# Patient Record
Sex: Female | Born: 1950
Health system: Southern US, Community
[De-identification: ages and names within clinical notes are randomized; demographics above are authoritative.]

## PROBLEM LIST (undated history)

## (undated) DIAGNOSIS — K449 Diaphragmatic hernia without obstruction or gangrene: Secondary | ICD-10-CM

## (undated) DIAGNOSIS — Z923 Personal history of irradiation: Secondary | ICD-10-CM

## (undated) DIAGNOSIS — K2970 Gastritis, unspecified, without bleeding: Secondary | ICD-10-CM

## (undated) DIAGNOSIS — E039 Hypothyroidism, unspecified: Secondary | ICD-10-CM

## (undated) DIAGNOSIS — G4730 Sleep apnea, unspecified: Secondary | ICD-10-CM

## (undated) DIAGNOSIS — D139 Benign neoplasm of ill-defined sites within the digestive system: Secondary | ICD-10-CM

## (undated) DIAGNOSIS — F32A Depression, unspecified: Secondary | ICD-10-CM

## (undated) DIAGNOSIS — N951 Menopausal and female climacteric states: Secondary | ICD-10-CM

## (undated) DIAGNOSIS — K5730 Diverticulosis of large intestine without perforation or abscess without bleeding: Secondary | ICD-10-CM

## (undated) DIAGNOSIS — F329 Major depressive disorder, single episode, unspecified: Secondary | ICD-10-CM

## (undated) DIAGNOSIS — E669 Obesity, unspecified: Secondary | ICD-10-CM

## (undated) DIAGNOSIS — J42 Unspecified chronic bronchitis: Secondary | ICD-10-CM

## (undated) DIAGNOSIS — K2270 Barrett's esophagus without dysplasia: Secondary | ICD-10-CM

## (undated) DIAGNOSIS — I10 Essential (primary) hypertension: Secondary | ICD-10-CM

## (undated) DIAGNOSIS — K589 Irritable bowel syndrome without diarrhea: Secondary | ICD-10-CM

## (undated) DIAGNOSIS — M8580 Other specified disorders of bone density and structure, unspecified site: Secondary | ICD-10-CM

## (undated) DIAGNOSIS — K219 Gastro-esophageal reflux disease without esophagitis: Secondary | ICD-10-CM

## (undated) DIAGNOSIS — E0500 Thyrotoxicosis with diffuse goiter without thyrotoxic crisis or storm: Secondary | ICD-10-CM

## (undated) HISTORY — DX: Other specified disorders of bone density and structure, unspecified site: M85.80

## (undated) HISTORY — DX: Obesity, unspecified: E66.9

## (undated) HISTORY — DX: Diaphragmatic hernia without obstruction or gangrene: K44.9

## (undated) HISTORY — DX: Menopausal and female climacteric states: N95.1

## (undated) HISTORY — DX: Irritable bowel syndrome, unspecified: K58.9

## (undated) HISTORY — PX: CHOLECYSTECTOMY: SHX55

## (undated) HISTORY — DX: Major depressive disorder, single episode, unspecified: F32.9

## (undated) HISTORY — DX: Essential (primary) hypertension: I10

## (undated) HISTORY — DX: Depression, unspecified: F32.A

## (undated) HISTORY — PX: TONSILLECTOMY: SUR1361

## (undated) HISTORY — PX: MOHS SURGERY: SHX181

## (undated) HISTORY — DX: Gastro-esophageal reflux disease without esophagitis: K21.9

## (undated) HISTORY — PX: ABDOMINAL HYSTERECTOMY: SHX81

## (undated) HISTORY — DX: Thyrotoxicosis with diffuse goiter without thyrotoxic crisis or storm: E05.00

## (undated) HISTORY — PX: INCONTINENCE SURGERY: SHX676

## (undated) HISTORY — DX: Sleep apnea, unspecified: G47.30

## (undated) HISTORY — DX: Hypothyroidism, unspecified: E03.9

## (undated) HISTORY — DX: Benign neoplasm of ill-defined sites within the digestive system: D13.9

## (undated) HISTORY — DX: Diverticulosis of large intestine without perforation or abscess without bleeding: K57.30

## (undated) HISTORY — DX: Barrett's esophagus without dysplasia: K22.70

## (undated) HISTORY — DX: Gastritis, unspecified, without bleeding: K29.70

---

## 1999-02-08 ENCOUNTER — Other Ambulatory Visit: Admission: RE | Admit: 1999-02-08 | Discharge: 1999-02-08 | Payer: Self-pay | Admitting: Gastroenterology

## 1999-02-08 ENCOUNTER — Encounter (INDEPENDENT_AMBULATORY_CARE_PROVIDER_SITE_OTHER): Payer: Self-pay

## 2000-04-02 ENCOUNTER — Other Ambulatory Visit: Admission: RE | Admit: 2000-04-02 | Discharge: 2000-04-02 | Payer: Self-pay | Admitting: Family Medicine

## 2000-05-18 ENCOUNTER — Encounter: Admission: RE | Admit: 2000-05-18 | Discharge: 2000-05-30 | Payer: Self-pay | Admitting: Family Medicine

## 2001-09-26 ENCOUNTER — Encounter: Payer: Self-pay | Admitting: Urology

## 2001-09-26 ENCOUNTER — Encounter: Admission: RE | Admit: 2001-09-26 | Discharge: 2001-09-26 | Payer: Self-pay | Admitting: Urology

## 2001-10-01 ENCOUNTER — Ambulatory Visit (HOSPITAL_BASED_OUTPATIENT_CLINIC_OR_DEPARTMENT_OTHER): Admission: RE | Admit: 2001-10-01 | Discharge: 2001-10-01 | Payer: Self-pay | Admitting: Urology

## 2002-02-26 ENCOUNTER — Other Ambulatory Visit: Admission: RE | Admit: 2002-02-26 | Discharge: 2002-02-26 | Payer: Self-pay | Admitting: Family Medicine

## 2003-01-29 ENCOUNTER — Encounter (HOSPITAL_COMMUNITY): Admission: RE | Admit: 2003-01-29 | Discharge: 2003-04-29 | Payer: Self-pay | Admitting: Endocrinology

## 2007-04-16 ENCOUNTER — Ambulatory Visit: Payer: Self-pay | Admitting: Gastroenterology

## 2007-04-16 LAB — CONVERTED CEMR LAB
AST: 22 units/L (ref 0–37)
Albumin: 3.6 g/dL (ref 3.5–5.2)
BUN: 9 mg/dL (ref 6–23)
Basophils Absolute: 0.1 10*3/uL (ref 0.0–0.1)
Calcium: 9.8 mg/dL (ref 8.4–10.5)
Creatinine, Ser: 1.1 mg/dL (ref 0.4–1.2)
Eosinophils Absolute: 0.4 10*3/uL (ref 0.0–0.6)
Ferritin: 16.8 ng/mL (ref 10.0–291.0)
Folate: 15.2 ng/mL
GFR calc Af Amer: 66 mL/min
GFR calc non Af Amer: 54 mL/min
HCT: 40.8 % (ref 36.0–46.0)
Hemoglobin: 13.2 g/dL (ref 12.0–15.0)
Iron: 51 ug/dL (ref 42–145)
MCHC: 32.4 g/dL (ref 30.0–36.0)
Monocytes Absolute: 0.6 10*3/uL (ref 0.2–0.7)
Neutro Abs: 4.5 10*3/uL (ref 1.4–7.7)
Platelets: 377 10*3/uL (ref 150–400)
RDW: 13.4 % (ref 11.5–14.6)
Saturation Ratios: 13.5 % — ABNORMAL LOW (ref 20.0–50.0)
Total Bilirubin: 0.5 mg/dL (ref 0.3–1.2)
Transferrin: 270.1 mg/dL (ref >=212.0)
Vitamin B-12: 97 pg/mL — ABNORMAL LOW (ref 211–911)

## 2007-04-29 ENCOUNTER — Ambulatory Visit: Payer: Self-pay | Admitting: Gastroenterology

## 2007-04-29 ENCOUNTER — Encounter: Payer: Self-pay | Admitting: Gastroenterology

## 2007-04-29 DIAGNOSIS — D139 Benign neoplasm of ill-defined sites within the digestive system: Secondary | ICD-10-CM

## 2007-04-29 DIAGNOSIS — D1399 Benign neoplasm of ill-defined sites within the digestive system: Secondary | ICD-10-CM

## 2007-04-29 HISTORY — DX: Benign neoplasm of ill-defined sites within the digestive system: D13.99

## 2007-04-29 HISTORY — DX: Benign neoplasm of ill-defined sites within the digestive system: D13.9

## 2010-06-07 NOTE — Assessment & Plan Note (Signed)
[facility]                         GASTROENTEROLOGY OFFICE NOTE   HA, SHANNAHAN                         MRN:          [id number]  DATE:04/16/2007                            DOB:          1950/05/15    Mrs. Artist is a 60 year old African American female referred for  evaluation of chronic reflux symptoms, constipation predominant  irritable bowel syndrome.   I have known Mrs. Paris for many years and she last had colonoscopy in  2001, which was unremarkable.  She has had chronic IBS with alternating  diarrhea and constipation, constipation predominant.  She has also had  chronic GERD and a history of Barrett's mucosa with a small focus of  dysplasia in 1998.  She has been on chronic PPI therapy, but is not  taking these at this time for reasons which were unclear.  Last  endoscopic exam was in August of 2003 and was fairly unremarkable.  She  did have a small hyperplastic gastric polyp removed at that time.   She complains of some gas and bloating, and constipation followed by  diarrhea after several days.  She really has minimal abdominal pain and  denies nausea, vomiting, anorexia or weight loss.  She has rather  typical acid reflux both daytime and nighttime with burning and  regurgitation, but denies true dysphagia.  She is status post  cholecystectomy.  She denies any specific food intolerances,  particularly lactose.   Another problem today is what sounds like proctalgia fugax with  recurrent, rather severe, rectal pain, which wakes her up from sleep at  night.  This is very seldom, but is rather severe.   PAST MEDICAL HISTORY:  Remarkable for hypertension, chronic thyroid  dysfunction, degenerative Arthritis, chronic anxiety syndrome, sleep  apnea.  She has had previous cholecystectomy, hysterectomy and tubal  ligation.   MEDICATIONS:  1. Atenolol 25 mg daily.  2. Hydrochlorothiazide 25 mg daily.  3. Aspirin 81 mg daily.  4. Vitamin  D daily.  5. Omega 3 daily.  6. She does use p.r.n. lorazepam 0.5 mg.  7. Aleve.   She denies drug allergies.   FAMILY HISTORY:  Noncontributory in terms of no gastrointestinal  problems.   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  She  does not smoke or abuse ethanol.   REVIEW OF SYSTEMS:  Noncontributory without current cardiovascular,  pulmonary, genitourinary, or neuropsychiatric problems.   EXAMINATION:  Shows her to be a health appearing female, appearing her  stated age, in no acute distress.  She is 5 feet 3 inches and weighs 206  pounds.  Blood pressure is 102/66 and pulse was 64, regular.  I could  not appreciate the stigmata of chronic liver disease, thyromegaly or  lymphadenopathy.  Her chest was entirely clear.  She was in a regular  rhythm without murmurs, gallops or rubs.  There was no organomegaly,  abdominal masses or tenderness.  Bowel sounds were normal.  Peripheral  extremities were unremarkable.  Inspection of the rectum  was  unremarkable as was rectal exam.  I could not appreciate any rectal  fissures or fistulae.  There was soft stool present that was guaiac  negative.   ASSESSMENT:  1. Chronic gastroesophageal reflux disease with history of possible      Barrett's mucosa in her esophagus with need for followup endoscopy.  2. Status post cholecystectomy.  3. Constipation predominant irritable bowel syndrome.  4. Hypertensive cardiovascular disease.  5. History of chronic thyroid disorder not on thyroid replacement      therapy.  6. Status post hysterectomy.   RECOMMENDATIONS:  1. Reflux regime and I have restarted Protonix 40 mg 30 minutes before      breakfast each morning.  2. Followup endoscopy and colonoscopy.  3. Benefiber 1 tablespoon with cereal in the morning and liberal p.o.      fluids.  4. Consider trial of Amitiza 8 mcg twice daily with  meals.  5. Continue other medications per Dr. Tania Ade.   ADDENDUM  Lab test on March 30, 2006 showed a normal CBC, metabolic profile, and  thyroid function tests.     Vania Rea. Jarold Motto, MD, Caleen Essex, FAGA  Electronically Signed    DRP/MedQ  DD: 04/16/2007  DT: 04/16/2007  Job #: [id number]   cc:   Belva Agee, Dr.

## 2010-06-10 NOTE — Op Note (Signed)
TNAME:  REDDAmir, Glaus                           ACCOUNT NO.:  [id number]   MEDICAL RECORD NO.:  [id number]                   PATIENT TYPE:  AMB   LOCATION:  [facility]                                 FACILITY:  [facility]   PHYSICIAN:  Excell Seltzer. Annabell Howells, M.D.                 DATE OF BIRTH:  [birth year]   DATE OF PROCEDURE:  10/01/2001  DATE OF DISCHARGE:                                 OPERATIVE REPORT   PROCEDURE:  SPARC sling.   PREOPERATIVE DIAGNOSIS:  Stress incontinence.   POSTOPERATIVE DIAGNOSIS:  Stress incontinence.   SURGEON:  Excell Seltzer. Annabell Howells, M.D.   ANESTHESIA:  General.   DRAINS:  Foley.   COMPLICATIONS:  None.   INDICATIONS:  The patient is a 60 year old black female with stress urinary  incontinence, who has elected to undergo a SPARC sling.   FINDINGS AND PROCEDURE:  The patient was given p.o. Tequin.  She was taken  to the operating room where a general anesthetic was induced.  She was  placed in lithotomy position.  Her mons was shaved.  She was prepped with  Betadine solution, and she was draped in the usual sterile fashion.  A  weighted vaginal retractor was placed, Foley catheter inserted, and the  bladder was drained.  The anterior vaginal wall was infiltrated with  approximately 5 cc of 1% lidocaine with epinephrine.  Two 1.5 cm incisions  were made just lateral to the midline over the pubis.  The fat was spread to  the fascia with a hemostat, and a sponge was placed over these incisions.  An anterior vaginal wall incision was made over the mid urethral level with  a knife.  The vaginal mucosa was elevated off the pubourethral fascia  laterally for 2 cm in length using Strulle scissors.  Once the dissection  had been completed in this area, the [facility] trocars were brought onto the  field.  The first was passed on the right from the abdominal incision.  The  tip of the trocar was passed through the fascia down onto the pubis.  It was  walked along the back of the pubis  until the tip could be palpated with a  finger in the right vaginal incision that was used to hold the urethra to  the contralateral side.  Another trocar was then passed in the vaginal  incision.  This was then repeated in identical fashion on the left.  At this  point, cystoscopy was performed.  Inspection of the bladder revealed no  evidence of bladder wall injury or trocar passage through the bladder wall  or urethra.  There was a small wide-mouth diverticulum at the proximal  urethra.  The patient had a remote history of urethral diverticulectomy.  There was no evidence of urethral surgical injury.  At this point, the  bladder was drained.  The [facility] mesh was then snapped to the trocars  and  drawn into the abdominal incisions.  Recystoscopy confirmed no presence of  the mesh within the bladder.  The bladder was left full.  The cystoscope was  removed.  The sheath was removed from the right side; then the tension  adjustments were made until appropriate tension was obtained by noticing a  very slight deviation in the urinary stream with pressure on the bladder.  This left sheath was then removed.  The position was adjusted once again to  ensure proper tension, and Foley catheter was then inserted.  With the Foley  in place, a small right-angle clamp could easily pass between the mesh and  the urethra.  At this point, the anterior vaginal wall was closed with a  running lock 2-0 Vicryl.  A two-inch Iodoform vaginal pack was placed.  The  Foley catheter was placed on straight  drainage.  The abdominal incisions were closed with tincture of Benzoin and  Steri-Strips after trimming the long ends of the mesh material.  At this  point, the patient was taken down from lithotomy position.  Her anesthetic  was reversed.  She was moved to the recovery room in stable condition.  There were no complications.                                               Excell Seltzer. Annabell Howells, M.D.    JJW/MEDQ  D:   10/01/2001  T:  10/01/2001  Job:  [id number]   cc:   [facility]

## 2010-08-25 ENCOUNTER — Other Ambulatory Visit (INDEPENDENT_AMBULATORY_CARE_PROVIDER_SITE_OTHER): Payer: 59

## 2010-08-25 ENCOUNTER — Encounter: Payer: Self-pay | Admitting: Gastroenterology

## 2010-08-25 ENCOUNTER — Ambulatory Visit (INDEPENDENT_AMBULATORY_CARE_PROVIDER_SITE_OTHER): Payer: 59 | Admitting: Gastroenterology

## 2010-08-25 DIAGNOSIS — K589 Irritable bowel syndrome without diarrhea: Secondary | ICD-10-CM

## 2010-08-25 DIAGNOSIS — R109 Unspecified abdominal pain: Secondary | ICD-10-CM

## 2010-08-25 DIAGNOSIS — K591 Functional diarrhea: Secondary | ICD-10-CM

## 2010-08-25 DIAGNOSIS — K219 Gastro-esophageal reflux disease without esophagitis: Secondary | ICD-10-CM

## 2010-08-25 LAB — IBC PANEL
Saturation Ratios: 13 % — ABNORMAL LOW (ref 20.0–50.0)
Transferrin: 275 mg/dL (ref 212.0–360.0)

## 2010-08-25 LAB — FOLATE: Folate: 23.4 ng/mL (ref >5.9)

## 2010-08-25 LAB — SEDIMENTATION RATE: Sed Rate: 18 mm/hr (ref 0–22)

## 2010-08-25 MED ORDER — PEG-KCL-NACL-NASULF-NA ASC-C 100 G PO SOLR: 1.0000 | Freq: Once | ORAL | Status: DC

## 2010-08-25 MED ORDER — HYOSCYAMINE SULFATE 0.125 MG SL SUBL: 0.1250 mg | SUBLINGUAL_TABLET | SUBLINGUAL | Status: DC | PRN

## 2010-08-25 NOTE — Patient Instructions (Signed)
Your procedure has been scheduled for 09/16/2010, please follow the seperate instructions.  Please go to the basement today for your labs.  Your prescription(s) have been sent to you pharmacy.  You were given a handout on artificial sweeteners to avoid please follow.

## 2010-08-25 NOTE — Progress Notes (Signed)
History of Present Illness:  This is a very pleasant 60 year old [place] American female referred through the courtesy of Birdena Jubilee FNP At [facility]. This patient has a long history of IBS with previous negative GI workups except for evidence of B12 deficiency. Most of her problems have included abdominal gas, bloating, and periodic diarrhea, also past history of proctalgia fugax. She is status post hysterectomy, and has a history of sleep apnea and chronic  anxiety syndrome.  She now relates chronic slow transit problems with a vague  nonspecific  sensation throughout her gut after meals, with periodic attacks of severe abdominal cramping, diaphoresis, flushing, followed by diarrhea. She has not noticed melena, hematochezia, other GI or systemic complaints  except for chronic low back pain. Initially was felt that she was using too much NSAID's, and these were discontinued. She does use a fair amount of nonabsorbable carbohydrates with diet chewing gum. She possibly has lactose intolerance. Apparently she has had extensive allergy testing and relates that she is" allergic to wheat.". Previous small bowel biopsy showed no evidence of celiac disease. Patient also is noted intolerance to fried greasy foods and large amounts of fiber. She takes Nexium 40 mg  mg a day for chronic GERD, and generally has well controlled acid reflux symptoms. Other problems include hypertension and hyperlipidemia. Recent lab data showed normal metabolic profile and liver function tests. Patient denies a history of pancreatitis or hepatitis. She is status post cholecystectomy. ERCP in 1986 was unremarkable.  I have reviewed this patient's present history, medical and surgical past history, allergies and medications.     ROS: Chronic low back pain. She denies any current cardiovascular or pulmonary or other general medical problems. The remainder of the 10 point ROS is negative  Past Medical History    Diagnosis Date  . IBS (irritable bowel syndrome)   . 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 Hiatal hernia   . Esophageal reflux   . Essential hypertension, benign   . Depressive disorder, not elsewhere classified   . Obesity   . Barrett esophagus   . Grave's disease   . Hypothyroidism   . Osteopenia   . Symptomatic menopausal or female climacteric states   . Sleep apnea    Past Surgical History  Procedure Date  . Abdominal hysterectomy   . Tonsillectomy   . Cholecystectomy     reports that she has quit smoking. She does not have any smokeless tobacco history on file. She reports that she does not drink alcohol or use illicit drugs. family history includes Breast cancer in an unspecified family member; Clotting disorder in an unspecified family member; Diabetes in an unspecified family member; and Lung cancer in her father.  There is no history of Colon cancer. No Known Allergies      Physical Exam: General well developed well nourished patient in no acute distress, appearing her stated age Eyes PERRLA, no icterus, fundoscopic exam per opthamologist Skin no lesions noted Neck supple, no adenopathy, no thyroid enlargement, no tenderness Chest clear to percussion and auscultation Heart no significant murmurs, gallops or rubs noted Abdomen no hepatosplenomegaly masses or tenderness, BS normal.  Rectal inspection normal no fissures, or fistulae noted.  No masses or tenderness on digital exam. Stool guaiac negative. Extremities no acute joint lesions, edema, phlebitis or evidence of cellulitis. Neurologic patient oriented x 3, cranial nerves intact, no focal neurologic deficits noted. Psychological mental status normal and normal affect.  Assessment and plan: Diarrhea predominant IBS, rule out chronic malabsorption, rule out bile salt enteropathy associated with previous cholecystectomy. I've ordered a celiac serology, followup endoscopy  with small bowel biopsy and biopsies to exclude H. pylori, and we'll do followup colonoscopy with random biopsies to exclude microscopic or collagenous colitis. He may have lactose intolerance, and I've asked her to avoid sorbitol/ fructose in her diet. She is to continue medications as listed and reviewed her record. A prescription for when necessary sublingual Levsin 0.125 mg has been prescribed. Anemia profile ordered and she may need to begin parenteral replacement of B12.  Encounter Diagnoses  Name Primary?  . Abdominal pain   . IBS (irritable bowel syndrome)

## 2010-08-30 LAB — CAROTENE, SERUM: Carotene, Total-Serum: 29 ug/dL (ref 6–77)

## 2010-08-31 ENCOUNTER — Encounter: Payer: Self-pay | Admitting: Gastroenterology

## 2010-08-31 ENCOUNTER — Telehealth: Payer: Self-pay | Admitting: Gastroenterology

## 2010-08-31 NOTE — Telephone Encounter (Signed)
Pt forgot to tell us she has had a bladder sling. She wanted [place] to know in case it could affect her ECL. Informed pt I will let Dr Jarold Motto know and add it to her chart.

## 2010-09-16 ENCOUNTER — Encounter: Payer: 59 | Admitting: Gastroenterology

## 2010-10-07 ENCOUNTER — Encounter: Payer: 59 | Admitting: Gastroenterology

## 2010-10-17 ENCOUNTER — Encounter: Payer: 59 | Admitting: Gastroenterology

## 2010-11-30 ENCOUNTER — Encounter: Payer: Self-pay | Admitting: Gastroenterology

## 2010-11-30 ENCOUNTER — Ambulatory Visit (AMBULATORY_SURGERY_CENTER): Payer: 59 | Admitting: Gastroenterology

## 2010-11-30 DIAGNOSIS — K591 Functional diarrhea: Secondary | ICD-10-CM

## 2010-11-30 DIAGNOSIS — K219 Gastro-esophageal reflux disease without esophagitis: Secondary | ICD-10-CM

## 2010-11-30 DIAGNOSIS — K589 Irritable bowel syndrome without diarrhea: Secondary | ICD-10-CM

## 2010-11-30 DIAGNOSIS — R109 Unspecified abdominal pain: Secondary | ICD-10-CM

## 2010-11-30 DIAGNOSIS — Z1211 Encounter for screening for malignant neoplasm of colon: Secondary | ICD-10-CM

## 2010-11-30 DIAGNOSIS — K2940 Chronic atrophic gastritis without bleeding: Secondary | ICD-10-CM

## 2010-11-30 MED ORDER — SODIUM CHLORIDE 0.9 % IV SOLN: 500.0000 mL | INTRAVENOUS | Status: DC

## 2010-11-30 NOTE — Patient Instructions (Signed)
Please review discharge instructions (green and blue sheet)  Await pathology  Resume your normal medications

## 2010-12-01 ENCOUNTER — Telehealth: Payer: Self-pay | Admitting: *Deleted

## 2010-12-01 NOTE — Telephone Encounter (Signed)

## 2010-12-05 ENCOUNTER — Encounter: Payer: Self-pay | Admitting: Gastroenterology

## 2010-12-06 ENCOUNTER — Encounter: Payer: Self-pay | Admitting: Gastroenterology

## 2010-12-29 ENCOUNTER — Encounter: Payer: Self-pay | Admitting: *Deleted

## 2010-12-30 ENCOUNTER — Encounter: Payer: Self-pay | Admitting: Gastroenterology

## 2010-12-30 ENCOUNTER — Ambulatory Visit (INDEPENDENT_AMBULATORY_CARE_PROVIDER_SITE_OTHER): Payer: 59 | Admitting: Gastroenterology

## 2010-12-30 VITALS — BP 126/64 | HR 60 | Ht 66.0 in | Wt 194.0 lb

## 2010-12-30 DIAGNOSIS — K589 Irritable bowel syndrome without diarrhea: Secondary | ICD-10-CM

## 2010-12-30 DIAGNOSIS — K2950 Unspecified chronic gastritis without bleeding: Secondary | ICD-10-CM | POA: Insufficient documentation

## 2010-12-30 DIAGNOSIS — K5730 Diverticulosis of large intestine without perforation or abscess without bleeding: Secondary | ICD-10-CM | POA: Insufficient documentation

## 2010-12-30 MED ORDER — CILIDINIUM-CHLORDIAZEPOXIDE 2.5-5 MG PO CAPS: 1.0000 | ORAL_CAPSULE | Freq: Three times a day (TID) | ORAL | Status: DC

## 2010-12-30 NOTE — Patient Instructions (Signed)
Take Librax one tablet by mouth every 6-8 hours.  Follow the IBS diet given.

## 2010-12-30 NOTE — Progress Notes (Signed)
History of Present Illness: This is a extremely pleasant 60 year old African American female with diarrhea predominant IBS. Recent colonoscopy showed diverticulosis coli but otherwise was unremarkable, and multiple random colon biopsies showed no evidence of microscopic or collagenous colitis. Upper endoscopy was unremarkable except for some mild gastritis with a negative evaluation for celiac disease and H. pylori. Her diarrhea is much improved him up but she continues with periodic crampy abdominal pain which seems mostly stress related. She does not use by mouth lactose products. The patient continues on Nexium 40 mg a day with good control of her dyspeptic symptoms. Review of her lab data otherwise was unremarkable.      Current Medications, Allergies, Past Medical History, Past Surgical History, Family History and Social History were reviewed in [employer] record.   Assessment and plan: Diverticulosis coli with an element of stress related diarrhea. I have switched her to by mouth when necessary Librax every 6-8 hours as tolerated. We will continue her Nexium with when necessary GI followup as needed. She will continue primary care followup with Dr. Rudi Heap.   Please copy her primary care physician, referring physician, and pertinent subspecialists. Encounter Diagnosis  Name Primary?  . IBS (irritable bowel syndrome) Yes

## 2012-02-05 ENCOUNTER — Emergency Department (HOSPITAL_COMMUNITY): Payer: 59

## 2012-02-05 ENCOUNTER — Encounter (HOSPITAL_COMMUNITY): Payer: Self-pay | Admitting: Radiology

## 2012-02-05 ENCOUNTER — Emergency Department (HOSPITAL_COMMUNITY)
Admission: EM | Admit: 2012-02-05 | Discharge: 2012-02-05 | Disposition: A | Discharge status: Home / Self Care | Payer: 59 | Attending: Emergency Medicine | Admitting: Emergency Medicine

## 2012-02-05 DIAGNOSIS — R5381 Other malaise: Secondary | ICD-10-CM | POA: Insufficient documentation

## 2012-02-05 DIAGNOSIS — Z862 Personal history of diseases of the blood and blood-forming organs and certain disorders involving the immune mechanism: Secondary | ICD-10-CM | POA: Insufficient documentation

## 2012-02-05 DIAGNOSIS — Z8719 Personal history of other diseases of the digestive system: Secondary | ICD-10-CM | POA: Insufficient documentation

## 2012-02-05 DIAGNOSIS — Z7982 Long term (current) use of aspirin: Secondary | ICD-10-CM | POA: Insufficient documentation

## 2012-02-05 DIAGNOSIS — Z8639 Personal history of other endocrine, nutritional and metabolic disease: Secondary | ICD-10-CM | POA: Insufficient documentation

## 2012-02-05 DIAGNOSIS — R05 Cough: Secondary | ICD-10-CM | POA: Insufficient documentation

## 2012-02-05 DIAGNOSIS — I10 Essential (primary) hypertension: Secondary | ICD-10-CM | POA: Insufficient documentation

## 2012-02-05 DIAGNOSIS — R110 Nausea: Secondary | ICD-10-CM | POA: Insufficient documentation

## 2012-02-05 DIAGNOSIS — R0789 Other chest pain: Secondary | ICD-10-CM

## 2012-02-05 DIAGNOSIS — R0602 Shortness of breath: Secondary | ICD-10-CM | POA: Insufficient documentation

## 2012-02-05 DIAGNOSIS — Z87891 Personal history of nicotine dependence: Secondary | ICD-10-CM | POA: Insufficient documentation

## 2012-02-05 DIAGNOSIS — E669 Obesity, unspecified: Secondary | ICD-10-CM | POA: Insufficient documentation

## 2012-02-05 DIAGNOSIS — Z8739 Personal history of other diseases of the musculoskeletal system and connective tissue: Secondary | ICD-10-CM | POA: Insufficient documentation

## 2012-02-05 DIAGNOSIS — K219 Gastro-esophageal reflux disease without esophagitis: Secondary | ICD-10-CM | POA: Insufficient documentation

## 2012-02-05 DIAGNOSIS — R5383 Other fatigue: Secondary | ICD-10-CM | POA: Insufficient documentation

## 2012-02-05 DIAGNOSIS — R059 Cough, unspecified: Secondary | ICD-10-CM | POA: Insufficient documentation

## 2012-02-05 DIAGNOSIS — Z8742 Personal history of other diseases of the female genital tract: Secondary | ICD-10-CM | POA: Insufficient documentation

## 2012-02-05 DIAGNOSIS — Z79899 Other long term (current) drug therapy: Secondary | ICD-10-CM | POA: Insufficient documentation

## 2012-02-05 LAB — TROPONIN I: Troponin I: <0.3 ng/mL (ref <0.30)

## 2012-02-05 LAB — CBC WITH DIFFERENTIAL/PLATELET
Eosinophils Absolute: 0.4 10*3/uL (ref 0.0–0.7)
Eosinophils Relative: 4 % (ref 0–5)
Hemoglobin: 13.9 g/dL (ref 12.0–15.0)
Lymphs Abs: 3.7 10*3/uL (ref 0.7–4.0)
MCH: 28 pg (ref 26.0–34.0)
MCV: 84.9 fL (ref 78.0–100.0)
Monocytes Absolute: 0.7 10*3/uL (ref 0.1–1.0)
Monocytes Relative: 5 % (ref 3–12)
RBC: 4.97 MIL/uL (ref 3.87–5.11)

## 2012-02-05 LAB — LIPASE, BLOOD: Lipase: 39 U/L (ref 11–59)

## 2012-02-05 LAB — COMPREHENSIVE METABOLIC PANEL
BUN: 11 mg/dL (ref 6–23)
Calcium: 10.2 mg/dL (ref 8.4–10.5)
GFR calc Af Amer: 75 mL/min — ABNORMAL LOW (ref >90)
Glucose, Bld: 99 mg/dL (ref 70–99)
Total Protein: 7.7 g/dL (ref 6.0–8.3)

## 2012-02-05 MED ORDER — NITROGLYCERIN 0.4 MG SL SUBL: 0.4000 mg | SUBLINGUAL_TABLET | SUBLINGUAL | Status: DC | PRN

## 2012-02-05 MED ORDER — GI COCKTAIL ~~LOC~~
30.0000 mL | Freq: Once | ORAL | Status: AC
  Administered 2012-02-05: 30 mL via ORAL
  Filled 2012-02-05: qty 30

## 2012-02-05 MED ORDER — IOHEXOL 350 MG/ML SOLN
100.0000 mL | Freq: Once | INTRAVENOUS | Status: AC | PRN
Start: 2012-02-05 — End: 2012-02-05
  Administered 2012-02-05: 100 mL via INTRAVENOUS

## 2012-02-05 NOTE — ED Provider Notes (Signed)
History     CSN: [id number]  Arrival date & time 02/05/12  4098   First MD Initiated Contact with Patient 02/05/12 1835      Chief Complaint  Patient presents with  . Chest Pain    (Consider location/radiation/quality/duration/timing/severity/associated sxs/prior treatment) HPI  Past Medical History  Diagnosis Date  . IBS (irritable bowel syndrome)   . 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 Hiatal hernia   . Esophageal reflux   . Essential hypertension, benign   . Depressive disorder, not elsewhere classified   . Obesity   . Barrett esophagus   . Grave's disease   . Hypothyroidism   . Osteopenia   . Symptomatic menopausal or female climacteric states   . Sleep apnea   . Diverticulosis of colon (without mention of hemorrhage)   . Gastritis     Past Surgical History  Procedure Date  . Abdominal hysterectomy   . Tonsillectomy   . Cholecystectomy   . Incontinence surgery     Family History  Problem Relation Age of Onset  . Diabetes    . Colon cancer Neg Hx   . Clotting disorder    . Breast cancer      aunt  . Lung cancer Father     History  Substance Use Topics  . Smoking status: Former Games developer  . Smokeless tobacco: Never Used  . Alcohol Use: No    OB History    Grav Para Term Preterm Abortions TAB SAB Ect Mult Living                  Review of Systems  Allergies  Adhesive  Home Medications   Current Outpatient Rx  Name  Route  Sig  Dispense  Refill  . ALBUTEROL SULFATE HFA 108 (90 BASE) MCG/ACT IN AERS   Inhalation   Inhale 2 puffs into the lungs every 6 (six) hours as needed. For wheeze/shortness of breath         . AMOXICILLIN 875 MG PO TABS   Oral   Take 875 mg by mouth 2 (two) times daily.         . ASPIRIN 81 MG PO TABS   Oral   Take 81 mg by mouth daily.           . ATENOLOL 25 MG PO TABS   Oral   Take 25 mg by mouth daily.          . OMEGA-3 FATTY ACIDS 1000 MG PO CAPS  Oral   Take 2 g by mouth daily.           Marland Kitchen HYDROCHLOROTHIAZIDE 12.5 MG PO CAPS   Oral   Take 12.5 mg by mouth daily.          Marland Kitchen HYDROCODONE-HOMATROPINE 5-1.5 MG/5ML PO SYRP   Oral   Take 5 mLs by mouth every 6 (six) hours as needed. For cough         . ONE-DAILY MULTI VITAMINS PO TABS   Oral   Take 1 tablet by mouth daily.           Marland Kitchen HYOSCYAMINE SULFATE 0.125 MG SL SUBL   Sublingual   Place 1 tablet (0.125 mg total) under the tongue every 4 (four) hours as needed for cramping.   90 tablet   6     BP 127/94  Pulse 60  SpO2 100%  Physical Exam  ED Course  Procedures (including critical care  time)  Labs Reviewed  CBC WITH DIFFERENTIAL - Abnormal; Notable for the following:    WBC 12.2 (*)     All other components within normal limits  COMPREHENSIVE METABOLIC PANEL - Abnormal; Notable for the following:    GFR calc non Af Amer 65 (*)     GFR calc Af Amer 75 (*)     All other components within normal limits  LIPASE, BLOOD  D-DIMER, QUANTITATIVE  TROPONIN I  URINALYSIS, ROUTINE W REFLEX MICROSCOPIC   Dg Chest 2 View  02/05/2012  *RADIOLOGY REPORT*  Clinical Data: Right-sided chest pain radiating down right arm  CHEST - 2 VIEW  Comparison: None.  Findings: Normal cardiac silhouette and mediastinal contours. Bilateral punctate calcified granulomas and possibly partially calcified bilateral hilar lymph nodes are favored to be sequela of prior granulomas infection.  Minimal left basilar linear heterogeneous opacities favored to represent atelectasis.  No focal airspace opacities.  No pleural effusion or pneumothorax.  No acute osseous abnormalities.  Post cholecystectomy.  IMPRESSION: Sequela of prior granulomatous infection without acute cardiopulmonary disease.   Original Report Authenticated By: Tacey Ruiz, MD    Ct Angio Chest Aorta W/cm &/or Wo/cm  02/05/2012  *RADIOLOGY REPORT*  Clinical Data:  Right-sided chest pain, onset today, with right arm tingling.   History of hypertension.  CT ANGIOGRAPHY CHEST, ABDOMEN AND PELVIS  Technique:  Multidetector CT imaging through the chest, abdomen and pelvis was performed using the standard protocol during bolus administration of intravenous contrast.  Multiplanar reconstructed images including MIPs were obtained and reviewed to evaluate the vascular anatomy.  Contrast: OMNIPAQUE IOHEXOL 350 MG/ML SOLN  Comparison:   None.  CTA CHEST  Findings:  Initial unenhanced images of the chest are obtained. There is scattered calcification in the thoracic aorta.  Mild calcification in the coronary arteries.  No evidence of intramural thrombus.  CTA images obtained after contrast administration demonstrate normal caliber thoracic aorta.  No evidence of dissection.  There is mild mural thrombus and mural calcification noted in the aortic arch and descending thoracic aorta.  No penetrating ulcers are demonstrated.  Typical branching pattern of the great vessels. Visualized central and segmental pulmonary arteries demonstrate no filling defects.  No evidence of significant pulmonary embolus.  Normal heart size.  No significant lymphadenopathy in the chest. Scattered calcified granulomas are demonstrated.  The esophagus is decompressed.  The airways appear patent.  There is mild atelectasis in the lung bases.  No definite consolidation.  No pleural effusions.  No pneumothorax.  Normal alignment of the thoracic spine.  No destructive bone lesions appreciated.   Review of the MIP images confirms the above findings.  IMPRESSION: Calcification and mild mural thrombus in the aorta.  No evidence of aneurysm, dissection, penetrating ulcer, or intramural hematoma. No evidence of active pulmonary disease.  No significant pulmonary emboli.  CTA ABDOMEN AND PELVIS  Findings:  Normal caliber abdominal aorta.  No evidence of aneurysm or dissection.  No significant atherosclerotic changes.  The celiac axis, superior mesenteric artery, bilateral single  renal arteries, inferior mesenteric artery, and iliac arteries are patent. Bilateral external iliac and common femoral arteries are patent. No retroperitoneal fluid collections.  Small esophageal hiatal hernia.  Surgical absence of the gallbladder.  The liver, spleen, pancreas, adrenal glands, kidneys, and retroperitoneal lymph nodes are unremarkable.  Mild prominence of extrahepatic bile ducts is likely physiologic in the postcholecystectomy state.  The stomach, small bowel, and colon are decompressed.  No free air or free  fluid in the abdomen.  Pelvis:  The bladder is decompressed.  The uterus appears to be surgically absent.  No abnormal adnexal masses.  No free or loculated pelvic fluid collections.  No significant pelvic lymphadenopathy.  Normal alignment of the lumbar spine.  No destructive bone lesions appreciated.   Review of the MIP images confirms the above findings.  IMPRESSION: No evidence of aneurysm or dissection of the abdominal aorta. Major branch vessels appear patent.   Original Report Authenticated By: Burman Nieves, M.D.    Ct Cta Abd/pel W/cm &/or W/o Cm  02/05/2012  *RADIOLOGY REPORT*  Clinical Data:  Right-sided chest pain, onset today, with right arm tingling.  History of hypertension.  CT ANGIOGRAPHY CHEST, ABDOMEN AND PELVIS  Technique:  Multidetector CT imaging through the chest, abdomen and pelvis was performed using the standard protocol during bolus administration of intravenous contrast.  Multiplanar reconstructed images including MIPs were obtained and reviewed to evaluate the vascular anatomy.  Contrast: OMNIPAQUE IOHEXOL 350 MG/ML SOLN  Comparison:   None.  CTA CHEST  Findings:  Initial unenhanced images of the chest are obtained. There is scattered calcification in the thoracic aorta.  Mild calcification in the coronary arteries.  No evidence of intramural thrombus.  CTA images obtained after contrast administration demonstrate normal caliber thoracic aorta.  No evidence  of dissection.  There is mild mural thrombus and mural calcification noted in the aortic arch and descending thoracic aorta.  No penetrating ulcers are demonstrated.  Typical branching pattern of the great vessels. Visualized central and segmental pulmonary arteries demonstrate no filling defects.  No evidence of significant pulmonary embolus.  Normal heart size.  No significant lymphadenopathy in the chest. Scattered calcified granulomas are demonstrated.  The esophagus is decompressed.  The airways appear patent.  There is mild atelectasis in the lung bases.  No definite consolidation.  No pleural effusions.  No pneumothorax.  Normal alignment of the thoracic spine.  No destructive bone lesions appreciated.   Review of the MIP images confirms the above findings.  IMPRESSION: Calcification and mild mural thrombus in the aorta.  No evidence of aneurysm, dissection, penetrating ulcer, or intramural hematoma. No evidence of active pulmonary disease.  No significant pulmonary emboli.  CTA ABDOMEN AND PELVIS  Findings:  Normal caliber abdominal aorta.  No evidence of aneurysm or dissection.  No significant atherosclerotic changes.  The celiac axis, superior mesenteric artery, bilateral single renal arteries, inferior mesenteric artery, and iliac arteries are patent. Bilateral external iliac and common femoral arteries are patent. No retroperitoneal fluid collections.  Small esophageal hiatal hernia.  Surgical absence of the gallbladder.  The liver, spleen, pancreas, adrenal glands, kidneys, and retroperitoneal lymph nodes are unremarkable.  Mild prominence of extrahepatic bile ducts is likely physiologic in the postcholecystectomy state.  The stomach, small bowel, and colon are decompressed.  No free air or free fluid in the abdomen.  Pelvis:  The bladder is decompressed.  The uterus appears to be surgically absent.  No abnormal adnexal masses.  No free or loculated pelvic fluid collections.  No significant pelvic  lymphadenopathy.  Normal alignment of the lumbar spine.  No destructive bone lesions appreciated.   Review of the MIP images confirms the above findings.  IMPRESSION: No evidence of aneurysm or dissection of the abdominal aorta. Major branch vessels appear patent.   Original Report Authenticated By: Burman Nieves, M.D.      1. Chest pain, atypical       MDM  Discussed CT Scan  with [facility]-- nothing to do for the thrombus at this time --observe over time         Arman Filter, NP 02/05/12 2315

## 2012-02-05 NOTE — ED Provider Notes (Signed)
History     CSN: [id number]  Arrival date & time 02/05/12  4098   First MD Initiated Contact with Patient 02/05/12 1835      Chief Complaint  Patient presents with  . Chest Pain    (Consider location/radiation/quality/duration/timing/severity/associated sxs/prior treatment) Patient is a 62 y.o. female presenting with chest pain. The history is provided by the patient.  Chest Pain The chest pain began 6 - 12 hours ago. Episode Length: started as a sharp severe pain lasting about 60sec then pressure type pain all day. Chest pain occurs frequently. The chest pain is unchanged. At its most intense, the pain is at 10/10. The pain is currently at 5/10. The severity of the pain is moderate. The quality of the pain is described as pressure-like, sharp, squeezing and tightness. The pain radiates to the right arm (intial pain went into the right arm and down the right abdomen to the groin.  now no radiation). Exacerbated by: nothing. Primary symptoms include fatigue, shortness of breath, cough, abdominal pain and nausea. Pertinent negatives for primary symptoms include no fever, no palpitations, no vomiting and no dizziness.  Pertinent negatives for associated symptoms include no diaphoresis and no lower extremity edema. She tried aspirin for the symptoms.  Her past medical history is significant for hypertension.  Pertinent negatives for past medical history include no aneurysm, no CAD, no diabetes, no MI and no PE.  Pertinent negatives for family medical history include: no CAD in family.  Procedure history is positive for exercise treadmill test. Procedure history comments: normal stress test 09'.     Past Medical History  Diagnosis Date  . IBS (irritable bowel syndrome)   . 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 Hiatal hernia   . Esophageal reflux   . Essential hypertension, benign   . Depressive disorder, not elsewhere classified   . Obesity     . Barrett esophagus   . Grave's disease   . Hypothyroidism   . Osteopenia   . Symptomatic menopausal or female climacteric states   . Sleep apnea   . Diverticulosis of colon (without mention of hemorrhage)   . Gastritis     Past Surgical History  Procedure Date  . Abdominal hysterectomy   . Tonsillectomy   . Cholecystectomy   . Incontinence surgery     Family History  Problem Relation Age of Onset  . Diabetes    . Colon cancer Neg Hx   . Clotting disorder    . Breast cancer      aunt  . Lung cancer Father     History  Substance Use Topics  . Smoking status: Former Games developer  . Smokeless tobacco: Never Used  . Alcohol Use: No    OB History    Grav Para Term Preterm Abortions TAB SAB Ect Mult Living                  Review of Systems  Constitutional: Positive for fatigue. Negative for fever and diaphoresis.  Respiratory: Positive for cough and shortness of breath.   Cardiovascular: Positive for chest pain. Negative for palpitations.  Gastrointestinal: Positive for nausea and abdominal pain. Negative for vomiting.  Neurological: Negative for dizziness.  All other systems reviewed and are negative.    Allergies  Adhesive  Home Medications   Current Outpatient Rx  Name  Route  Sig  Dispense  Refill  . ASPIRIN 81 MG PO TABS   Oral  Take 81 mg by mouth daily.           . ATENOLOL 25 MG PO TABS   Oral   Take 25 mg by mouth daily.          Marland Kitchen CALCIUM MAGNESIUM PO   Oral   Take by mouth.           Marland Kitchen CLINDINIUM-CHLORDIAZEPOXIDE 2.5-5 MG PO CAPS   Oral   Take 1 capsule by mouth 4 (four) times daily -  before meals and at bedtime.   120 capsule   2   . ESOMEPRAZOLE MAGNESIUM 40 MG PO CPDR   Oral   Take 40 mg by mouth as needed.           . OMEGA-3 FATTY ACIDS 1000 MG PO CAPS   Oral   Take 2 g by mouth daily.           Marland Kitchen HYDROCHLOROTHIAZIDE 12.5 MG PO CAPS   Oral   Take 25 mg by mouth daily.          Marland Kitchen HYOSCYAMINE SULFATE 0.125 MG  SL SUBL   Sublingual   Place 1 tablet (0.125 mg total) under the tongue every 4 (four) hours as needed for cramping.   90 tablet   6   . HYOSCYAMINE SULFATE 0.125 MG SL SUBL   Sublingual   Place 0.125 mg under the tongue as needed.           Marland Kitchen ONE-DAILY MULTI VITAMINS PO TABS   Oral   Take 1 tablet by mouth daily.             SpO2 100%  Physical Exam  Nursing note and vitals reviewed. Constitutional: She is oriented to person, place, and time. She appears well-developed and well-nourished. No distress.  HENT:  Head: Normocephalic and atraumatic.  Mouth/Throat: Oropharynx is clear and moist.  Eyes: Conjunctivae normal and EOM are normal. Pupils are equal, round, and reactive to light.  Neck: Normal range of motion. Neck supple.  Cardiovascular: Normal rate, regular rhythm and intact distal pulses.   No murmur heard.      Normal and equal pulses in all extremities.  Pulmonary/Chest: Effort normal and breath sounds normal. No respiratory distress. She has no wheezes. She has no rales. She exhibits no tenderness.  Abdominal: Soft. She exhibits no distension. There is no tenderness. There is no rebound and no guarding.  Musculoskeletal: Normal range of motion. She exhibits no edema and no tenderness.  Neurological: She is alert and oriented to person, place, and time.  Skin: Skin is warm and dry. No rash noted. No erythema.  Psychiatric: She has a normal mood and affect. Her behavior is normal.    ED Course  Procedures (including critical care time)  Labs Reviewed  CBC WITH DIFFERENTIAL - Abnormal; Notable for the following:    WBC 12.2 (*)     All other components within normal limits  COMPREHENSIVE METABOLIC PANEL - Abnormal; Notable for the following:    GFR calc non Af Amer 65 (*)     GFR calc Af Amer 75 (*)     All other components within normal limits  LIPASE, BLOOD  D-DIMER, QUANTITATIVE  TROPONIN I  URINALYSIS, ROUTINE W REFLEX MICROSCOPIC   Dg Chest 2  View  02/05/2012  *RADIOLOGY REPORT*  Clinical Data: Right-sided chest pain radiating down right arm  CHEST - 2 VIEW  Comparison: None.  Findings: Normal cardiac silhouette and mediastinal contours. Bilateral punctate  calcified granulomas and possibly partially calcified bilateral hilar lymph nodes are favored to be sequela of prior granulomas infection.  Minimal left basilar linear heterogeneous opacities favored to represent atelectasis.  No focal airspace opacities.  No pleural effusion or pneumothorax.  No acute osseous abnormalities.  Post cholecystectomy.  IMPRESSION: Sequela of prior granulomatous infection without acute cardiopulmonary disease.   Original Report Authenticated By: Tacey Ruiz, MD      Date: 02/05/2012  Rate: 60  Rhythm: normal sinus rhythm  QRS Axis: normal  Intervals: normal  ST/T Wave abnormalities: normal  Conduction Disutrbances:none  Narrative Interpretation:   Old EKG Reviewed: unchanged   No diagnosis found.    MDM   Patient chest pain that started abruptly at 2 AM this morning which started in her chest and radiated down her right arm and then down into her abdomen into her right leg. That pain lasted approximately a minute but then she's had persistent chest pressure and discomfort all day. She complains of some mild nausea and minimal shortness of breath but denies any further abdominal pain or vomiting currently. Patient was seen by her PCP who sent her here for further evaluation. Patient does have a history of hypertension but no heart history otherwise. She states she had a normal stress test in 2009 but does not remember why she got the stress test. Patient was given 325 aspirin prior to arrival it did not change her symptoms. She does have a history of IBS and esophageal reflux but states her symptoms have never caused pain like this. Given her symptoms and the location of her pain when it initially started concern for possible dissection however she  does have normal pulses in all extremities normal sensation and good capillary refill. Will get a d-dimer, chest x-ray for evaluation. May need to do a CTA of the chest abdomen and pelvis to rule out dissection. Also this could be atypical ACS. EKG and troponin pending and will try nitroglycerin for the chest pressure. Also this could be do to GI origin based on prior history.  EKG is within normal limits. CBC, CMP, lipase, UA, d-dimer, troponin, chest x-ray pending  8:13 PM Initial labs within normal limits. Will do a CTA of the chest and abdomen pelvis to rule out dissection.  Patient was never given nitroglycerin but we'll try a GI cocktail      Gwyneth Sprout, MD 02/05/12 2013

## 2012-02-05 NOTE — ED Notes (Signed)
Pt in via [facility] from PCP office Rudi Heap, MD for eval of R sided CP onset today @ 2:00 with R arm tingling, pt denies N/V/D & SOB, pt hx of htn, pt received 324 ASA prior to arrival, pt CP free upon arrival, pt A&O x4, follows commands, speaks in complete sentences, pt reports pressure in R chest

## 2012-02-05 NOTE — ED Notes (Signed)
Patient transported to CT 

## 2012-02-06 NOTE — ED Provider Notes (Signed)
Medical screening examination/treatment/procedure(s) were conducted as a shared visit with non-physician practitioner(s) and myself.  I personally evaluated the patient during the encounter.  Patient evaluated and treated with Sharen Hones, NP. Aortic thrombus d/w cardiology, outpatient treatment recommended.  Gilda Crease, MD 02/06/12 [phone]

## 2012-02-21 ENCOUNTER — Other Ambulatory Visit: Payer: Self-pay

## 2012-02-21 DIAGNOSIS — G4730 Sleep apnea, unspecified: Secondary | ICD-10-CM

## 2012-02-29 ENCOUNTER — Ambulatory Visit: Payer: 59 | Attending: Family Medicine | Admitting: Sleep Medicine

## 2012-02-29 VITALS — Ht 67.0 in | Wt 190.0 lb

## 2012-02-29 DIAGNOSIS — G4733 Obstructive sleep apnea (adult) (pediatric): Secondary | ICD-10-CM | POA: Insufficient documentation

## 2012-02-29 DIAGNOSIS — G4730 Sleep apnea, unspecified: Secondary | ICD-10-CM

## 2012-02-29 DIAGNOSIS — Z6830 Body mass index (BMI) 30.0-30.9, adult: Secondary | ICD-10-CM | POA: Insufficient documentation

## 2012-03-03 NOTE — Procedures (Signed)
[facility] Annissa Andreoni A. Gerilyn Pilgrim, MD     [email or web address]        NAME:  MERA, GUNKEL                  ACCOUNT NO.:  [id number]  MEDICAL RECORD NO.:  [id number]          PATIENT TYPE:  OUT  LOCATION:  SLEEP LAB                     FACILITY:  [facility]  PHYSICIAN:  Ryana Montecalvo A. Gerilyn Pilgrim, M.D. DATE OF BIRTH:  09-20-1950  DATE OF STUDY:  02/29/2012                           NOCTURNAL POLYSOMNOGRAM  REFERRING PHYSICIAN:  Thelma Barge P. Modesto Charon, M.D.   INDICATIONS:  This is a 62 year old, who presents with loud snoring, witnessed apnea, and obesity.  The study is being done to evaluate for obstructive sleep apnea syndrome.   EPWORTH SLEEPINESS SCORE:  3.  BMI 30.  MEDICATIONS:  Aspirin, atenolol, hydrochlorothiazide, omega 3 oil, and multivitamin.  SLEEP ARCHITECTURE:  The total recording time is 389 minutes, sleep efficiency 83%.  Sleep latency 6.5 minutes.  REM latency 119 minutes. Stage N1 is 5%, N2 is 6%, N3 is 1%, and REM sleep 19%.  RESPIRATORY DATA:  Baseline oxygen saturation is 98, lowest saturation 75 during the REM sleep.  Diagnostic AHI is 20 with more events occurring during REM sleep.  The REM AHI is 63.  CARDIAC DATA:  Average heart rate is 61 with no significant dysrhythmias observed.  MOVEMENT-PARASOMNIA:  PLM index 0.  IMPRESSIONS-RECOMMENDATIONS:  Moderate obstructive sleep apnea syndrome worse during the REM sleep.  Recommendation is for a formal CPAP titration study.  Thanks for this referral.     Reade Trefz A. Gerilyn Pilgrim, M.D.    KAD/MEDQ  D:  03/03/2012 17:47:36  T:  03/03/2012 18:14:49  Job:  [id number]

## 2012-04-17 ENCOUNTER — Telehealth: Payer: Self-pay | Admitting: Family Medicine

## 2012-04-17 NOTE — Telephone Encounter (Signed)
allbuterol refill please. [facility] in [place]

## 2012-04-18 ENCOUNTER — Other Ambulatory Visit: Payer: Self-pay | Admitting: *Deleted

## 2012-04-18 NOTE — Telephone Encounter (Signed)
PT NOTIFIED RX SENT TO DOCTOR FOR APPROVAL

## 2012-04-19 ENCOUNTER — Other Ambulatory Visit: Payer: Self-pay | Admitting: Family Medicine

## 2012-04-19 MED ORDER — ALBUTEROL SULFATE HFA 108 (90 BASE) MCG/ACT IN AERS: 2.0000 | INHALATION_SPRAY | Freq: Four times a day (QID) | RESPIRATORY_TRACT | Status: DC | PRN

## 2012-04-19 NOTE — Telephone Encounter (Signed)
Prescription refills. Could not get patient to inform her.

## 2012-04-22 NOTE — Telephone Encounter (Signed)
Pt aware rx's been called per dr Modesto Charon

## 2012-05-01 ENCOUNTER — Encounter: Payer: Self-pay | Admitting: Family Medicine

## 2012-06-21 ENCOUNTER — Ambulatory Visit: Payer: Self-pay | Admitting: Family Medicine

## 2013-01-10 ENCOUNTER — Ambulatory Visit (INDEPENDENT_AMBULATORY_CARE_PROVIDER_SITE_OTHER): Payer: 59

## 2013-01-10 VITALS — BP 120/80 | HR 72 | Resp 18

## 2013-01-10 DIAGNOSIS — M2040 Other hammer toe(s) (acquired), unspecified foot: Secondary | ICD-10-CM

## 2013-01-10 DIAGNOSIS — M2042 Other hammer toe(s) (acquired), left foot: Secondary | ICD-10-CM

## 2013-01-10 DIAGNOSIS — M79609 Pain in unspecified limb: Secondary | ICD-10-CM

## 2013-01-10 DIAGNOSIS — M7752 Other enthesopathy of left foot: Secondary | ICD-10-CM

## 2013-01-10 DIAGNOSIS — M7750 Other enthesopathy of unspecified foot: Secondary | ICD-10-CM

## 2013-01-10 DIAGNOSIS — M2010 Hallux valgus (acquired), unspecified foot: Secondary | ICD-10-CM

## 2013-01-10 DIAGNOSIS — M2012 Hallux valgus (acquired), left foot: Secondary | ICD-10-CM

## 2013-01-10 NOTE — Patient Instructions (Signed)

## 2013-01-10 NOTE — Progress Notes (Signed)
   Subjective:    Patient ID: Laura James, female    DOB: Jul 03, 1950, 62 y.o.   MRN: [id number]  HPI My left foot on my 2nd toe is trying to go over the big toe and  has been bothering me for about a year now and sore and tender and feels like the bone is touching the floor and no swelling and burns and throbbing and I have had surgery on the right foot Patient is a previous surgery on her right foot including bunionectomy as well as hammertoe repair and possible second osteotomy. Status and cellulitis the second toe on the right foot and patient similar deformities on her left foot hallux abductovalgus deformity and hammertoe deformity second is concerned that the second toe is also into the elevating.   Review of Systems  Constitutional: Positive for activity change.  HENT: Positive for sinus pressure.   Eyes: Negative.   Respiratory: Positive for cough.   Cardiovascular: Negative.   Gastrointestinal: Negative.   Endocrine: Negative.   Genitourinary: Negative.   Musculoskeletal:       Back and muscle pain and difficulty walking  Skin: Negative.   Allergic/Immunologic: Positive for environmental allergies.  Neurological: Negative.   Hematological: Negative.   Psychiatric/Behavioral: Negative.        Objective:   Physical Exam Neurovascular status is intact pedal pulses palpable DP and PT +2/4 bilateral Refill time 3 seconds all digits skin temperature warm turgor normal no edema rubor pallor or varicosities noted. Epicritic and proprioceptive sensations intact and symmetric bilateral. 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 Patient also has dorsal and medial displacement second digit at the MTP joint with capsulitis as well as hammertoe deformity second left. This time patient can he is dispensed literature about possible future bunion surgery as well as hammertoe repair possible second  metatarsal osteotomy. Patient given literature discussed options risk L. toes the surgery and postop followup she likely need to be out of work for approximately one month or 4 weeks for a one-month duration was reduced to restrict activities. We'll reappoint her to me for possible future surgical consult and additional discussions if needed  Alvan Dame DPM       Assessment & Plan:

## 2013-05-20 ENCOUNTER — Encounter: Payer: Self-pay | Admitting: Family Medicine

## 2013-05-20 ENCOUNTER — Ambulatory Visit (INDEPENDENT_AMBULATORY_CARE_PROVIDER_SITE_OTHER): Payer: 59 | Admitting: Family Medicine

## 2013-05-20 ENCOUNTER — Encounter (INDEPENDENT_AMBULATORY_CARE_PROVIDER_SITE_OTHER): Payer: Self-pay

## 2013-05-20 ENCOUNTER — Ambulatory Visit (INDEPENDENT_AMBULATORY_CARE_PROVIDER_SITE_OTHER): Payer: 59

## 2013-05-20 VITALS — BP 142/84 | HR 68 | Temp 97.4°F | Ht 66.0 in | Wt 194.6 lb

## 2013-05-20 DIAGNOSIS — K449 Diaphragmatic hernia without obstruction or gangrene: Secondary | ICD-10-CM | POA: Insufficient documentation

## 2013-05-20 DIAGNOSIS — I10 Essential (primary) hypertension: Secondary | ICD-10-CM

## 2013-05-20 DIAGNOSIS — IMO0002 Reserved for concepts with insufficient information to code with codable children: Secondary | ICD-10-CM

## 2013-05-20 DIAGNOSIS — M549 Dorsalgia, unspecified: Secondary | ICD-10-CM

## 2013-05-20 DIAGNOSIS — M5416 Radiculopathy, lumbar region: Secondary | ICD-10-CM

## 2013-05-20 NOTE — Progress Notes (Signed)
Patient ID: Laura James, female   DOB: [birth year], 63 y.o.   MRN: [id number] SUBJECTIVE: CC: Chief Complaint  Patient presents with  . Acute Visit    low back pain with "drawing pain in both legs" wants to be referred to orthropedic     HPI:  low back problems for years and worse the last 6 months. Drawing type of discomfort into the legs. Painful 7/10. Weakness: no Numbness: a little. The inside thighs and upper leg. Staggering: no. Falls: no No fever , no chills.  Past Medical History  Diagnosis Date  . IBS (irritable bowel syndrome)   . 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 Esophageal reflux   . Depressive disorder, not elsewhere classified   . Obesity   . Barrett esophagus   . Grave's disease   . Hypothyroidism   . Osteopenia   . Symptomatic menopausal or female climacteric states   . Sleep apnea   . Diverticulosis of colon (without mention of hemorrhage)   . Gastritis   . Essential hypertension, benign   . Hiatal hernia    Past Surgical History  Procedure Laterality Date  . Abdominal hysterectomy    . Tonsillectomy    . Cholecystectomy    . Incontinence surgery     History   Social History  . Marital Status: Married    Spouse Name: N/A    Number of Children: 3  . Years of Education: N/A   Occupational History  . hospice     [employer]   Social History Main Topics  . Smoking status: Former Research scientist (life sciences)  . Smokeless tobacco: Never Used  . Alcohol Use: No  . Drug Use: No  . Sexual Activity: Not on file   Other Topics Concern  . Not on file   Social History Narrative  . No narrative on file   Family History  Problem Relation Age of Onset  . Diabetes    . Colon cancer Neg Hx   . Clotting disorder    . Breast cancer      aunt  . Lung cancer Father    Current Outpatient Prescriptions on File Prior to Visit  Medication Sig Dispense Refill  . albuterol (PROVENTIL HFA;VENTOLIN HFA) 108 (90 BASE)  MCG/ACT inhaler Inhale 2 puffs into the lungs every 6 (six) hours as needed. For wheeze/shortness of breath  1 Inhaler  1  . aspirin 81 MG tablet Take 81 mg by mouth daily.        Marland Kitchen atenolol (TENORMIN) 25 MG tablet Take 25 mg by mouth daily.       . hydrochlorothiazide (,MICROZIDE/HYDRODIURIL,) 12.5 MG capsule Take 12.5 mg by mouth daily.       . Multiple Vitamin (MULTIVITAMIN) tablet Take 1 tablet by mouth daily.        Marland Kitchen amoxicillin (AMOXIL) 875 MG tablet Take 875 mg by mouth 2 (two) times daily.      . fish oil-omega-3 fatty acids 1000 MG capsule Take 2 g by mouth daily.        Marland Kitchen HYDROcodone-homatropine (HYCODAN) 5-1.5 MG/5ML syrup Take 5 mLs by mouth every 6 (six) hours as needed. For cough      . hyoscyamine (LEVSIN/SL) 0.125 MG SL tablet Place 1 tablet (0.125 mg total) under the tongue every 4 (four) hours as needed for cramping.  90 tablet  6   No current facility-administered medications on file prior to visit.   Allergies  Allergen  Reactions  . Adhesive [Tape]     There is no immunization history on file for this patient. Prior to Admission medications   Medication Sig Start Date End Date Taking? Authorizing Provider  albuterol (PROVENTIL HFA;VENTOLIN HFA) 108 (90 BASE) MCG/ACT inhaler Inhale 2 puffs into the lungs every 6 (six) hours as needed. For wheeze/shortness of breath 04/19/12   Vernie Shanks, MD  amoxicillin (AMOXIL) 875 MG tablet Take 875 mg by mouth 2 (two) times daily.    Historical Provider, MD  aspirin 81 MG tablet Take 81 mg by mouth daily.      Historical Provider, MD  atenolol (TENORMIN) 25 MG tablet Take 25 mg by mouth daily.     Historical Provider, MD  fish oil-omega-3 fatty acids 1000 MG capsule Take 2 g by mouth daily.      Historical Provider, MD  hydrochlorothiazide (,MICROZIDE/HYDRODIURIL,) 12.5 MG capsule Take 12.5 mg by mouth daily.     Historical Provider, MD  HYDROcodone-homatropine (HYCODAN) 5-1.5 MG/5ML syrup Take 5 mLs by mouth every 6 (six) hours as  needed. For cough    Historical Provider, MD  hyoscyamine (LEVSIN/SL) 0.125 MG SL tablet Place 1 tablet (0.125 mg total) under the tongue every 4 (four) hours as needed for cramping. 08/25/10 09/04/10  Sable Feil, MD  Multiple Vitamin (MULTIVITAMIN) tablet Take 1 tablet by mouth daily.      Historical Provider, MD     ROS: As above in the HPI. All other systems are stable or negative.  OBJECTIVE: APPEARANCE:  Patient in no acute distress.The patient appeared well nourished and normally developed. Acyanotic. Waist: VITAL SIGNS:BP 142/84  Pulse 68  Temp(Src) 97.4 F (36.3 C) (Oral)  Ht _0  (1.676 m)  Wt 194 lb 9.6 oz (88.27 kg)  BMI 31.42 kg/m2 AAF  SKIN: warm and  Dry without overt rashes, tattoos and scars  HEAD and Neck: without JVD, Head and scalp: normal Eyes:No scleral icterus. Fundi normal, eye movements normal. Ears: Auricle normal, canal normal, Tympanic membranes normal, insufflation normal. Nose: normal Throat: normal Neck & thyroid: normal  CHEST & LUNGS: Chest wall: normal Lungs: Clear  CVS: Reveals the PMI to be normally located. Regular rhythm, First and Second Heart sounds are normal,  absence of murmurs, rubs or gallops. Peripheral vasculature: Radial pulses: normal Dorsal pedis pulses: normal Posterior pulses: normal  ABDOMEN:  Appearance: normal Benign, no organomegaly, no masses, no Abdominal Aortic enlargement. No Guarding , no rebound. No Bruits. Bowel sounds: normal  RECTAL: N/A GU: N/A  EXTREMETIES: nonedematous.  MUSCULOSKELETAL:  Spine: Flexion to 90 degrees. Mild pain. SLR on the right ++ Joints: intact  NEUROLOGIC: oriented to time,place and person; nonfocal. Strength is normal Sensory is normal. Radicular pain down the right leg with SLR. Reflexes are normal Cranial Nerves are normal.  ASSESSMENT: Essential hypertension, benign - Plan: CMP14+EGFR  Back pain - Plan: DG Lumbar Spine Complete  Lumbar radiculopathy -  Plan: CMP14+EGFR, TSH, Vitamin B12  PLAN:  Orders Placed This Encounter  Procedures  . DG Lumbar Spine Complete    Standing Status: Future     Number of Occurrences: 1     Standing Expiration Date: 07/21/2014    Order Specific Question:  Reason for Exam (SYMPTOM  OR DIAGNOSIS REQUIRED)    Answer:  lumbar radiculopathy    Order Specific Question:  Preferred imaging location?    Answer:  Internal  . CMP14+EGFR  . TSH  . Vitamin B12  [facility] reading (PRIMARY) by  Dr. Jacelyn Grip: L5-S1 foraminal narrowing. Mild degenerative changes.                                 Meds ordered this encounter  Medications  . Coenzyme Q10 (CO Q 10 PO)    Sig: Take 1 capsule by mouth daily.  . cholecalciferol (VITAMIN D-400) 400 UNITS TABS tablet    Sig: Take 400 Units by mouth.   There are no discontinued medications. Return if symptoms worsen or fail to improve. Follow up pending MRI. Consider PT. Patient does not desire surgery.  Megen Madewell P. Jacelyn Grip, M.D.

## 2013-05-21 LAB — CMP14+EGFR
ALT: 17 IU/L (ref 0–32)
AST: 24 IU/L (ref 0–40)
Albumin/Globulin Ratio: 1.6 (ref 1.1–2.5)
Albumin: 4.4 g/dL (ref 3.6–4.8)
Alkaline Phosphatase: 78 IU/L (ref 39–117)
BUN/Creatinine Ratio: 15 (ref 11–26)
BUN: 14 mg/dL (ref 8–27)
CO2: 23 mmol/L (ref 18–29)
Calcium: 10.3 mg/dL (ref 8.7–10.3)
Chloride: 100 mmol/L (ref 97–108)
Creatinine, Ser: 0.94 mg/dL (ref 0.57–1.00)
GFR calc Af Amer: 74 mL/min/{1.73_m2} (ref >59)
GFR calc non Af Amer: 64 mL/min/{1.73_m2} (ref >59)
Globulin, Total: 2.7 g/dL (ref 1.5–4.5)
Glucose: 84 mg/dL (ref 65–99)
Potassium: 3.9 mmol/L (ref 3.5–5.2)
Sodium: 143 mmol/L (ref 134–144)
Total Bilirubin: 0.3 mg/dL (ref 0.0–1.2)
Total Protein: 7.1 g/dL (ref 6.0–8.5)

## 2013-05-21 LAB — TSH: TSH: 1.6 u[IU]/mL (ref 0.450–4.500)

## 2013-05-21 LAB — VITAMIN B12: Vitamin B-12: 412 pg/mL (ref 211–946)

## 2013-05-21 NOTE — Progress Notes (Signed)
Quick Note:  Call patient. Labs normal. No change in plan. ______ 

## 2013-05-23 ENCOUNTER — Other Ambulatory Visit: Payer: Self-pay | Admitting: Family Medicine

## 2013-05-23 DIAGNOSIS — Z13220 Encounter for screening for lipoid disorders: Secondary | ICD-10-CM

## 2013-05-28 ENCOUNTER — Ambulatory Visit (HOSPITAL_COMMUNITY)
Admission: RE | Admit: 2013-05-28 | Discharge: 2013-05-28 | Disposition: A | Discharge status: Home / Self Care | Payer: 59 | Source: Ambulatory Visit | Attending: Family Medicine | Admitting: Family Medicine

## 2013-05-28 DIAGNOSIS — M5416 Radiculopathy, lumbar region: Secondary | ICD-10-CM

## 2013-05-28 DIAGNOSIS — M5126 Other intervertebral disc displacement, lumbar region: Secondary | ICD-10-CM | POA: Insufficient documentation

## 2013-05-28 DIAGNOSIS — M5146 Schmorl's nodes, lumbar region: Secondary | ICD-10-CM | POA: Insufficient documentation

## 2013-05-29 ENCOUNTER — Other Ambulatory Visit: Payer: Self-pay | Admitting: Family Medicine

## 2013-05-29 DIAGNOSIS — M549 Dorsalgia, unspecified: Secondary | ICD-10-CM

## 2013-05-29 NOTE — Progress Notes (Signed)
Quick Note:  Call Patient MRI: shows bulging disc. No nerve compression.   The rest are at goal  Recommendations: Referral to physical therapy. i will go ahead and order PT in EPIC   ______

## 2013-06-17 ENCOUNTER — Other Ambulatory Visit: Payer: Self-pay

## 2013-06-17 MED ORDER — ATENOLOL 25 MG PO TABS: 25.0000 mg | ORAL_TABLET | Freq: Every day | ORAL | Status: DC

## 2013-06-23 ENCOUNTER — Other Ambulatory Visit: Payer: Self-pay

## 2013-06-23 MED ORDER — HYDROCHLOROTHIAZIDE 12.5 MG PO CAPS: 12.5000 mg | ORAL_CAPSULE | Freq: Every day | ORAL | Status: DC

## 2013-08-12 ENCOUNTER — Encounter: Payer: Self-pay | Admitting: Nurse Practitioner

## 2013-08-12 ENCOUNTER — Ambulatory Visit (INDEPENDENT_AMBULATORY_CARE_PROVIDER_SITE_OTHER): Payer: 59 | Admitting: Nurse Practitioner

## 2013-08-12 VITALS — BP 110/84 | HR 67 | Temp 98.4°F | Ht 66.0 in | Wt 193.6 lb

## 2013-08-12 DIAGNOSIS — J019 Acute sinusitis, unspecified: Secondary | ICD-10-CM

## 2013-08-12 DIAGNOSIS — L988 Other specified disorders of the skin and subcutaneous tissue: Secondary | ICD-10-CM

## 2013-08-12 DIAGNOSIS — N649 Disorder of breast, unspecified: Secondary | ICD-10-CM

## 2013-08-12 MED ORDER — AZITHROMYCIN 250 MG PO TABS: ORAL_TABLET | ORAL | Status: DC

## 2013-08-12 NOTE — Patient Instructions (Signed)

## 2013-08-12 NOTE — Progress Notes (Signed)
   Subjective:    Patient ID: Laura James, female    DOB: [birth year], 63 y.o.   MRN: [id number]  HPI Patient in c/o congetsion and headache that started last Saturday- Has taken alkerseltzer plus one time which did not help. She also has a rash on her right breast that she wants looked at- It has been there for 2-3 months- unchanged since she noticed it.    Review of Systems  Constitutional: Positive for fatigue. Negative for fever and chills.  HENT: Positive for congestion, ear pain, sinus pressure and sore throat. Negative for trouble swallowing and voice change.   Respiratory: Positive for cough (productive at times).   Cardiovascular: Negative.   Gastrointestinal: Negative.   Skin: Positive for rash (right breast).  Neurological: Positive for headaches.       Objective:   Physical Exam  Constitutional: She is oriented to person, place, and time. She appears well-developed and well-nourished. No distress.  HENT:  Right Ear: Hearing, tympanic membrane, external ear and ear canal normal.  Left Ear: Hearing, tympanic membrane, external ear and ear canal normal.  Nose: Mucosal edema and rhinorrhea present. Right sinus exhibits maxillary sinus tenderness. Right sinus exhibits no frontal sinus tenderness. Left sinus exhibits maxillary sinus tenderness. Left sinus exhibits no frontal sinus tenderness.  Mouth/Throat: Uvula is midline, oropharynx is clear and moist and mucous membranes are normal.  Eyes: Pupils are equal, round, and reactive to light.  Neck: Normal range of motion. Neck supple.  Cardiovascular: Normal rate, regular rhythm and normal heart sounds.   Pulmonary/Chest: Effort normal and breath sounds normal.  Lymphadenopathy:    She has no cervical adenopathy.  Neurological: She is alert and oriented to person, place, and time.  Skin:  6cm annular brownish macular lesion on right breast  Psychiatric: She has a normal mood and affect. Her behavior is normal. Judgment and  thought content normal.    BP 110/84  Pulse 67  Temp(Src) 98.4 F (36.9 C) (Oral)  Ht 5\' 6"  (1.676 m)  Wt 193 lb 9.6 oz (87.816 kg)  BMI 31.26 kg/m2       Assessment & Plan:  1. Acute rhinosinusitis 1. Take meds as prescribed 2. Use a cool mist humidifier especially during the winter months and when heat has been humid. 3. Use saline nose sprays frequently 4. Saline irrigations of the nose can be very helpful if done frequently.  * 4X daily for 1 week*  * Use of a nettie pot can be helpful with this. Follow directions with this* 5. Drink plenty of fluids 6. Keep thermostat turn down low 7.For any cough or congestion  Use plain Mucinex- regular strength or max strength is fine   * Children- consult with Pharmacist for dosing 8. For fever or aces or pains- take tylenol or ibuprofen appropriate for age and weight.  * for fevers greater than 101 orally you may alternate ibuprofen and tylenol every  3 hours.   - azithromycin (ZITHROMAX Z-PAK) 250 MG tablet; As directed  Dispense: 6 each; Refill: 0  2. Skin lesion of breast Try cortizone cream OTC If no improvement will do dermatology referral  [place], FNP

## 2013-08-13 ENCOUNTER — Telehealth: Payer: Self-pay | Admitting: Nurse Practitioner

## 2013-10-17 ENCOUNTER — Other Ambulatory Visit: Payer: Self-pay | Admitting: *Deleted

## 2013-10-17 MED ORDER — HYDROCHLOROTHIAZIDE 12.5 MG PO CAPS: 12.5000 mg | ORAL_CAPSULE | Freq: Every day | ORAL | Status: DC

## 2013-11-15 ENCOUNTER — Other Ambulatory Visit: Payer: Self-pay | Admitting: Family Medicine

## 2013-11-20 ENCOUNTER — Other Ambulatory Visit: Payer: Self-pay | Admitting: Nurse Practitioner

## 2013-11-24 ENCOUNTER — Telehealth: Payer: Self-pay | Admitting: Nurse Practitioner

## 2013-11-24 NOTE — Telephone Encounter (Signed)
Patient was given an appointment for in the am at 9:45

## 2013-11-25 ENCOUNTER — Encounter: Payer: Self-pay | Admitting: Nurse Practitioner

## 2013-11-25 ENCOUNTER — Ambulatory Visit (INDEPENDENT_AMBULATORY_CARE_PROVIDER_SITE_OTHER): Payer: Medicare Other | Admitting: Nurse Practitioner

## 2013-11-25 ENCOUNTER — Other Ambulatory Visit: Payer: Self-pay

## 2013-11-25 ENCOUNTER — Ambulatory Visit (INDEPENDENT_AMBULATORY_CARE_PROVIDER_SITE_OTHER): Payer: Medicare Other

## 2013-11-25 VITALS — BP 115/70 | HR 66 | Temp 97.0°F | Ht 66.0 in | Wt 194.4 lb

## 2013-11-25 DIAGNOSIS — K219 Gastro-esophageal reflux disease without esophagitis: Secondary | ICD-10-CM | POA: Diagnosis not present

## 2013-11-25 DIAGNOSIS — R0602 Shortness of breath: Secondary | ICD-10-CM | POA: Diagnosis not present

## 2013-11-25 DIAGNOSIS — R001 Bradycardia, unspecified: Secondary | ICD-10-CM | POA: Diagnosis not present

## 2013-11-25 MED ORDER — OMEPRAZOLE 40 MG PO CPDR: 40.0000 mg | DELAYED_RELEASE_CAPSULE | Freq: Every day | ORAL | Status: DC

## 2013-11-25 MED ORDER — HYDROCHLOROTHIAZIDE 25 MG PO TABS: 25.0000 mg | ORAL_TABLET | Freq: Every day | ORAL | Status: DC

## 2013-11-25 NOTE — Progress Notes (Signed)
   Subjective:    Patient ID: Laura James, female    DOB: 04-13-1950, 63 y.o.   MRN: [id number]  HPI Patient in today c/o feels like something is in her right lung- this least little bit of activity makes her SOB. Started about 8 months ago and is worsening. Was seen in office with same complaint when this first started and they were more concerned about her heart then her lungs. Ruled out heart problems at that time.    Review of Systems  Constitutional: Negative.   HENT: Negative.   Eyes: Negative.   Respiratory: Positive for cough (nagging daily) and shortness of breath.   Cardiovascular: Negative.   Gastrointestinal: Negative.   Genitourinary: Negative.   Neurological: Negative.   Psychiatric/Behavioral: Negative.   All other systems reviewed and are negative.      Objective:   Physical Exam  Constitutional: She is oriented to person, place, and time. She appears well-developed and well-nourished. No distress.  HENT:  Right Ear: External ear normal.  Left Ear: External ear normal.  Nose: Nose normal.  Mouth/Throat: Oropharynx is clear and moist.  Cardiovascular: Normal rate, regular rhythm and normal heart sounds.   Pulmonary/Chest: Effort normal and breath sounds normal. No respiratory distress. She has no wheezes. She has no rales. She exhibits no tenderness.  Abdominal: Soft. Bowel sounds are normal.  Neurological: She is alert and oriented to person, place, and time.  Skin: Skin is warm and dry.  Psychiatric: She has a normal mood and affect. Her behavior is normal. Judgment and thought content normal.    BP 115/70 mmHg  Pulse 66  Temp(Src) 97 F (36.1 C) (Oral)  Ht 5\' 6"  (1.676 m)  Wt 194 lb 6 oz (88.168 kg)  BMI 31.39 kg/m2  Chest x ray- normal- no acute findings, no bronchitic changes-Preliminary reading by Ronnald Collum, FNP  [facility]  EKG - sinus bradycardia-Mary-Margaret Hassell Done, FNP     Assessment & Plan:  1. SOB (shortness of breath) on exertion No  strenous activitty - DG Chest 2 View; Future - EKG 12-Lead - Ambulatory referral to Cardiology  2. Gastroesophageal reflux disease without esophagitis Avoid spicy and fatty foods - omeprazole (PRILOSEC) 40 MG capsule; Take 1 capsule (40 mg total) by mouth daily.  Dispense: 30 capsule; Refill: 3  3. Bradycardia, sinus - Ambulatory referral to Cardiology   [place], FNP

## 2013-11-25 NOTE — Patient Instructions (Signed)
Bradycardia °Bradycardia is a term for a heart rate (pulse) that, in adults, is slower than 60 beats per minute. A normal rate is 60 to 100 beats per minute. A heart rate below 60 beats per minute may be normal for some adults with healthy hearts. If the rate is too slow, the heart may have trouble pumping the volume of blood the body needs. If the heart rate gets too low, blood flow to the brain may be decreased and may make you feel lightheaded, dizzy, or faint. °The heart has a natural pacemaker in the top of the heart called the SA node (sinoatrial or sinus node). This pacemaker sends out regular electrical signals to the muscle of the heart, telling the heart muscle when to beat (contract). The electrical signal travels from the upper parts of the heart (atria) through the AV node (atrioventricular node), to the lower chambers of the heart (ventricles). The ventricles squeeze, pumping the blood from your heart to your lungs and to the rest of your body. °CAUSES  °· Problem with the heart's electrical system. °· Problem with the heart's natural pacemaker. °· Heart disease, damage, or infection. °· Medications. °· Problems with minerals and salts (electrolytes). °SYMPTOMS  °· Fainting (syncope). °· Fatigue and weakness. °· Shortness of breath (dyspnea). °· Chest pain (angina). °· Drowsiness. °· Confusion. °DIAGNOSIS  °· An electrocardiogram (ECG) can help your caregiver determine the type of slow heart rate you have. °· If the cause is not seen on an ECG, you may need to wear a heart monitor that records your heart rhythm for several hours or days. °· Blood tests. °TREATMENT  °· Electrolyte supplements. °· Medications. °· Withholding medication which is causing a slow heart rate. °· Pacemaker placement. °SEEK IMMEDIATE MEDICAL CARE IF:  °· You feel lightheaded or faint. °· You develop an irregular heart rate. °· You feel chest pain or have trouble breathing. °MAKE SURE YOU:  °· Understand these  instructions. °· Will watch your condition. °· Will get help right away if you are not doing well or get worse. °Document Released: 10/01/2001 Document Revised: 04/03/2011 Document Reviewed: 04/16/2013 °ExitCare® Patient Information ©2015 [employer]. This information is not intended to replace advice given to you by your health care provider. Make sure you discuss any questions you have with your health care provider. ° °

## 2013-12-13 ENCOUNTER — Other Ambulatory Visit: Payer: Self-pay | Admitting: Family Medicine

## 2013-12-23 ENCOUNTER — Encounter: Payer: Self-pay | Admitting: Nurse Practitioner

## 2013-12-23 ENCOUNTER — Ambulatory Visit (INDEPENDENT_AMBULATORY_CARE_PROVIDER_SITE_OTHER): Payer: Medicare Other | Admitting: Nurse Practitioner

## 2013-12-23 VITALS — BP 136/65 | HR 66 | Temp 98.3°F | Ht 66.0 in | Wt 194.0 lb

## 2013-12-23 DIAGNOSIS — Z01419 Encounter for gynecological examination (general) (routine) without abnormal findings: Secondary | ICD-10-CM | POA: Diagnosis not present

## 2013-12-23 DIAGNOSIS — E559 Vitamin D deficiency, unspecified: Secondary | ICD-10-CM | POA: Diagnosis not present

## 2013-12-23 DIAGNOSIS — K219 Gastro-esophageal reflux disease without esophagitis: Secondary | ICD-10-CM

## 2013-12-23 DIAGNOSIS — G4733 Obstructive sleep apnea (adult) (pediatric): Secondary | ICD-10-CM | POA: Insufficient documentation

## 2013-12-23 DIAGNOSIS — Z23 Encounter for immunization: Secondary | ICD-10-CM

## 2013-12-23 DIAGNOSIS — I10 Essential (primary) hypertension: Secondary | ICD-10-CM | POA: Diagnosis not present

## 2013-12-23 DIAGNOSIS — Z0000 Encounter for general adult medical examination without abnormal findings: Secondary | ICD-10-CM

## 2013-12-23 DIAGNOSIS — M25551 Pain in right hip: Secondary | ICD-10-CM

## 2013-12-23 DIAGNOSIS — K589 Irritable bowel syndrome without diarrhea: Secondary | ICD-10-CM

## 2013-12-23 DIAGNOSIS — Z13820 Encounter for screening for osteoporosis: Secondary | ICD-10-CM

## 2013-12-23 LAB — POCT CBC
GRANULOCYTE PERCENT: 53.9 % (ref 37–80)
HEMATOCRIT: 41.1 % (ref 37.7–47.9)
HEMOGLOBIN: 13.2 g/dL (ref 12.2–16.2)
Lymph, poc: 3.1 (ref 0.6–3.4)
MCH, POC: 26.9 pg — AB (ref 27–31.2)
MCHC: 32.1 g/dL (ref 31.8–35.4)
MCV: 83.9 fL (ref 80–97)
MPV: 7.6 fL (ref 0–99.8)
POC GRANULOCYTE: 4.1 (ref 2–6.9)
POC LYMPH PERCENT: 40.9 %L (ref 10–50)
Platelet Count, POC: 308 10*3/uL (ref 142–424)
RBC: 4.9 M/uL (ref 4.04–5.48)
RDW, POC: 13.9 %
WBC: 7.6 10*3/uL (ref 4.6–10.2)

## 2013-12-23 MED ORDER — OMEPRAZOLE 40 MG PO CPDR: 40.0000 mg | DELAYED_RELEASE_CAPSULE | Freq: Every day | ORAL | Status: DC

## 2013-12-23 MED ORDER — ATENOLOL 25 MG PO TABS: ORAL_TABLET | ORAL | Status: DC

## 2013-12-23 MED ORDER — HYDROCHLOROTHIAZIDE 25 MG PO TABS: 25.0000 mg | ORAL_TABLET | Freq: Every day | ORAL | Status: DC

## 2013-12-23 NOTE — Progress Notes (Signed)
Subjective:    Patient ID: Laura James, female    DOB: 1950-08-19, 63 y.o.   MRN: [id number]  HPI Patient here today for annual physical exam, PAP and follow up of chronic medical problems: Patient is doing well- Her only compliant is right hip pain. Was blowing leaves outside and felt a pop in her hip- hurts o move and to sit for long periods of time- started about 3 weeks ago. -Hypertension- currently on tenormin and HCTZ- no side effects from meds- no c/o chest pain, or SOB. GERD- no esophagitis- currently taking prilosec which has helped a lot with symptoms.    Review of Systems  Constitutional: Negative.   HENT: Negative.   Respiratory: Negative.   Cardiovascular: Negative.   Gastrointestinal: Negative.   Genitourinary: Negative.   Musculoskeletal: Positive for gait problem (right hip pain).  Neurological: Negative.   Psychiatric/Behavioral: Negative.   All other systems reviewed and are negative.      Objective:   Physical Exam  Constitutional: She is oriented to person, place, and time. She appears well-developed and well-nourished.  HENT:  Head: Normocephalic.  Right Ear: Hearing, tympanic membrane, external ear and ear canal normal.  Left Ear: Hearing, tympanic membrane, external ear and ear canal normal.  Nose: Nose normal.  Mouth/Throat: Uvula is midline and oropharynx is clear and moist.  Eyes: Conjunctivae and EOM are normal. Pupils are equal, round, and reactive to light.  Neck: Normal range of motion and full passive range of motion without pain. Neck supple. No JVD present. Carotid bruit is not present. No thyroid mass and no thyromegaly present.  Cardiovascular: Normal rate, normal heart sounds and intact distal pulses.   No murmur heard. Pulmonary/Chest: Effort normal and breath sounds normal. Right breast exhibits no inverted nipple, no mass, no nipple discharge, no skin change and no tenderness. Left breast exhibits no inverted nipple, no mass, no nipple  discharge, no skin change and no tenderness.  Abdominal: Soft. Bowel sounds are normal. She exhibits no mass. There is no tenderness.  Genitourinary: Vagina normal and uterus normal. No breast swelling, tenderness, discharge or bleeding.  bimanual exam-No adnexal masses or tenderness.   Musculoskeletal: Normal range of motion.  Lymphadenopathy:    She has no cervical adenopathy.  Neurological: She is alert and oriented to person, place, and time.  Skin: Skin is warm and dry.  Psychiatric: She has a normal mood and affect. Her behavior is normal. Judgment and thought content normal.   BP 136/65 mmHg  Pulse 66  Temp(Src) 98.3 F (36.8 C) (Oral)  Ht 5' 6"  (1.676 m)  Wt 194 lb (87.998 kg)  BMI 31.33 kg/m2        Assessment & Plan:  1. Annual physical exam - POCT CBC - Thyroid Panel With TSH - Vit D  25 hydroxy (rtn osteoporosis monitoring)  2. Encounter for routine gynecological examination - Pap IG (Image Guided)  3. IBS (irritable bowel syndrome) Watch diet - omeprazole (PRILOSEC) 40 MG capsule; Take 1 capsule (40 mg total) by mouth daily.  Dispense: 30 capsule; Refill: 3  4. Essential hypertension, benign Do not add salt to diet - CMP14+EGFR - NMR, lipoprofile - atenolol (TENORMIN) 25 MG tablet; TAKE 1 TABLET (25 MG TOTAL) BY MOUTH DAILY.  Dispense: 90 tablet; Refill: 1 - hydrochlorothiazide (HYDRODIURIL) 25 MG tablet; Take 1 tablet (25 mg total) by mouth daily.  Dispense: 30 tablet; Refill: 5  5. Gastroesophageal reflux disease without esophagitis Avoid spicy foods  6. Obstructive  sleep apnea CPAP nightly  7. Screening for osteoporosis Weight bearing exercises - DG Bone Density; Future    Labs pending Health maintenance reviewed Diet and exercise encouraged Continue all meds Follow up  In 6 months   [place], FNP

## 2013-12-23 NOTE — Patient Instructions (Signed)

## 2013-12-24 LAB — THYROID PANEL WITH TSH
Free Thyroxine Index: 2.3 (ref 1.2–4.9)
T3 UPTAKE RATIO: 26 % (ref 24–39)
T4, Total: 9 ug/dL (ref 4.5–12.0)
TSH: 1.81 u[IU]/mL (ref 0.450–4.500)

## 2013-12-24 LAB — CMP14+EGFR
A/G RATIO: 1.5 (ref 1.1–2.5)
ALK PHOS: 71 IU/L (ref 39–117)
ALT: 16 IU/L (ref 0–32)
AST: 20 IU/L (ref 0–40)
Albumin: 4.1 g/dL (ref 3.6–4.8)
BUN / CREAT RATIO: 14 (ref 11–26)
BUN: 13 mg/dL (ref 8–27)
CO2: 27 mmol/L (ref 18–29)
CREATININE: 0.96 mg/dL (ref 0.57–1.00)
Calcium: 9.5 mg/dL (ref 8.7–10.3)
Chloride: 102 mmol/L (ref 97–108)
GFR calc Af Amer: 72 mL/min/{1.73_m2} (ref >59)
GFR, EST NON AFRICAN AMERICAN: 62 mL/min/{1.73_m2} (ref >59)
GLOBULIN, TOTAL: 2.8 g/dL (ref 1.5–4.5)
GLUCOSE: 98 mg/dL (ref 65–99)
Potassium: 4.4 mmol/L (ref 3.5–5.2)
Sodium: 141 mmol/L (ref 134–144)
TOTAL PROTEIN: 6.9 g/dL (ref 6.0–8.5)
Total Bilirubin: 0.3 mg/dL (ref 0.0–1.2)

## 2013-12-24 LAB — NMR, LIPOPROFILE
CHOLESTEROL: 170 mg/dL (ref 100–199)
HDL Cholesterol by NMR: 40 mg/dL (ref >39)
HDL PARTICLE NUMBER: 28.7 umol/L — AB (ref >=30.5)
LDL PARTICLE NUMBER: 1325 nmol/L — AB (ref <1000)
LDL SIZE: 20.8 nm (ref >20.5)
LDL-C: 105 mg/dL — ABNORMAL HIGH (ref 0–99)
LP-IR SCORE: 65 — AB (ref <=45)
SMALL LDL PARTICLE NUMBER: 689 nmol/L — AB (ref <=527)
TRIGLYCERIDES BY NMR: 127 mg/dL (ref 0–149)

## 2013-12-24 LAB — PAP IG (IMAGE GUIDED): PAP SMEAR COMMENT: 0

## 2013-12-24 LAB — VITAMIN D 25 HYDROXY (VIT D DEFICIENCY, FRACTURES): Vit D, 25-Hydroxy: 27.7 ng/mL — ABNORMAL LOW (ref 30.0–100.0)

## 2013-12-29 ENCOUNTER — Telehealth: Payer: Self-pay

## 2013-12-29 NOTE — Telephone Encounter (Signed)
Stp advised no openings tonight but to call in the morning to see if we had any cancellations, pt voiced understanding. Will close encounter.

## 2013-12-29 NOTE — Telephone Encounter (Signed)
When last seen by MMM Mentioned that my hip hurt  It hurts more now   What to do?

## 2013-12-31 ENCOUNTER — Telehealth: Payer: Self-pay | Admitting: Nurse Practitioner

## 2013-12-31 NOTE — Telephone Encounter (Signed)
Patient aware.

## 2013-12-31 NOTE — Telephone Encounter (Signed)
-----   Message from [employer], [place] sent at 12/31/2013  8:07 AM EST ----- Cbc normal Kidney and liver function stable ldl particle numbers are a little high- watch fats in diet Thyroid panel normal Vitamin d low- vitamin D 2000iu OTC daily Pap normal- repeat in 2 years Continue current meds- low fat diet and exercise and recheck in 3 months

## 2014-01-09 ENCOUNTER — Encounter: Payer: Self-pay | Admitting: Nurse Practitioner

## 2014-01-09 ENCOUNTER — Ambulatory Visit (INDEPENDENT_AMBULATORY_CARE_PROVIDER_SITE_OTHER): Payer: Medicare Other | Admitting: Nurse Practitioner

## 2014-01-09 ENCOUNTER — Ambulatory Visit (INDEPENDENT_AMBULATORY_CARE_PROVIDER_SITE_OTHER): Payer: Medicare Other

## 2014-01-09 VITALS — BP 127/84 | HR 75 | Temp 98.6°F | Ht 66.0 in | Wt 195.0 lb

## 2014-01-09 DIAGNOSIS — M25551 Pain in right hip: Secondary | ICD-10-CM

## 2014-01-09 DIAGNOSIS — M5431 Sciatica, right side: Secondary | ICD-10-CM | POA: Diagnosis not present

## 2014-01-09 MED ORDER — PREDNISONE 20 MG PO TABS: ORAL_TABLET | ORAL | Status: DC

## 2014-01-09 MED ORDER — METHYLPREDNISOLONE ACETATE 80 MG/ML IJ SUSP
80.0000 mg | Freq: Once | INTRAMUSCULAR | Status: AC
  Administered 2014-01-09: 80 mg via INTRAMUSCULAR

## 2014-01-09 NOTE — Progress Notes (Signed)
   Subjective:    Patient ID: Laura James, female    DOB: [birth year], 63 y.o.   MRN: [id number]  HPI Patient was seen December 1,2064for complete physical exam- at that time she complained of right hip pain- said that she had been working outside and felt a pop in her hip. She was not given nothing and it has gotten no better. SHe has tried tylenol arthritis strength which eases the pain some. The pain is worse when she tries to stand. Sitting still alleviates the pain a little- walking helps pain. Currently rates pain 6/10.    Review of Systems  Eyes: Negative.   Respiratory: Negative.   Neurological: Negative.   Psychiatric/Behavioral: Negative.   All other systems reviewed and are negative.      Objective:   Physical Exam  Constitutional: She is oriented to person, place, and time.  Cardiovascular: Normal rate, regular rhythm and normal heart sounds.   Pulmonary/Chest: Effort normal and breath sounds normal.  Musculoskeletal:  Pain on abduction and external rotation of right hip. No pain on palpation. Pain on palpation right buttocks with pain radiating down to knee.  Neurological: She is alert and oriented to person, place, and time. She has normal reflexes.  Skin: Skin is warm.  Psychiatric: She has a normal mood and affect. Her behavior is normal. Judgment and thought content normal.   BP 127/84 mmHg  Pulse 75  Temp(Src) 98.6 F (37 C) (Oral)  Ht 5\' 6"  (1.676 m)  Wt 195 lb (88.451 kg)  BMI 31.49 kg/m2  Right hip xray- normal appearance-Preliminary reading by Ronnald Collum, FNP  [facility]       Assessment & Plan:   1. Hip pain, right   2. Sciatica, right    Meds ordered this encounter  Medications  . methylPREDNISolone acetate (DEPO-MEDROL) injection 80 mg    Sig:   . predniSONE (DELTASONE) 20 MG tablet    Sig: 2 Tablets PO at the same time daily for 5 days- do not start until tomorrow    Dispense:  10 tablet    Refill:  0    Order Specific Question:  Supervising  Provider    Answer:  Chipper Herb [[id number]]   Moist heat Rest RTO prn  Mary-Margaret Hassell Done, FNP

## 2014-01-09 NOTE — Patient Instructions (Signed)
Sciatica Sciatica is pain, weakness, numbness, or tingling along the path of the sciatic nerve. The nerve starts in the lower back and runs down the back of each leg. The nerve controls the muscles in the lower leg and in the back of the knee, while also providing sensation to the back of the thigh, lower leg, and the sole of your foot. Sciatica is a symptom of another medical condition. For instance, nerve damage or certain conditions, such as a herniated disk or bone spur on the spine, pinch or put pressure on the sciatic nerve. This causes the pain, weakness, or other sensations normally associated with sciatica. Generally, sciatica only affects one side of the body. CAUSES   Herniated or slipped disc.  Degenerative disk disease.  A pain disorder involving the narrow muscle in the buttocks (piriformis syndrome).  Pelvic injury or fracture.  Pregnancy.  Tumor (rare). SYMPTOMS  Symptoms can vary from mild to very severe. The symptoms usually travel from the low back to the buttocks and down the back of the leg. Symptoms can include:  Mild tingling or dull aches in the lower back, leg, or hip.  Numbness in the back of the calf or sole of the foot.  Burning sensations in the lower back, leg, or hip.  Sharp pains in the lower back, leg, or hip.  Leg weakness.  Severe back pain inhibiting movement. These symptoms may get worse with coughing, sneezing, laughing, or prolonged sitting or standing. Also, being overweight may worsen symptoms. DIAGNOSIS  Your caregiver will perform a physical exam to look for common symptoms of sciatica. He or she may ask you to do certain movements or activities that would trigger sciatic nerve pain. Other tests may be performed to find the cause of the sciatica. These may include:  Blood tests.  X-rays.  Imaging tests, such as an MRI or CT scan. TREATMENT  Treatment is directed at the cause of the sciatic pain. Sometimes, treatment is not necessary  and the pain and discomfort goes away on its own. If treatment is needed, your caregiver may suggest:  Over-the-counter medicines to relieve pain.  Prescription medicines, such as anti-inflammatory medicine, muscle relaxants, or narcotics.  Applying heat or ice to the painful area.  Steroid injections to lessen pain, irritation, and inflammation around the nerve.  Reducing activity during periods of pain.  Exercising and stretching to strengthen your abdomen and improve flexibility of your spine. Your caregiver may suggest losing weight if the extra weight makes the back pain worse.  Physical therapy.  Surgery to eliminate what is pressing or pinching the nerve, such as a bone spur or part of a herniated disk. HOME CARE INSTRUCTIONS   Only take over-the-counter or prescription medicines for pain or discomfort as directed by your caregiver.  Apply ice to the affected area for 20 minutes, 3-4 times a day for the first 48-72 hours. Then try heat in the same way.  Exercise, stretch, or perform your usual activities if these do not aggravate your pain.  Attend physical therapy sessions as directed by your caregiver.  Keep all follow-up appointments as directed by your caregiver.  Do not wear high heels or shoes that do not provide proper support.  Check your mattress to see if it is too soft. A firm mattress may lessen your pain and discomfort. SEEK IMMEDIATE MEDICAL CARE IF:   You lose control of your bowel or bladder (incontinence).  You have increasing weakness in the lower back, pelvis, buttocks,   or legs.  You have redness or swelling of your back.  You have a burning sensation when you urinate.  You have pain that gets worse when you lie down or awakens you at night.  Your pain is worse than you have experienced in the past.  Your pain is lasting longer than 4 weeks.  You are suddenly losing weight without reason. MAKE SURE YOU:  Understand these  instructions.  Will watch your condition.  Will get help right away if you are not doing well or get worse. Document Released: 01/03/2001 Document Revised: 07/11/2011 Document Reviewed: 05/21/2011 Exit[employer] Patient Information 2015 [employer]. This information is not intended to replace advice given to you by your health care provider. Make sure you discuss any questions you have with your health care provider.  

## 2014-01-30 ENCOUNTER — Ambulatory Visit (INDEPENDENT_AMBULATORY_CARE_PROVIDER_SITE_OTHER): Payer: Medicare Other | Admitting: Cardiology

## 2014-01-30 ENCOUNTER — Encounter: Payer: Self-pay | Admitting: Cardiology

## 2014-01-30 VITALS — BP 140/80 | HR 61 | Ht 66.0 in | Wt 194.9 lb

## 2014-01-30 DIAGNOSIS — R5382 Chronic fatigue, unspecified: Secondary | ICD-10-CM | POA: Diagnosis not present

## 2014-01-30 NOTE — Patient Instructions (Addendum)
Your physician recommends that you schedule a follow-up appointment 2 months after you see Dr. Claiborne Billings and you have your split night sleep study

## 2014-01-30 NOTE — Progress Notes (Signed)
HPI The patient presents for evaluation of fatigue and bradycardia. She is not having history of cardiac disease. In fact she apparently had a workup by Dr. Einar Gip for treatment of some right-sided chest discomfort. However, I don't have these records. She was noted to have bradycardia recently. Because of this and her fatigued she was sent to me.  I do note that she's had a moderately positive sleep study in 2014 and she's not had any followup of this. She says she is fatigued and feels like she doesn't sleep well. She wakes up tired. She could walk a mile but not 2 miles which she use to. She's exhausted by the end of the day which is new over the course of a few months. She has had some right-sided chest discomfort. This was evaluated previously as mentioned. She thinks she had a stress test. This happens when she wakes up or it wakes her up. However, it is not reproducible with exertion.  She's not describing associated symptoms such as nausea vomiting or diaphoresis. She's had no new palpitations, presyncope or syncope. She has no PND or orthopnea.    Allergies  Allergen Reactions  . Adhesive [Tape]   . Latex Rash    Current Outpatient Prescriptions  Medication Sig Dispense Refill  . aspirin 81 MG tablet Take 81 mg by mouth daily.      Marland Kitchen atenolol (TENORMIN) 25 MG tablet TAKE 1 TABLET (25 MG TOTAL) BY MOUTH DAILY. 90 tablet 1  . cholecalciferol (VITAMIN D-400) 400 UNITS TABS tablet Take 400 Units by mouth.    . Coenzyme Q10 (CO Q 10 PO) Take 1 capsule by mouth daily.    . fish oil-omega-3 fatty acids 1000 MG capsule Take 2 g by mouth daily.      . hydrochlorothiazide (HYDRODIURIL) 25 MG tablet Take 1 tablet (25 mg total) by mouth daily. 30 tablet 5  . Multiple Vitamin (MULTIVITAMIN) tablet Take 1 tablet by mouth daily.      Marland Kitchen omeprazole (PRILOSEC) 40 MG capsule Take 1 capsule (40 mg total) by mouth daily. (Patient taking differently: Take 40 mg by mouth as needed. ) 30 capsule 3   No  current facility-administered medications for this visit.    Past Medical History  Diagnosis Date  . IBS (irritable bowel syndrome)   . 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 Esophageal reflux   . Depressive disorder, not elsewhere classified   . Obesity   . Barrett esophagus   . Grave's disease   . Hypothyroidism   . Osteopenia   . Symptomatic menopausal or female climacteric states   . Sleep apnea   . Diverticulosis of colon (without mention of hemorrhage)   . Gastritis   . Essential hypertension, benign   . Hiatal hernia     Past Surgical History  Procedure Laterality Date  . Abdominal hysterectomy    . Tonsillectomy    . Cholecystectomy    . Incontinence surgery      Family History  Problem Relation Age of Onset  . Diabetes    . Colon cancer Neg Hx   . Clotting disorder    . Breast cancer      aunt  . Lung cancer Father     History   Social History  . Marital Status: Married    Spouse Name: N/A    Number of Children: 3  . Years of Education: N/A   Occupational History  .  hospice     [employer]   Social History Main Topics  . Smoking status: Former Research scientist (life sciences)  . Smokeless tobacco: Never Used  . Alcohol Use: No  . Drug Use: No  . Sexual Activity: Not on file   Other Topics Concern  . Not on file   Social History Narrative    ROS:    As stated in the HPI and negative for all other systems.  PHYSICAL EXAM BP 140/80 mmHg  Pulse 61  Ht 5\' 6"  (1.676 m)  Wt 194 lb 14.4 oz (88.406 kg)  BMI 31.47 kg/m2  GENERAL:  Well appearing HEENT:  Pupils equal round and reactive, fundi not visualized, oral mucosa unremarkable NECK:  No jugular venous distention, waveform within normal limits, carotid upstroke brisk and symmetric, no bruits, no thyromegaly LYMPHATICS:  No cervical, inguinal adenopathy LUNGS:  Clear to auscultation bilaterally BACK:  No CVA tenderness CHEST:  Unremarkable HEART:  PMI not  displaced or sustained,S1 and S2 within normal limits, no S3, no S4, no clicks, no rubs, no murmurs ABD:  Flat, positive bowel sounds normal in frequency in pitch, no bruits, no rebound, no guarding, no midline pulsatile mass, no hepatomegaly, no splenomegaly EXT:  2 plus pulses throughout, no edema, no cyanosis no clubbing SKIN:  No rashes no nodules NEURO:  Cranial nerves II through XII grossly intact, motor grossly intact throughout PSYCH:  Cognitively intact, oriented to person place and time   EKG:  Sinus rhythm, rate 61, axis within normal limits intervals within normal limits, nonspecific diffuse T wave flattening.  01/30/2014   ASSESSMENT AND PLAN  FATIGUE:  With a history of a positive sleep study and her current symptoms she should first be treated for sleep apnea. I do note that her thyroid and CBC were recently normal. I would not suggest that her bradycardia is the primary cause of her fatigue may be related to sleep apnea. However, I would probably followup with a Holter to make sure she has normal heart rate response with activity.  SLEEP APNEA:  I will send her for a split night sleep study to confirm the previous diagnosis and further move her along in therapy.  BRADYCARDIA:  As above.  Of note her heart rate did go into the 80s with ambulation around the office.  CHEST PAIN:  This is atypical.  I will review her recent work up at another office rather than repeating studies

## 2014-03-11 ENCOUNTER — Other Ambulatory Visit: Payer: Self-pay | Admitting: *Deleted

## 2014-03-11 DIAGNOSIS — I10 Essential (primary) hypertension: Secondary | ICD-10-CM

## 2014-03-11 MED ORDER — HYDROCHLOROTHIAZIDE 25 MG PO TABS: 25.0000 mg | ORAL_TABLET | Freq: Every day | ORAL | Status: DC

## 2014-03-23 ENCOUNTER — Encounter (HOSPITAL_BASED_OUTPATIENT_CLINIC_OR_DEPARTMENT_OTHER): Payer: No Typology Code available for payment source

## 2014-04-02 ENCOUNTER — Ambulatory Visit (HOSPITAL_BASED_OUTPATIENT_CLINIC_OR_DEPARTMENT_OTHER): Payer: Medicare Other | Attending: Cardiology | Admitting: *Deleted

## 2014-04-02 VITALS — Ht 67.0 in | Wt 194.0 lb

## 2014-04-02 DIAGNOSIS — G4733 Obstructive sleep apnea (adult) (pediatric): Secondary | ICD-10-CM | POA: Diagnosis not present

## 2014-04-02 DIAGNOSIS — R5382 Chronic fatigue, unspecified: Secondary | ICD-10-CM | POA: Insufficient documentation

## 2014-04-08 NOTE — Sleep Study (Signed)
NAME: Laura James DATE OF BIRTH:  Mar 30, 1950 MEDICAL RECORD NUMBER [id number]  LOCATION: [facility]  PHYSICIAN: KELLY,THOMAS A  DATE OF STUDY: 04/02/2014  SLEEP STUDY TYPE: Split Night/Positive Airway Pressure Titration               REFERRING PHYSICIAN: Minus Breeding, MD  INDICATION FOR STUDY:  Ms. Laura James is a 64 year old female who is referred by Dr. Oran James line for a sleep study to evaluate for sleep apnea.  Patient admits to loud snoring, witnessed apnea, limb movements, and daytime sleepiness.  EPWORTH SLEEPINESS SCORE:  12, which is positive for excessive daytime sleepiness HEIGHT: 5\' 7"  (170.2 cm)  WEIGHT: 194 lb (87.998 kg)    Body mass index is 30.38 kg/(m^2).  NECK SIZE: 14.5 in.  MEDICATIONS:  omeprazole (PRILOSEC) 40 MG capsule 40 mg, Daily  Patient taking differently: 40 mg Oral As needed, Reported on 01/30/2014  Multiple Vitamin (MULTIVITAMIN) tablet 1 tablet, Daily hydrochlorothiazide (HYDRODIURIL) 25 MG tablet 25 mg, Daily fish oil-omega-3 fatty acids 1000 MG capsule 2 g, Daily Coenzyme Q10 (CO Q 10 PO) 1 capsule, Daily cholecalciferol (VITAMIN D-400) 400 UNITS TABS tablet 400 Units atenolol (TENORMIN) 25 MG tablet aspirin 81 MG tablet 81 mg, Daily Procedures Ordered This Visit   SLEEP ARCHITECTURE:  On the diagnostic portion of the study, the patient slept for 145 minutes at a sleep period of time of 157 minutes, giving a sleep efficiency at 90.9%.  Sleep latency was 2.5 minutes.  Latency to REM sleep was 60.5 minutes.  She had 10 arousals with an index of 4.1.  There was moderately severe snoring.  On the titration portion of the study, the patient slept for 159 minutes at a sleep period of time of 196 minutes.  Sleep efficiency was 77.4%.  There were 34 arousals with an index of 12.8.  There was complete resolution of snoring with CPAP therapy.  RESPIRATORY DATA:  On the diagnostic evaluation, there were 19 obstructive apneas, 0 central  apneas, 0 mixed apneas, and 19 hypopneas.  The apnea hypopnea index (AHI) was 15.7 per hour which places the patient in the moderate sleep apnea category.  However, the majority of events occurred during REM sleep and the AHI in REM sleep was very severe at 100 per hour.  CPAP was started at 5 cm water pressure and was titrated to 15 cm pressure.  The AHI at 15 cm water pressure was 2.6 with a total of 7 arousals.  Events were worse with supine sleep.  The patient was able to achieve REM supine sleep at this pressure.  OXYGEN DATA:  The baseline oxygen saturation was 95%.  The lowest oxygen desaturation was 88% with non-REM sleep and 81% with REM sleep.  With CPAP therapy, the lowest oxygen saturation was 85% at 10 cm water pressure.  CARDIAC DATA:  The patient was in sinus rhythm throughout the study with an average heart rate at 62 bpm.  MOVEMENT/PARASOMNIA:  There were 0 periodic limb movements.  IMPRESSION/ RECOMMENDATION:   Moderate sleep apnea/hypopnea syndrome, overall with very severe sleep apnea during REM sleep. Respiratory events with frequent desaturation to a nadir of 81% during REM sleep. Moderately severe snoring on the baseline portion of the study with resolution of snoring with CPAP therapy.  Recommend initial use of CPAP with C-Flex/APR of 3 at 15 cm water pressure with a heated humidifier.  The patient used a Respironics Air-Fit F10 fullface mask, size small during the  study. Effort should be made to optimize nasal and oral pharyngeal patency.  The patient should be counseled in both good sleep hygiene as well as weight loss. The patient should be counseled to try voiding sleep in the supine position. Recommend download in 30 days and sleep clinic evaluation.    [place], American Board of Sleep Medicine  ELECTRONICALLY SIGNED ON:  04/08/2014, 6:33 PM [place] PH: [phone]   FX: [phone] [place]

## 2014-04-08 NOTE — Addendum Note (Signed)
Addended by: Shelva Majestic A on: 04/08/2014 06:57 PM   Modules accepted: Level of Service

## 2014-04-09 ENCOUNTER — Telehealth: Payer: Self-pay | Admitting: Cardiology

## 2014-04-09 NOTE — Telephone Encounter (Signed)
Close encounter 

## 2014-04-14 ENCOUNTER — Telehealth: Payer: Self-pay | Admitting: *Deleted

## 2014-04-14 NOTE — Telephone Encounter (Signed)
Please call the patient with results. She does have sleep apnea. Please send results to PCP and schedule an appt with Dr. Claiborne Billings.     ----- Message -----    From: Troy Sine, MD    Sent: 04/08/2014  6:55 PM     To: Minus Breeding, MD, Mary-Margaret Hassell Done, FNP    pt aware of results  Follow up scheduled with dr Claiborne Billings

## 2014-04-16 ENCOUNTER — Telehealth: Payer: Self-pay | Admitting: Cardiovascular Disease

## 2014-04-16 ENCOUNTER — Telehealth: Payer: Self-pay | Admitting: *Deleted

## 2014-04-16 NOTE — Telephone Encounter (Signed)
Returning call to [place] from this morning.

## 2014-04-16 NOTE — Telephone Encounter (Signed)
Attempted to contact patient to discuss her sleep study and set up.

## 2014-04-16 NOTE — Telephone Encounter (Signed)
Faxed CPAP referral with documents to [facility] supply.

## 2014-04-16 NOTE — Telephone Encounter (Signed)
Returned a call to patient informing her that the referral to [facility] has been done. Once benefits are approved they will contact her for set up. The April appointment with Dr. Claiborne Billings will be cancelled.

## 2014-04-27 ENCOUNTER — Telehealth: Payer: Self-pay | Admitting: *Deleted

## 2014-04-27 NOTE — Telephone Encounter (Signed)
Faxed CPAP supply order to [facility]. 

## 2014-05-07 ENCOUNTER — Ambulatory Visit: Payer: No Typology Code available for payment source | Admitting: Cardiovascular Disease

## 2014-07-06 ENCOUNTER — Ambulatory Visit: Payer: No Typology Code available for payment source | Admitting: Cardiovascular Disease

## 2014-07-06 ENCOUNTER — Ambulatory Visit (INDEPENDENT_AMBULATORY_CARE_PROVIDER_SITE_OTHER): Payer: Medicare Other | Admitting: Cardiovascular Disease

## 2014-07-06 ENCOUNTER — Encounter: Payer: Self-pay | Admitting: Cardiovascular Disease

## 2014-07-06 VITALS — BP 120/82 | HR 58 | Ht 64.0 in | Wt 199.0 lb

## 2014-07-06 DIAGNOSIS — R5383 Other fatigue: Secondary | ICD-10-CM

## 2014-07-06 DIAGNOSIS — G4733 Obstructive sleep apnea (adult) (pediatric): Secondary | ICD-10-CM

## 2014-07-06 DIAGNOSIS — I10 Essential (primary) hypertension: Secondary | ICD-10-CM | POA: Diagnosis not present

## 2014-07-06 NOTE — Patient Instructions (Signed)
Your physician wants you to follow-up in: 1 year with Dr. Claiborne Billings for your sleep therapy. You will receive a reminder letter in the mail two months in advance. If you don't receive a letter, please call our office to schedule the follow-up appointment.

## 2014-07-08 ENCOUNTER — Encounter: Payer: Self-pay | Admitting: Cardiovascular Disease

## 2014-07-08 DIAGNOSIS — R5383 Other fatigue: Secondary | ICD-10-CM | POA: Insufficient documentation

## 2014-07-08 NOTE — Progress Notes (Signed)
Patient ID: Laura James, female   DOB: Oct 31, 1950, 64 y.o.   MRN: [id number]     HPI: Laura James is a 64 y.o. female who is a patient of Dr. Percival Spanish and presents to sleep clinic following initiation of CPAP therapy.  [name] was seen by Dr. Oran Rein right in January 2016 and had major complaints of fatigue.  Reportedly, she had a remote sleep study, but had never had follow-up assessment.  She was referred for a split-night protocol, which was done at [facility] on 04/02/2014.  This reconfirmed sleep apnea with moderate sleep apnea/hypoxia syndrome with an AHI 15.7 per hour overall.  However, she had very severe sleep apnea during REM sleep with an AHI of 100 per hour.  CPAP was started at 5 cm water pressure and was titrated up to 15 cm.  A15 cm water pressure was recommended.  On the diagnostic portion of the study, she had nocturnal oxygen desaturation to 81% during grams sleep.  She CPAP set up at home on 05/08/2014.  She is using a ResMed air foot F 10 full face mask, size small.  Subjectively, since initiating therapy.  She feels significantly better.  Her snoring has completely resolved.  She typically goes to bed at 11 PM and wakes up at 6:30 AM.  Her sleep is now restorative.  A download was obtained from 05/08/2014 through 06/06/2014.  She is meeting Medicare compliance standards with 90% of days with usage and 80% of days greater than 4 hours of use.  She is averaging 5 hours and 48 minutes per night of use.  At her fix set pressure of 15 cm with an EPR of 3, her AHI is excellent at 1.0.  There is minimal leak.  At the time of her initial study, her Epworth sleepiness scale score was 12.  This was recalculated today and endorsed at 10 as noted below.  Epworth Sleepiness Scale: Situation   Chance of Dozing/Sleeping (0 = never , 1 = slight chance , 2 = moderate chance , 3 = high chance )   sitting and reading 3   watching TV 1   sitting inactive in a public place 0   being a passenger in  a motor vehicle for an hour or more 1   lying down in the afternoon 2   sitting and talking to someone 1   sitting quietly after lunch (no alcohol) 2   while stopped for a few minutes in traffic as the driver 0   Total Score  10    Past Medical History  Diagnosis Date  . IBS (irritable bowel syndrome)   . 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 Esophageal reflux   . Depressive disorder, not elsewhere classified   . Obesity   . Barrett esophagus   . Grave's disease   . Hypothyroidism   . Osteopenia   . Symptomatic menopausal or female climacteric states   . Sleep apnea   . Diverticulosis of colon (without mention of hemorrhage)   . Gastritis   . Essential hypertension, benign   . Hiatal hernia     Past Surgical History  Procedure Laterality Date  . Abdominal hysterectomy    . Tonsillectomy    . Cholecystectomy    . Incontinence surgery      Allergies  Allergen Reactions  . Adhesive [Tape]   . Latex Rash    Current Outpatient Prescriptions  Medication Sig Dispense  Refill  . aspirin 81 MG tablet Take 81 mg by mouth daily.      Marland Kitchen atenolol (TENORMIN) 25 MG tablet TAKE 1 TABLET (25 MG TOTAL) BY MOUTH DAILY. 90 tablet 1  . cholecalciferol (VITAMIN D-400) 400 UNITS TABS tablet Take 400 Units by mouth.    . Coenzyme Q10 (CO Q 10 PO) Take 1 capsule by mouth daily.    . fish oil-omega-3 fatty acids 1000 MG capsule Take 2 g by mouth daily.      . hydrochlorothiazide (HYDRODIURIL) 25 MG tablet Take 1 tablet (25 mg total) by mouth daily. 90 tablet 0  . Multiple Vitamin (MULTIVITAMIN) tablet Take 1 tablet by mouth daily.      Marland Kitchen omeprazole (PRILOSEC) 40 MG capsule Take 1 capsule (40 mg total) by mouth daily. (Patient taking differently: Take 40 mg by mouth as needed. ) 30 capsule 3   No current facility-administered medications for this visit.    History   Social History  . Marital Status: Married    Spouse Name: N/A  . Number of  Children: 3  . Years of Education: N/A   Occupational History  . [employer] History Main Topics  . Smoking status: Former Research scientist (life sciences)  . Smokeless tobacco: Never Used     Comment: Quit tobacco 1975  . Alcohol Use: No  . Drug Use: No  . Sexual Activity: Not on file   Other Topics Concern  . Not on file   Social History Narrative   Lives at home with husband and granddaughter.   They are rasing a great grandson.      Family History  Problem Relation Age of Onset  . Diabetes    . Colon cancer Neg Hx   . Clotting disorder    . Breast cancer      aunt  . Lung cancer Father   . Hypertension Mother   . Arthritis Mother      ROS General: Negative; No fevers, chills, or night sweats HEENT: Negative; No changes in vision or hearing, sinus congestion, difficulty swallowing Pulmonary: Negative; No cough, wheezing, shortness of breath, hemoptysis Cardiovascular: Negative; No chest pain, presyncope, syncope, palpatations GI: Negative; No nausea, vomiting, diarrhea, or abdominal pain GU: Negative; No dysuria, hematuria, or difficulty voiding Musculoskeletal: Negative; no myalgias, joint pain, or weakness Hematologic: Negative; no easy bruising, bleeding Endocrine: Negative; no heat/cold intolerance Neuro: Negative; no changes in balance, headaches Skin: Negative; No rashes or skin lesions Psychiatric: Negative; No behavioral problems, depression Sleep: See history of present illness.  No residual snoring or significant hypersomnolence; no bruxism, restless legs, hypnogognic hallucinations, no cataplexy   Physical Exam BP 120/82 mmHg  Pulse 58  Ht _0  (1.626 m)  Wt 199 lb (90.266 kg)  BMI 34.14 kg/m2  Wt Readings from Last 3 Encounters:  07/06/14 199 lb (90.266 kg)  04/02/14 194 lb (87.998 kg)  01/30/14 194 lb 14.4 oz (88.406 kg)   General: Alert, oriented, no distress.  Skin: normal turgor, no rashes HEENT: Normocephalic, atraumatic. Pupils  round and reactive; sclera anicteric; extraocular muscles intact; Fundi  without hemorrhages or exudates.  No AV nicking. Nose without nasal septal hypertrophy Mouth/Parynx benign; Mallinpatti scale 2/3 Neck: No JVD, no carotid briuts Lungs: clear to ausculatation and percussion; no wheezing or rales  Chest wall: No tenderness to palpation Heart: RRR, s1 s2 normal; no murmurs, rubs, thrills or heaves. Abdomen: soft, nontender; no hepatosplenomehaly, BS+; abdominal aorta nontender and  not dilated by palpation. Back: No CVA tenderness Pulses 2+ Extremities: no clubbing cyanosis or edema, Homan's sign negative  Neurologic: grossly nonfocal; cranial nerves intact. Psychological: Normal affect and mood.  ECG not done today  LABS:  BMP Latest Ref Rng 12/23/2013 05/20/2013 02/05/2012  Glucose 65 - 99 mg/dL 98 84 99  BUN 8 - 27 mg/dL _0 Creatinine 0.57 - 1.00 mg/dL 0.96 0.94 0.92  BUN/Creat Ratio 11 - _1 -  Sodium 134 - 144 mmol/L 141 143 139  Potassium 3.5 - 5.2 mmol/L 4.4 3.9 3.8  Chloride 97 - 108 mmol/L 102 100 99  CO2 18 - 29 mmol/L _2 Calcium 8.7 - 10.3 mg/dL 9.5 10.3 10.2     Hepatic Function Latest Ref Rng 12/23/2013 05/20/2013 02/05/2012  Total Protein 6.0 - 8.5 g/dL 6.9 7.1 7.7  Albumin 3.5 - 5.2 g/dL - - 3.6  AST 0 - 40 IU/L _3 ALT 0 - 32 IU/L _4 Alk Phosphatase 39 - 117 IU/L 71 78 81  Total Bilirubin 0.0 - 1.2 mg/dL 0.3 0.3 0.3  Bilirubin, Direct 0.0-0.3 mg/dL - - -     CBC Latest Ref Rng 12/23/2013 02/05/2012 04/16/2007  WBC 4.6 - 10.2 K/uL 7.6 12.2(H) 9.4  Hemoglobin 12.2 - 16.2 g/dL 13.2 13.9 13.2  Hematocrit 37.7 - 47.9 % 41.1 42.2 40.8  Platelets 150 - 400 K/uL - 328 377     Lipid Panel     Component Value Date/Time   CHOL 170 12/23/2013 0942   TRIG 127 12/23/2013 0942   HDL 40 12/23/2013 0942     RADIOLOGY: No results found.    ASSESSMENT AND PLAN: Laura James is a 64 year old female who had admitted to loud  snoring, witnessed apnea, lip movements, as well as daytime sleepiness.  Prior to her arrival for her split-night sleep study.  Her sleep study confirmed moderate sleep apnea but she had very severe sleep apnea during grams sleep with an HI of 100 per hour.  She had significant nocturnal desaturation to a nadir of 81% during grams sleep.  She did not appear to clip movements.  Since initiating CPAP therapy, she has noticed marked improvement in her sense of well-being.  She is less fatigued.  There is resolution of prior snoring.  She denies hypersomnolence.  She is tolerating CPAP well at 15 cm water pressure with a full face mask and her AHI is excellent at 1.0.  She is meeting Medicare compliance standards.  I discussed normal versus abnormal sleep architecture and reviewed her sleep study in detail with her.  I answered all her questions.  We discussed good sleep hygiene.  Her blood pressure today is stable.  She's not having any cardiac arrhythmia.  She will return to the primary cardiology care of Dr. Percival Spanish.  I will see her in one year for follow-up sleep evaluationhper Medicare mandate.   Time spent: 25 minutes  Troy Sine, MD, [facility]  07/08/2014 6:17 PM

## 2014-08-02 ENCOUNTER — Other Ambulatory Visit: Payer: Self-pay | Admitting: Nurse Practitioner

## 2014-08-13 ENCOUNTER — Ambulatory Visit: Payer: No Typology Code available for payment source | Admitting: Cardiovascular Disease

## 2014-08-13 ENCOUNTER — Ambulatory Visit (INDEPENDENT_AMBULATORY_CARE_PROVIDER_SITE_OTHER): Payer: Medicare Other | Admitting: Family

## 2014-08-13 ENCOUNTER — Encounter: Payer: Self-pay | Admitting: Family

## 2014-08-13 ENCOUNTER — Encounter (INDEPENDENT_AMBULATORY_CARE_PROVIDER_SITE_OTHER): Payer: Self-pay

## 2014-08-13 VITALS — BP 116/74 | HR 68 | Temp 97.4°F | Ht 64.0 in | Wt 197.0 lb

## 2014-08-13 DIAGNOSIS — R110 Nausea: Secondary | ICD-10-CM | POA: Diagnosis not present

## 2014-08-13 DIAGNOSIS — L918 Other hypertrophic disorders of the skin: Secondary | ICD-10-CM | POA: Diagnosis not present

## 2014-08-13 MED ORDER — ONDANSETRON HCL 4 MG PO TABS: 4.0000 mg | ORAL_TABLET | Freq: Three times a day (TID) | ORAL | Status: DC | PRN

## 2014-08-13 NOTE — Progress Notes (Signed)
   Subjective:    Patient ID: Laura James, female    DOB: [birth year], 64 y.o.   MRN: [id number]  HPI Pt presents to the office to look a lesion on her left cheek. Pt states it continue to scab over and not heal. Pt denies any pain, changes in shape, or color. Pt denies any trauma to that area. Pt reports feeling nauseas that started on Tuesday. Pt report it is unchanged and has tried Pepto bismol, ginger with relief. Pt denies any vomiting, fever, diarrhea, or GERD.   Review of Systems  Constitutional: Negative.   HENT: Negative.   Eyes: Negative.   Respiratory: Negative.  Negative for shortness of breath.   Cardiovascular: Negative.  Negative for palpitations.  Gastrointestinal: Negative.   Endocrine: Negative.   Genitourinary: Negative.   Musculoskeletal: Negative.   Neurological: Negative.  Negative for headaches.  Hematological: Negative.   Psychiatric/Behavioral: Negative.   All other systems reviewed and are negative.      Objective:   Physical Exam  Constitutional: She is oriented to person, place, and time. She appears well-developed and well-nourished. No distress.  HENT:  Head: Normocephalic and atraumatic.  Right Ear: External ear normal.  Mouth/Throat: Oropharynx is clear and moist.  Eyes: Pupils are equal, round, and reactive to light.  Neck: Normal range of motion. Neck supple. No thyromegaly present.  Cardiovascular: Normal rate, regular rhythm, normal heart sounds and intact distal pulses.   No murmur heard. Pulmonary/Chest: Effort normal and breath sounds normal. No respiratory distress. She has no wheezes.  Abdominal: Soft. Bowel sounds are normal. She exhibits no distension. There is no tenderness.  Musculoskeletal: Normal range of motion. She exhibits no edema or tenderness.  Neurological: She is alert and oriented to person, place, and time. She has normal reflexes. No cranial nerve deficit.  Skin: Skin is warm and dry.  Small skin tag on left lower  cheek   Psychiatric: She has a normal mood and affect. Her behavior is normal. Judgment and thought content normal.  Vitals reviewed.   BP 116/74 mmHg  Pulse 68  Temp(Src) 97.4 F (36.3 C) (Oral)  Ht 5\' 4"  (1.626 m)  Wt 197 lb (89.359 kg)  BMI 33.80 kg/m2       Assessment & Plan:  1. Skin tag -Do not pick at  2. Nausea without vomiting -Small meals -Tylenol prn for fevers -Force fluids -Bland diet -RTO prn - ondansetron (ZOFRAN) 4 MG tablet; Take 1 tablet (4 mg total) by mouth every 8 (eight) hours as needed for nausea or vomiting.  Dispense: 60 tablet; Refill: [place], FNP

## 2014-08-13 NOTE — Patient Instructions (Signed)

## 2014-09-09 DIAGNOSIS — H2513 Age-related nuclear cataract, bilateral: Secondary | ICD-10-CM | POA: Diagnosis not present

## 2014-09-09 DIAGNOSIS — H04123 Dry eye syndrome of bilateral lacrimal glands: Secondary | ICD-10-CM | POA: Diagnosis not present

## 2014-09-10 ENCOUNTER — Ambulatory Visit (INDEPENDENT_AMBULATORY_CARE_PROVIDER_SITE_OTHER): Payer: Medicare Other | Admitting: Family

## 2014-09-10 ENCOUNTER — Encounter: Payer: Self-pay | Admitting: Family

## 2014-09-10 VITALS — BP 116/77 | HR 56 | Temp 97.5°F | Ht 64.0 in | Wt 199.4 lb

## 2014-09-10 DIAGNOSIS — N751 Abscess of Bartholin's gland: Secondary | ICD-10-CM

## 2014-09-10 DIAGNOSIS — L0291 Cutaneous abscess, unspecified: Secondary | ICD-10-CM | POA: Diagnosis not present

## 2014-09-10 MED ORDER — SULFAMETHOXAZOLE-TRIMETHOPRIM 800-160 MG PO TABS: 1.0000 | ORAL_TABLET | Freq: Two times a day (BID) | ORAL | Status: DC

## 2014-09-10 MED ORDER — FLUCONAZOLE 150 MG PO TABS: 150.0000 mg | ORAL_TABLET | ORAL | Status: DC

## 2014-09-10 NOTE — Progress Notes (Signed)
   Subjective:    Patient ID: Laura James, female    DOB: [birth year], 64 y.o.   MRN: [id number]  HPI Pt presents to the office today for an abscess on her right labia. Pt states she noticed it about three weeks ago and it continues to wax and waning. Pt states it has "popped and drained pus and blood". Pt states then after it drains it goes down, but then continues to grow. Pt states she has applied A&D with no relief.     Review of Systems  Constitutional: Negative.   HENT: Negative.   Eyes: Negative.   Respiratory: Negative.  Negative for shortness of breath.   Cardiovascular: Negative.  Negative for palpitations.  Gastrointestinal: Negative.   Endocrine: Negative.   Genitourinary: Negative.   Musculoskeletal: Negative.   Neurological: Negative.  Negative for headaches.  Hematological: Negative.   Psychiatric/Behavioral: Negative.   All other systems reviewed and are negative.      Objective:   Physical Exam  Constitutional: She is oriented to person, place, and time. She appears well-developed and well-nourished. No distress.  HENT:  Head: Normocephalic and atraumatic.  Right Ear: External ear normal.  Mouth/Throat: Oropharynx is clear and moist.  Eyes: Pupils are equal, round, and reactive to light.  Neck: Normal range of motion. Neck supple. No thyromegaly present.  Cardiovascular: Normal rate, regular rhythm, normal heart sounds and intact distal pulses.   No murmur heard. Pulmonary/Chest: Effort normal and breath sounds normal. No respiratory distress. She has no wheezes.  Abdominal: Soft. Bowel sounds are normal. She exhibits no distension. There is no tenderness.  Genitourinary:  Small abscess on right outer labia  Musculoskeletal: Normal range of motion. She exhibits no edema or tenderness.  Neurological: She is alert and oriented to person, place, and time. She has normal reflexes. No cranial nerve deficit.  Skin: Skin is warm and dry.  Psychiatric: She has a  normal mood and affect. Her behavior is normal. Judgment and thought content normal.  Vitals reviewed.   BP 116/77 mmHg  Pulse 56  Temp(Src) 97.5 F (36.4 C) (Oral)  Ht 5\' 4"  (1.626 m)  Wt 199 lb 6.4 oz (90.447 kg)  BMI 34.21 kg/m2       Assessment & Plan:  1. Abscess  - sulfamethoxazole-trimethoprim (BACTRIM DS,SEPTRA DS) 800-160 MG per tablet; Take 1 tablet by mouth 2 (two) times daily.  Dispense: 14 tablet; Refill: 0  2. Bartholin's gland abscess  Warm compresses Finish antibiotic Do not pick or squeeze  RTO prn   Evelina Dun, FNP

## 2014-09-10 NOTE — Patient Instructions (Addendum)
Abscess An abscess is an infected area that contains a collection of pus and debris.It can occur in almost any part of the body. An abscess is also known as a furuncle or boil. CAUSES  An abscess occurs when tissue gets infected. This can occur from blockage of oil or sweat glands, infection of hair follicles, or a minor injury to the skin. As the body tries to fight the infection, pus collects in the area and creates pressure under the skin. This pressure causes pain. People with weakened immune systems have difficulty fighting infections and get certain abscesses more often.  SYMPTOMS Usually an abscess develops on the skin and becomes a painful mass that is red, warm, and tender. If the abscess forms under the skin, you may feel a moveable soft area under the skin. Some abscesses break open (rupture) on their own, but most will continue to get worse without care. The infection can spread deeper into the body and eventually into the bloodstream, causing you to feel ill.  DIAGNOSIS  Your caregiver will take your medical history and perform a physical exam. A sample of fluid may also be taken from the abscess to determine what is causing your infection. TREATMENT  Your caregiver may prescribe antibiotic medicines to fight the infection. However, taking antibiotics alone usually does not cure an abscess. Your caregiver may need to make a small cut (incision) in the abscess to drain the pus. In some cases, gauze is packed into the abscess to reduce pain and to continue draining the area. HOME CARE INSTRUCTIONS   Only take over-the-counter or prescription medicines for pain, discomfort, or fever as directed by your caregiver.  If you were prescribed antibiotics, take them as directed. Finish them even if you start to feel better.  If gauze is used, follow your caregiver's directions for changing the gauze.  To avoid spreading the infection:  Keep your draining abscess covered with a  bandage.  Wash your hands well.  Do not share personal care items, towels, or whirlpools with others.  Avoid skin contact with others.  Keep your skin and clothes clean around the abscess.  Keep all follow-up appointments as directed by your caregiver. SEEK MEDICAL CARE IF:   You have increased pain, swelling, redness, fluid drainage, or bleeding.  You have muscle aches, chills, or a general ill feeling.  You have a fever. MAKE SURE YOU:   Understand these instructions.  Will watch your condition.  Will get help right away if you are not doing well or get worse. Document Released: 10/19/2004 Document Revised: 07/11/2011 Document Reviewed: 03/24/2011 [facility] Patient Information 2015 [place], [place]. This information is not intended to replace advice given to you by your health care provider. Make sure you discuss any questions you have with your health care provider. Bartholin's Cyst or Abscess Bartholin's glands are small glands located within the folds of skin (labia) along the sides of the lower opening of the vagina (birth canal). A cyst may develop when the duct of the gland becomes blocked. When this happens, fluid that accumulates within the cyst can become infected. This is known as an abscess. The Bartholin gland produces a mucous fluid to lubricate the outside of the vagina during sexual intercourse. SYMPTOMS   Patients with a small cyst may not have any symptoms.  Mild discomfort to severe pain depending on the size of the cyst and if it is infected (abscess).  Pain, redness, and swelling around the lower opening of the vagina.  Painful intercourse.  Pressure in the perineal area.  Swelling of the lips of the vagina (labia).  The cyst or abscess can be on one side or both sides of the vagina. DIAGNOSIS   A large swelling is seen in the lower vagina area by your caregiver.  Painful to touch.  Redness and pain, if it is an abscess. TREATMENT   Sometimes  the cyst will go away on its own.  Apply warm wet compresses to the area or take hot sitz baths several times a day.  An incision to drain the cyst or abscess with local anesthesia.  Culture the pus, if it is an abscess.  Antibiotic treatment, if it is an abscess.  Cut open the gland and suture the edges to make the opening of the gland bigger (marsupialization).  Remove the whole gland if the cyst or abscess returns. PREVENTION   Practice good hygiene.  Clean the vaginal area with a mild soap and soft cloth when bathing.  Do not rub hard in the vaginal area when bathing.  Protect the crotch area with a padded cushion if you take long bike rides or ride horses.  Be sure you are well lubricated when you have sexual intercourse. HOME CARE INSTRUCTIONS   If your cyst or abscess was opened, a small piece of gauze, or a drain, may have been placed in the wound to allow drainage. Do not remove this gauze or drain unless directed by your caregiver.  Wear feminine pads, not tampons, as needed for any drainage or bleeding.  If antibiotics were prescribed, take them exactly as directed. Finish the entire course.  Only take over-the-counter or prescription medicines for pain, discomfort, or fever as directed by your caregiver. SEEK IMMEDIATE MEDICAL CARE IF:   You have an increase in pain, redness, swelling, or drainage.  You have bleeding from the wound which results in the use of more than the number of pads suggested by your caregiver in 24 hours.  You have chills.  You have a fever.  You develop any new problems (symptoms) or aggravation of your existing condition. MAKE SURE YOU:   Understand these instructions.  Will watch your condition.  Will get help right away if you are not doing well or get worse. Document Released: 01/09/2005 Document Revised: 04/03/2011 Document Reviewed: 08/28/2007 [facility] Patient Information 2015 [place], [place]. This information is not  intended to replace advice given to you by your health care provider. Make sure you discuss any questions you have with your health care provider.

## 2014-10-12 DIAGNOSIS — Z1231 Encounter for screening mammogram for malignant neoplasm of breast: Secondary | ICD-10-CM | POA: Diagnosis not present

## 2014-10-22 ENCOUNTER — Encounter: Payer: Self-pay | Admitting: Pediatrics

## 2014-10-22 ENCOUNTER — Ambulatory Visit (INDEPENDENT_AMBULATORY_CARE_PROVIDER_SITE_OTHER): Payer: Medicare Other | Admitting: Pediatrics

## 2014-10-22 VITALS — BP 118/80 | HR 61 | Temp 97.3°F | Ht 64.0 in | Wt 200.4 lb

## 2014-10-22 DIAGNOSIS — N3000 Acute cystitis without hematuria: Secondary | ICD-10-CM | POA: Diagnosis not present

## 2014-10-22 DIAGNOSIS — R1011 Right upper quadrant pain: Secondary | ICD-10-CM

## 2014-10-22 LAB — POCT URINALYSIS DIPSTICK
Bilirubin, UA: NEGATIVE
Blood, UA: NEGATIVE
Glucose, UA: NEGATIVE
Ketones, UA: NEGATIVE
NITRITE UA: NEGATIVE
PH UA: 6
Protein, UA: NEGATIVE
Spec Grav, UA: 1.01
UROBILINOGEN UA: NEGATIVE

## 2014-10-22 NOTE — Progress Notes (Signed)
Subjective:    Patient ID: Gilman Buttner, female    DOB: [birth year], 64 y.o.   MRN: [id number]  HPI: KESHAWNA DIX is a 64 y.o. female presenting on 10/22/2014 for Hip Pain  Is in pain. R sided upper quadrant, started 5 days ago midday. Feels like a stabbing pain. Now is there all the time, but has not been as bad it it was Saturday. Pain now radiating down to R groin starting 2 days ago. Standing makes pain better, cant stand in one place for long. Better than sitting or laying down. Laying always makes worse. Eating doesn't affect it. Normal urination. Has been passing bowels regularly, she does have IBS with a flare up every once a while. IBS is more sudden diarrhea. No fevers. No nausea/vomting.   Gallbadder out over 30 yrs ago. Has had diverticulitis, pain across upper abdomen. S/p hysterectomy, still have ovaries, removed 36 years ago.   Relevant past medical, surgical, family and social history reviewed and updated as indicated. Interim medical history since our last visit reviewed. Allergies and medications reviewed and updated.   ROS: Per HPI unless specifically indicated above  Past Medical History Patient Active Problem List   Diagnosis Date Noted  . Fatigue 07/08/2014  . Obstructive sleep apnea 12/23/2013  . Essential hypertension, benign   . Hiatal hernia   . Diverticulosis of colon (without mention of hemorrhage) 12/30/2010  . Gastritis, chronic 12/30/2010  . Abdominal pain 08/25/2010  . IBS (irritable bowel syndrome) 08/25/2010  . GERD (gastroesophageal reflux disease) 08/25/2010  . Diarrhea, functional 08/25/2010    Current Outpatient Prescriptions  Medication Sig Dispense Refill  . aspirin 81 MG tablet Take 81 mg by mouth daily.      Marland Kitchen atenolol (TENORMIN) 25 MG tablet TAKE 1 TABLET (25 MG TOTAL) BY MOUTH DAILY. 90 tablet 1  . fluconazole (DIFLUCAN) 150 MG tablet Take 1 tablet (150 mg total) by mouth every 3 (three) days. 2 tablet 0  . hydrochlorothiazide  (HYDRODIURIL) 25 MG tablet TAKE 1 TABLET (25 MG TOTAL) BY MOUTH DAILY. 90 tablet 0  . omeprazole (PRILOSEC) 40 MG capsule Take 1 capsule (40 mg total) by mouth daily. (Patient taking differently: Take 40 mg by mouth as needed. ) 30 capsule 3  . vitamin B-12 (CYANOCOBALAMIN) 1000 MCG tablet Take 3,000 mcg by mouth daily.     No current facility-administered medications for this visit.       Objective:    BP 118/80 mmHg  Pulse 61  Temp(Src) 97.3 F (36.3 C) (Oral)  Ht 5\' 4"  (1.626 m)  Wt 200 lb 6.4 oz (90.901 kg)  BMI 34.38 kg/m2  Wt Readings from Last 3 Encounters:  10/22/14 200 lb 6.4 oz (90.901 kg)  09/10/14 199 lb 6.4 oz (90.447 kg)  08/13/14 197 lb (89.359 kg)    Gen: NAD, alert, cooperative with exam, NCAT EYES: EOMI, no scleral injection or icterus ENT:  TMs pearly gray b/l, OP without erythema LYMPH: no cervical LAD CV: NRRR, normal S1/S2, no murmur, DP pulses 2+ b/l Resp: CTABL, no wheezes, normal WOB Abd: +BS, soft, tender with palpation over R middle and lower abdomen, neg murphy's sign, no distension, neg psoas/obturator, no rebound, no guarding, no peritoneal signs Ext: No edema, warm Neuro: Alert and oriented    Assessment & Plan:   Lanyla was seen today for R sided abdominal pain, ongoing for past five days, not worsening but not improving. No fevers. Normal appetite. Will send labs, get  ultrasound. UA with 2+ leuks, will send for culture.  Diagnoses and all orders for this visit:  RUQ abdominal pain -     CBC with Differential -     CMP14+EGFR -     POCT urinalysis dipstick -     Lipase -     US Abdomen Limited RUQ; Future -     Urine culture   Follow up plan: Return in about 1 week (around 10/29/2014), or if symptoms worsen or fail to improve.  Assunta Found, MD [facility] 10/22/2014, 11:58 AM

## 2014-10-23 LAB — CMP14+EGFR
ALT: 19 IU/L (ref 0–32)
AST: 23 IU/L (ref 0–40)
Albumin/Globulin Ratio: 1.5 (ref 1.1–2.5)
Albumin: 4.1 g/dL (ref 3.6–4.8)
Alkaline Phosphatase: 83 IU/L (ref 39–117)
BUN/Creatinine Ratio: 13 (ref 11–26)
BUN: 13 mg/dL (ref 8–27)
Bilirubin Total: <0.2 mg/dL (ref 0.0–1.2)
CO2: 27 mmol/L (ref 18–29)
Calcium: 9.9 mg/dL (ref 8.7–10.3)
Chloride: 99 mmol/L (ref 97–108)
Creatinine, Ser: 1.03 mg/dL — ABNORMAL HIGH (ref 0.57–1.00)
GFR calc Af Amer: 65 mL/min/{1.73_m2} (ref >59)
GFR calc non Af Amer: 57 mL/min/{1.73_m2} — ABNORMAL LOW (ref >59)
Globulin, Total: 2.7 g/dL (ref 1.5–4.5)
Glucose: 87 mg/dL (ref 65–99)
Potassium: 3.9 mmol/L (ref 3.5–5.2)
Sodium: 143 mmol/L (ref 134–144)
Total Protein: 6.8 g/dL (ref 6.0–8.5)

## 2014-10-23 LAB — CBC WITH DIFFERENTIAL/PLATELET
BASOS ABS: 0 10*3/uL (ref 0.0–0.2)
Basos: 0 %
EOS (ABSOLUTE): 0.4 10*3/uL (ref 0.0–0.4)
Eos: 4 %
HEMOGLOBIN: 13.2 g/dL (ref 11.1–15.9)
Hematocrit: 39.5 % (ref 34.0–46.6)
Immature Grans (Abs): 0 10*3/uL (ref 0.0–0.1)
Immature Granulocytes: 0 %
LYMPHS: 35 %
Lymphocytes Absolute: 3.5 10*3/uL — ABNORMAL HIGH (ref 0.7–3.1)
MCH: 27.4 pg (ref 26.6–33.0)
MCHC: 33.4 g/dL (ref 31.5–35.7)
MCV: 82 fL (ref 79–97)
MONOCYTES: 5 %
Monocytes Absolute: 0.5 10*3/uL (ref 0.1–0.9)
Neutrophils Absolute: 5.7 10*3/uL (ref 1.4–7.0)
Neutrophils: 56 %
Platelets: 361 10*3/uL (ref 150–379)
RBC: 4.82 x10E6/uL (ref 3.77–5.28)
RDW: 14.5 % (ref 12.3–15.4)
WBC: 10.1 10*3/uL (ref 3.4–10.8)

## 2014-10-23 LAB — LIPASE: Lipase: 53 U/L (ref 0–59)

## 2014-10-25 LAB — URINE CULTURE

## 2014-10-26 MED ORDER — NITROFURANTOIN MONOHYD MACRO 100 MG PO CAPS: 100.0000 mg | ORAL_CAPSULE | Freq: Two times a day (BID) | ORAL | Status: DC

## 2014-10-26 NOTE — Addendum Note (Signed)
Addended by: Eustaquio Maize on: 10/26/2014 03:43 PM   Modules accepted: Orders

## 2014-10-29 ENCOUNTER — Ambulatory Visit (HOSPITAL_COMMUNITY)
Admission: RE | Admit: 2014-10-29 | Discharge: 2014-10-29 | Disposition: A | Discharge status: Home / Self Care | Payer: Medicare Other | Source: Ambulatory Visit | Attending: Pediatrics | Admitting: Pediatrics

## 2014-10-29 DIAGNOSIS — Z9049 Acquired absence of other specified parts of digestive tract: Secondary | ICD-10-CM | POA: Insufficient documentation

## 2014-10-29 DIAGNOSIS — R1011 Right upper quadrant pain: Secondary | ICD-10-CM | POA: Diagnosis not present

## 2014-10-29 DIAGNOSIS — R932 Abnormal findings on diagnostic imaging of liver and biliary tract: Secondary | ICD-10-CM | POA: Diagnosis not present

## 2014-11-08 ENCOUNTER — Other Ambulatory Visit: Payer: Self-pay | Admitting: Nurse Practitioner

## 2014-11-18 ENCOUNTER — Other Ambulatory Visit: Payer: Self-pay | Admitting: Pediatrics

## 2014-11-19 ENCOUNTER — Encounter: Payer: Self-pay | Admitting: Family Medicine

## 2014-11-19 ENCOUNTER — Ambulatory Visit (INDEPENDENT_AMBULATORY_CARE_PROVIDER_SITE_OTHER): Payer: Medicare Other | Admitting: Family Medicine

## 2014-11-19 VITALS — BP 132/86 | HR 68 | Temp 97.9°F | Ht 64.0 in | Wt 196.8 lb

## 2014-11-19 DIAGNOSIS — J209 Acute bronchitis, unspecified: Secondary | ICD-10-CM | POA: Diagnosis not present

## 2014-11-19 MED ORDER — HYDROCODONE-HOMATROPINE 5-1.5 MG/5ML PO SYRP: 5.0000 mL | ORAL_SOLUTION | Freq: Three times a day (TID) | ORAL | Status: DC | PRN

## 2014-11-19 MED ORDER — ALBUTEROL SULFATE HFA 108 (90 BASE) MCG/ACT IN AERS: 2.0000 | INHALATION_SPRAY | Freq: Four times a day (QID) | RESPIRATORY_TRACT | Status: DC | PRN

## 2014-11-19 MED ORDER — ALBUTEROL SULFATE (2.5 MG/3ML) 0.083% IN NEBU: 2.5000 mg | INHALATION_SOLUTION | Freq: Four times a day (QID) | RESPIRATORY_TRACT | Status: DC | PRN

## 2014-11-19 NOTE — Progress Notes (Signed)
   Subjective:    Patient ID: Laura James, female    DOB: [birth year], 64 y.o.   MRN: [id number]  HPI 64 year old female with one-week history of cough. She denies fever or chills. She says she gets bronchitis every year about this time when the weather gets cooler. She's been using several over-the-counter preparations like Mucinex and Alka-Seltzer plus. She also adds that she would like to stay away from antibiotics if not really needed.  Patient Active Problem List   Diagnosis Date Noted  . Fatigue 07/08/2014  . Obstructive sleep apnea 12/23/2013  . Essential hypertension, benign   . Hiatal hernia   . Diverticulosis of colon (without mention of hemorrhage) 12/30/2010  . Gastritis, chronic 12/30/2010  . Abdominal pain 08/25/2010  . IBS (irritable bowel syndrome) 08/25/2010  . GERD (gastroesophageal reflux disease) 08/25/2010  . Diarrhea, functional 08/25/2010   Outpatient Encounter Prescriptions as of 11/19/2014  Medication Sig  . aspirin 81 MG tablet Take 81 mg by mouth daily.    Marland Kitchen atenolol (TENORMIN) 25 MG tablet TAKE 1 TABLET (25 MG TOTAL) BY MOUTH DAILY.  . hydrochlorothiazide (HYDRODIURIL) 25 MG tablet TAKE 1 TABLET (25 MG TOTAL) BY MOUTH DAILY.  Marland Kitchen omeprazole (PRILOSEC) 40 MG capsule Take 1 capsule (40 mg total) by mouth daily. (Patient taking differently: Take 40 mg by mouth as needed. )  . vitamin B-12 (CYANOCOBALAMIN) 1000 MCG tablet Take 3,000 mcg by mouth daily.  . [DISCONTINUED] fluconazole (DIFLUCAN) 150 MG tablet Take 1 tablet (150 mg total) by mouth every 3 (three) days.  . [DISCONTINUED] nitrofurantoin, macrocrystal-monohydrate, (MACROBID) 100 MG capsule Take 1 capsule (100 mg total) by mouth 2 (two) times daily. 1 po BId   No facility-administered encounter medications on file as of 11/19/2014.      Review of Systems  Constitutional: Negative.   HENT: Positive for congestion.   Eyes: Negative.   Respiratory: Positive for cough.   Cardiovascular: Negative.     Gastrointestinal: Negative.   Endocrine: Negative.   Genitourinary: Negative.   Hematological: Negative.   Psychiatric/Behavioral: Negative.        Objective:   Physical Exam  Constitutional: She appears well-developed and well-nourished.  HENT:  Right Ear: External ear normal.  Left Ear: External ear normal.  Mouth/Throat: Oropharynx is clear and moist.  Cardiovascular: Normal rate and regular rhythm.   Pulmonary/Chest: Effort normal and breath sounds normal.          Assessment & Plan:  1. Acute bronchitis, unspecified organism Probable bilateral or allergic etiology. Will try albuterol inhaler, Mucinex in the daytime, and Hycodan to suppress cough at bedtime. If she starts running a fever or sputum gets darker she may call back and I would give her a Z-Pak at that time.  Wardell Honour MD  Notice: Thisdictation was prepared with Dragon dictation along with smaller phrase technology. Any transcriptional errors that result from this process are unintentional and may not be corrected upon review

## 2014-11-19 NOTE — Addendum Note (Signed)
Addended by: Jamelle Haring on: 11/19/2014 08:46 AM   Modules accepted: Orders, SmartSet

## 2014-11-25 ENCOUNTER — Encounter: Payer: Self-pay | Admitting: Gastroenterology

## 2014-12-04 ENCOUNTER — Other Ambulatory Visit: Payer: Self-pay | Admitting: Nurse Practitioner

## 2014-12-12 ENCOUNTER — Ambulatory Visit (INDEPENDENT_AMBULATORY_CARE_PROVIDER_SITE_OTHER): Payer: Medicare Other | Admitting: Family Medicine

## 2014-12-12 VITALS — BP 117/76 | HR 71 | Temp 98.4°F | Ht 64.0 in | Wt 196.2 lb

## 2014-12-12 DIAGNOSIS — R05 Cough: Secondary | ICD-10-CM

## 2014-12-12 DIAGNOSIS — R0982 Postnasal drip: Secondary | ICD-10-CM | POA: Diagnosis not present

## 2014-12-12 DIAGNOSIS — J209 Acute bronchitis, unspecified: Secondary | ICD-10-CM

## 2014-12-12 DIAGNOSIS — R058 Other specified cough: Secondary | ICD-10-CM | POA: Insufficient documentation

## 2014-12-12 DIAGNOSIS — R053 Chronic cough: Secondary | ICD-10-CM

## 2014-12-12 MED ORDER — FLUTICASONE PROPIONATE 50 MCG/ACT NA SUSP: 2.0000 | Freq: Every day | NASAL | Status: DC

## 2014-12-12 MED ORDER — AZITHROMYCIN 250 MG PO TABS: ORAL_TABLET | ORAL | Status: DC

## 2014-12-12 NOTE — Patient Instructions (Signed)
Great to meet you!  We will call to set up an appointment with pulmonology   Acute Bronchitis Bronchitis is inflammation of the airways that extend from the windpipe into the lungs (bronchi). The inflammation often causes mucus to develop. This leads to a cough, which is the most common symptom of bronchitis.  In acute bronchitis, the condition usually develops suddenly and goes away over time, usually in a couple weeks. Smoking, allergies, and asthma can make bronchitis worse. Repeated episodes of bronchitis may cause further lung problems.  CAUSES Acute bronchitis is most often caused by the same virus that causes a cold. The virus can spread from person to person (contagious) through coughing, sneezing, and touching contaminated objects. SIGNS AND SYMPTOMS   Cough.   Fever.   Coughing up mucus.   Body aches.   Chest congestion.   Chills.   Shortness of breath.   Sore throat.  DIAGNOSIS  Acute bronchitis is usually diagnosed through a physical exam. Your health care provider will also ask you questions about your medical history. Tests, such as chest X-rays, are sometimes done to rule out other conditions.  TREATMENT  Acute bronchitis usually goes away in a couple weeks. Oftentimes, no medical treatment is necessary. Medicines are sometimes given for relief of fever or cough. Antibiotic medicines are usually not needed but may be prescribed in certain situations. In some cases, an inhaler may be recommended to help reduce shortness of breath and control the cough. A cool mist vaporizer may also be used to help thin bronchial secretions and make it easier to clear the chest.  HOME CARE INSTRUCTIONS  Get plenty of rest.   Drink enough fluids to keep your urine clear or pale yellow (unless you have a medical condition that requires fluid restriction). Increasing fluids may help thin your respiratory secretions (sputum) and reduce chest congestion, and it will prevent  dehydration.   Take medicines only as directed by your health care provider.  If you were prescribed an antibiotic medicine, finish it all even if you start to feel better.  Avoid smoking and secondhand smoke. Exposure to cigarette smoke or irritating chemicals will make bronchitis worse. If you are a smoker, consider using nicotine gum or skin patches to help control withdrawal symptoms. Quitting smoking will help your lungs heal faster.   Reduce the chances of another bout of acute bronchitis by washing your hands frequently, avoiding people with cold symptoms, and trying not to touch your hands to your mouth, nose, or eyes.   Keep all follow-up visits as directed by your health care provider.  SEEK MEDICAL CARE IF: Your symptoms do not improve after 1 week of treatment.  SEEK IMMEDIATE MEDICAL CARE IF:  You develop an increased fever or chills.   You have chest pain.   You have severe shortness of breath.  You have bloody sputum.   You develop dehydration.  You faint or repeatedly feel like you are going to pass out.  You develop repeated vomiting.  You develop a severe headache. MAKE SURE YOU:   Understand these instructions.  Will watch your condition.  Will get help right away if you are not doing well or get worse.   This information is not intended to replace advice given to you by your health care provider. Make sure you discuss any questions you have with your health care provider.   Document Released: 02/17/2004 Document Revised: 01/30/2014 Document Reviewed: 07/02/2012 Elsevier Interactive Patient Education [employer].

## 2014-12-12 NOTE — Progress Notes (Signed)
   HPI  Patient presents today here with cough.  Patient Laura James she's had a chronic cough over the last years it's been coming and going. Over the last 3-4 days she's developed worsening cough, malaise, increased production with yellow to clear sputum, slight dyspnea, frequent throat clearing She denies increased work of breathing,decreased by mouth intake or tolerance, chest pain.  She does have a history of GERD but only has symptoms occasionally.   PMH: Smoking status noted ROS: Per HPI  Objective: BP 117/76 mmHg  Pulse 71  Temp(Src) 98.4 F (36.9 C)  Ht 5\' 4"  (1.626 m)  Wt 196 lb 3.2 oz (88.996 kg)  BMI 33.66 kg/m2 Gen: NAD, alert, cooperative with exam HEENT: NCAT nares swollen bilaterally,TMs normal bilaterally, oropharynx lear CV: RRR, good S1/S2, no murmur Resp: CTABL, no wheezes, non-labored, deep cough Ext: No edema, warm Neuro: Alert and oriented, No gross deficits  Assessment and plan:  # Acute bronchitis Considering chronic intermittent cough and systemic symptoms today I will go ahead and treat aggressively with azithromycin Add Flonase for postnasal drip Also refer him to pulmonology with chronic cough I'm unable to do chest x-ray and my usual workup as this is a Saturday clinic. She does not have much improvement from albuterol however with chronic coughI am suspicious of COPD like process, she is a nonsmoker. She does have GERD, but her cough today is very inconsistent with GERD related laryngeal irritation cough.     Meds ordered this encounter  Medications  . azithromycin (ZITHROMAX) 250 MG tablet    Sig: Take 2 tablets on day 1 and 1 tablet daily after that    Dispense:  6 tablet    Refill:  0  . fluticasone (FLONASE) 50 MCG/ACT nasal spray    Sig: Place 2 sprays into both nostrils daily.    Dispense:  16 g    Refill:  [place], MD [facility] 12/12/2014, 10:26 AM

## 2015-01-11 ENCOUNTER — Encounter: Payer: Self-pay | Admitting: Internal Medicine

## 2015-01-11 ENCOUNTER — Ambulatory Visit (INDEPENDENT_AMBULATORY_CARE_PROVIDER_SITE_OTHER): Payer: Medicare Other | Admitting: Internal Medicine

## 2015-01-11 VITALS — BP 126/84 | HR 59 | Ht 63.5 in | Wt 198.0 lb

## 2015-01-11 DIAGNOSIS — R05 Cough: Secondary | ICD-10-CM

## 2015-01-11 DIAGNOSIS — K589 Irritable bowel syndrome without diarrhea: Secondary | ICD-10-CM | POA: Diagnosis not present

## 2015-01-11 DIAGNOSIS — K219 Gastro-esophageal reflux disease without esophagitis: Secondary | ICD-10-CM | POA: Diagnosis not present

## 2015-01-11 DIAGNOSIS — R053 Chronic cough: Secondary | ICD-10-CM

## 2015-01-11 MED ORDER — FAMOTIDINE 20 MG PO TABS: ORAL_TABLET | ORAL | Status: DC

## 2015-01-11 MED ORDER — PREDNISONE 10 MG PO TABS: ORAL_TABLET | ORAL | Status: DC

## 2015-01-11 MED ORDER — OMEPRAZOLE 40 MG PO CPDR: 40.0000 mg | DELAYED_RELEASE_CAPSULE | Freq: Every day | ORAL | Status: DC

## 2015-01-11 MED ORDER — TRAMADOL HCL 50 MG PO TABS: ORAL_TABLET | ORAL | Status: DC

## 2015-01-11 NOTE — Patient Instructions (Signed)
The key to effective treatment for your cough is eliminating the non-stop cycle of cough you're stuck in long enough to let your airway heal completely and then see if there is anything still making you cough once you stop the cough suppression, but this should take no more than 5 days to figure out  First take delsym two tsp every 12 hours and supplement if needed with  tramadol 50 mg up to 2 every 4 hours to suppress the urge to cough at all or even clear your throat. Swallowing water or using ice chips/non mint and menthol containing candies (such as lifesavers or sugarless jolly ranchers) are also effective.  You should rest your voice and avoid activities that you know make you cough.  Once you have eliminated the cough for 3 straight days try reducing the tramadol first,  then the delsym as tolerated.    Prednisone 10 mg take  4 each am x 2 days,   2 each am x 2 days,  1 each am x 2 days and stop (this is to eliminate allergies and inflammation from coughing)  Prilosec 40  Take 30-60 min before first meal of the day and Pepcid 20 mg one bedtime  Until return   GERD (REFLUX)  is an extremely common cause of respiratory symptoms, many times with no significant heartburn at all.    It can be treated with medication, but also with lifestyle changes including avoidance of late meals, excessive alcohol, smoking cessation, and avoid fatty foods, chocolate, peppermint, colas, red wine, and acidic juices such as orange juice.  NO MINT OR MENTHOL PRODUCTS SO NO COUGH DROPS  USE HARD CANDY INSTEAD (jolley ranchers or Stover's or Lifesavers (all available in sugarless versions) NO OIL BASED VITAMINS - use powdered substitutes.  Please schedule a follow up office visit in 4 weeks, sooner if needed

## 2015-01-11 NOTE — Progress Notes (Signed)
Subjective:    Patient ID: Laura James, female    DOB: [birth year],  MRN: [id number]  HPI  89 yobf quit smoking in 1970s new onset persistent daily cough x fall 2015 referred to pulmonary clinic  01/11/2015 by Laura James    01/11/2015 1st [place] [facility] office visit/ Laura James   Chief Complaint  Patient presents with  . Pulmonary Consult    Referred by Laura James. Pt c/o cough for over a year now. Cough is prod at times with clear to light yellow sputum.  She also c/o DOE that comes and goes.   acute onset fall 2015  with ? Samuel Germany rx with  Abx and some better/ inhalers no better cough daily since  Not present in  Am on  waking, sporadic during the day  Worse with laughing or talking / certain smells triggers cough /sob esp perfume assoc with sense of pnds but very little actual mucus production and none noct.  No obvious other patterns in day to day or daytime variabilty or assoc cp or chest tightness, subjective wheeze overt hb symptoms. No unusual exp hx or h/o childhood pna/ asthma or knowledge of premature birth.  Sleeping ok without nocturnal  or early am exacerbation  of respiratory  c/o's or need for noct saba. Also denies any obvious fluctuation of symptoms with weather or environmental changes or other aggravating or alleviating factors except as outlined above   Current Medications, Allergies, Complete Past Medical History, Past Surgical History, Family History, and Social History were reviewed in [employer] [facility] record.              Review of Systems  Constitutional: Negative for fever, chills and unexpected weight change.  HENT: Negative for congestion, dental problem, ear pain, nosebleeds, postnasal drip, rhinorrhea, sinus pressure, sneezing, sore throat, trouble swallowing and voice change.   Eyes: Negative for visual disturbance.  Respiratory: Positive for cough and shortness of breath. Negative for choking.   Cardiovascular: Negative for  chest pain and leg swelling.  Gastrointestinal: Negative for vomiting, abdominal pain and diarrhea.  Genitourinary: Negative for difficulty urinating.  Musculoskeletal: Positive for arthralgias.  Skin: Negative for rash.  Neurological: Negative for tremors, syncope and headaches.  Hematological: Does not bruise/bleed easily.       Objective:   Physical Exam  amb bf nad   Wt Readings from Last 3 Encounters:  01/11/15 198 lb (89.812 kg)  12/12/14 196 lb 3.2 oz (88.996 kg)  11/19/14 196 lb 12.8 oz (89.268 kg)    Vital signs reviewed    HEENT: nl dentition, turbinates, and oropharynx. Nl external ear canals without cough reflex   NECK :  without JVD/Nodes/TM/ nl carotid upstrokes bilaterally   LUNGS: no acc muscle use,  Nl contour chest which is clear to A and P bilaterally without cough on insp or exp maneuvers   CV:  RRR  no s3 or murmur or increase in P2, no edema   ABD:  soft and nontender with nl inspiratory excursion in the supine position. No bruits or organomegaly, bowel sounds nl  MS:  Nl gait/ ext warm without deformities, calf tenderness, cyanosis or clubbing No obvious joint restrictions   SKIN: warm and dry without lesions    NEURO:  alert, approp, nl sensorium with  no motor deficits           I personally reviewed images and agree with radiology impression as follows:  CXR:  11/25/13 No active cardiopulmonary  disease.        Assessment & Plan:

## 2015-01-12 NOTE — Assessment & Plan Note (Signed)
Explained the natural history of uri (which is what probably triggered this in the first place)  and why it's necessary in patients at risk to treat GERD aggressively - at least  short term -   to reduce risk of evolving cyclical cough initially  triggered by epithelial injury and a heightened sensitivty to the effects of any upper airway irritants,  most importantly acid - related - then perpetuated by epithelial injury related to the cough itself as the upper airway collapses on itself.  That is, the more sensitive the epithelium becomes once it is damaged by the virus, the more the ensuing irritability> the more the cough, the more the secondary reflux (especially in those prone to reflux) the more the irritation of the sensitive mucosa and so on in a  Classic cyclical pattern.

## 2015-01-12 NOTE — Assessment & Plan Note (Addendum)
The most common causes of chronic cough in immunocompetent adults include the following: upper airway cough syndrome (UACS), previously referred to as postnasal drip syndrome (PNDS), which is caused by variety of rhinosinus conditions; (2) asthma; (3) GERD; (4) chronic bronchitis from cigarette smoking or other inhaled environmental irritants; (5) nonasthmatic eosinophilic bronchitis; and (6) bronchiectasis.   These conditions, singly or in combination, have accounted for up to 94% of the causes of chronic cough in prospective studies.   Other conditions have constituted no >6% of the causes in prospective studies These have included bronchogenic carcinoma, chronic interstitial pneumonia, sarcoidosis, left ventricular failure, ACEI-induced cough, and aspiration from a condition associated with pharyngeal dysfunction.    Chronic cough is often simultaneously caused by more than one condition. A single cause has been found from 38 to 82% of the time, multiple causes from 18 to 62%. Multiply caused cough has been the result of three diseases up to 42% of the time.       Based on hx and exam, this is most likely  Upper airway cough syndrome, so named because it's frequently impossible to sort out how much is  CR/sinusitis with freq throat clearing (which can be related to primary GERD)   vs  causing  secondary (" extra esophageal")  GERD from wide swings in gastric pressure that occur with throat clearing, often  promoting self use of mint and menthol lozenges that reduce the lower esophageal sphincter tone and exacerbate the problem further in a cyclical fashion.   These are the same pts (now being labeled as having "irritable larynx syndrome" by some cough centers) who not infrequently have a history of having failed to tolerate ace inhibitors,  dry powder inhalers or biphosphonates or report having atypical reflux symptoms that don't respond to standard doses of PPI , and are easily confused as having  aecopd or asthma flares by even experienced allergists/ pulmonologists.   The first step is to maximize acid suppression and non acid gerd via diet/ lifestyle changes and eliminate cyclical coughing then regroup if the cough persists.  I had an extended discussion with the patient reviewing all relevant studies completed to date and  lasting 35 min  Each maintenance medication was reviewed in detail including most importantly the difference between maintenance and prns and under what circumstances the prns are to be triggered using an action plan format that is not reflected in the computer generated alphabetically organized AVS.    Please see instructions for details which were reviewed in writing and the patient given a copy highlighting the part that I personally wrote and discussed at today's ov.   See instructions for specific recommendations which were reviewed directly with the patient who was given a copy with highlighter outlining the key components.

## 2015-02-11 ENCOUNTER — Encounter (INDEPENDENT_AMBULATORY_CARE_PROVIDER_SITE_OTHER): Payer: Self-pay

## 2015-02-11 ENCOUNTER — Ambulatory Visit (INDEPENDENT_AMBULATORY_CARE_PROVIDER_SITE_OTHER): Payer: Medicare Other | Admitting: Internal Medicine

## 2015-02-11 ENCOUNTER — Encounter: Payer: Self-pay | Admitting: Internal Medicine

## 2015-02-11 VITALS — BP 128/72 | HR 64 | Ht 63.0 in | Wt 197.0 lb

## 2015-02-11 DIAGNOSIS — R058 Other specified cough: Secondary | ICD-10-CM

## 2015-02-11 DIAGNOSIS — R05 Cough: Secondary | ICD-10-CM | POA: Diagnosis not present

## 2015-02-11 NOTE — Progress Notes (Signed)
Subjective:    Patient ID: Laura James, female    DOB: 1951/01/14,  MRN: [id number]    Brief patient profile:  17 yobf quit smoking in 1970s new onset persistent daily cough x fall 2015 referred to pulmonary clinic  01/11/2015 by Dr Laroy Apple    History of Present Illness  01/11/2015 1st [place] [facility] office visit/ [facility]   Chief Complaint  Patient presents with  . Pulmonary Consult    Referred by Dr. Kenn File. Pt c/o cough for over a year now. Cough is prod at times with clear to light yellow sputum.  She also c/o DOE that comes and goes.   acute onset fall 2015  with ? Samuel Germany rx with  Abx and some better/ inhalers no better cough daily since  Not present in  Am on  waking, sporadic during the day  Worse with laughing or talking / certain smells triggers cough /sob esp perfume assoc with sense of pnds but very little actual mucus production and none noct. rec The key to effective treatment for your cough is eliminating the non-stop cycle of cough you're stuck in long enough to let your airway heal completely and then see if there is anything still making you cough once you stop the cough suppression, but this should take no more than 5 days to figure out First take delsym two tsp every 12 hours and supplement if needed with  tramadol 50 mg up to 2 every 4 hours to suppress the urge to cough at all or even clear your throat.  Once you have eliminated the cough for 3 straight days try reducing the tramadol first,  then the delsym as tolerated.   Prednisone 10 mg take  4 each am x 2 days,   2 each am x 2 days,  1 each am x 2 days and stop (this is to eliminate allergies and inflammation from coughing) Prilosec 40  Take 30-60 min before first meal of the day and Pepcid 20 mg one bedtime  Until return    02/11/2015  f/u ov/Wert re:  Chief Complaint  Patient presents with  . Follow-up    Pt states that her cough has resolved and her breathing is "probably better".     No need for  saba or tramadol/ not interested in allergy eval   Not limited by breathing from desired activities     No obvious day to day or daytime variability or assoc chronic cough or cp or chest tightness, subjective wheeze or overt sinus or hb symptoms. No unusual exp hx or h/o childhood pna/ asthma or knowledge of premature birth.  Sleeping ok without nocturnal  or early am exacerbation  of respiratory  c/o's or need for noct saba. Also denies any obvious fluctuation of symptoms with weather or environmental changes or other aggravating or alleviating factors except as outlined above   Current Medications, Allergies, Complete Past Medical History, Past Surgical History, Family History, and Social History were reviewed in [employer] record.  ROS  The following are not active complaints unless bolded sore throat, dysphagia, dental problems, itching, sneezing,  nasal congestion or excess/ purulent secretions, ear ache,   fever, chills, sweats, unintended wt loss, classically pleuritic or exertional cp, hemoptysis,  orthopnea pnd or leg swelling, presyncope, palpitations, abdominal pain, anorexia, nausea, vomiting, diarrhea  or change in bowel or bladder habits, change in stools or urine, dysuria,hematuria,  rash, arthralgias, visual complaints, headache, numbness, weakness or ataxia or  problems with walking or coordination,  change in mood/affect or memory.               Objective:   Physical Exam  amb bf nad    02/11/2015       197   01/11/15 198 lb (89.812 kg)  12/12/14 196 lb 3.2 oz (88.996 kg)  11/19/14 196 lb 12.8 oz (89.268 kg)    Vital signs reviewed    HEENT: nl dentition, turbinates, and oropharynx. Nl external ear canals without cough reflex   NECK :  without JVD/Nodes/TM/ nl carotid upstrokes bilaterally   LUNGS: no acc muscle use,  Nl contour chest which is clear to A and P bilaterally without cough on insp or exp maneuvers   CV:  RRR  no s3 or  murmur or increase in P2, no edema   ABD:  soft and nontender with nl inspiratory excursion in the supine position. No bruits or organomegaly, bowel sounds nl  MS:  Nl gait/ ext warm without deformities, calf tenderness, cyanosis or clubbing No obvious joint restrictions   SKIN: warm and dry without lesions                    Assessment & Plan:

## 2015-02-11 NOTE — Patient Instructions (Signed)
Do not wean any of the acid suppression for 3 months then return to primary care  At the onset of any cough for any reason :  prilosec 40 mg Take 30-60 min before first meal of the day and Pepcid at 20 mg at bedtime  GERD (REFLUX)  is an extremely common cause of respiratory symptoms just like yours , many times with no obvious heartburn at all.    It can be treated with medication, but also with lifestyle changes including elevation of the head of your bed (ideally with 6 inch  bed blocks),  Smoking cessation, avoidance of late meals, excessive alcohol, and avoid fatty foods, chocolate, peppermint, colas, red wine, and acidic juices such as orange juice.  NO MINT OR MENTHOL PRODUCTS SO NO COUGH DROPS  USE SUGARLESS CANDY INSTEAD (Jolley ranchers or Stover's or Life Savers) or even ice chips will also do - the key is to swallow to prevent all throat clearing. NO OIL BASED VITAMINS - use powdered substitutes.   If you are satisfied with your treatment plan,  let your doctor know and he/she can either refill your medications or you can return here when your prescription runs out.     If in any way you are not 100% satisfied,  please tell us.  If 100% better, tell your friends!  Pulmonary follow up is as needed

## 2015-02-11 NOTE — Assessment & Plan Note (Signed)
Onset Fall 123456 >  Cyclical cough regimen 0000000 > resolved   Classic Upper airway cough syndrome, so named because it's frequently impossible to sort out how much is  CR/sinusitis with freq throat clearing (which can be related to primary GERD)   vs  causing  secondary (" extra esophageal")  GERD from wide swings in gastric pressure that occur with throat clearing, often  promoting self use of mint and menthol lozenges that reduce the lower esophageal sphincter tone and exacerbate the problem further in a cyclical fashion.   These are the same pts (now being labeled as having "irritable larynx syndrome" by some cough centers) who not infrequently have a history of having failed to tolerate ace inhibitors,  dry powder inhalers or biphosphonates or report having atypical reflux symptoms that don't respond to standard doses of PPI , and are easily confused as having aecopd or asthma flares by even experienced allergists/ pulmonologists.   For now rec no change rx then p 3 m consider taper gerd meds and see if flares and if so > GI eval  I had an extended final summary discussion with the patient reviewing all relevant studies completed to date and  lasting 15 to 20 minutes of a 25 minute visit on the following issues:    Explained the natural history of uri (or any significant insult to the upper airway)  and why it's necessary in patients at risk to treat GERD aggressively - at least  short term -   to reduce risk of evolving cyclical cough initially  triggered by epithelial injury and a heightened sensitivty to the effects of any upper airway irritants,  most importantly acid - related - then perpetuated by epithelial injury related to the cough itself as the upper airway collapses on itself.  That is, the more sensitive the epithelium becomes once it is damaged by the virus, the more the ensuing irritability> the more the cough, the more the secondary reflux (especially in those prone to reflux) the  more the irritation of the sensitive mucosa and so on in a  Classic cyclical pattern.    Each maintenance medication was reviewed in detail including most importantly the difference between maintenance and as needed and under what circumstances the prns are to be used.  Please see instructions for details which were reviewed in writing and the patient given a copy.    F/u prn

## 2015-03-02 ENCOUNTER — Other Ambulatory Visit: Payer: Self-pay | Admitting: Pediatrics

## 2015-03-03 ENCOUNTER — Other Ambulatory Visit: Payer: Self-pay | Admitting: Dermatology

## 2015-03-03 DIAGNOSIS — D239 Other benign neoplasm of skin, unspecified: Secondary | ICD-10-CM | POA: Diagnosis not present

## 2015-03-03 DIAGNOSIS — D485 Neoplasm of uncertain behavior of skin: Secondary | ICD-10-CM | POA: Diagnosis not present

## 2015-03-03 DIAGNOSIS — L821 Other seborrheic keratosis: Secondary | ICD-10-CM | POA: Diagnosis not present

## 2015-03-03 DIAGNOSIS — B078 Other viral warts: Secondary | ICD-10-CM | POA: Diagnosis not present

## 2015-03-25 DIAGNOSIS — H43813 Vitreous degeneration, bilateral: Secondary | ICD-10-CM | POA: Diagnosis not present

## 2015-03-25 DIAGNOSIS — H04123 Dry eye syndrome of bilateral lacrimal glands: Secondary | ICD-10-CM | POA: Diagnosis not present

## 2015-03-25 DIAGNOSIS — H25813 Combined forms of age-related cataract, bilateral: Secondary | ICD-10-CM | POA: Diagnosis not present

## 2015-05-11 ENCOUNTER — Other Ambulatory Visit: Payer: Self-pay | Admitting: Pediatrics

## 2015-05-12 ENCOUNTER — Telehealth: Payer: Self-pay | Admitting: Cardiovascular Disease

## 2015-05-12 NOTE — Telephone Encounter (Signed)
Routing to [place].

## 2015-05-12 NOTE — Telephone Encounter (Signed)
Routing to [place].

## 2015-05-12 NOTE — Telephone Encounter (Signed)
She needs a prescription for her C-Pap with the pressure level on it please.Please fax to [phone] Att: Tiffany.

## 2015-05-12 NOTE — Telephone Encounter (Signed)
New message      Pt needs presc for CPAP supplies and a choice of mask.  Her machine is only 67 yr old and she does not need another machine.  Please fax order to [phone]

## 2015-05-14 NOTE — Telephone Encounter (Signed)
Tiffany is calling to follow up on the prescription needed for the C-pap supplies . Please fax the prescription to [phone].Marland Kitchen Thanks

## 2015-05-18 NOTE — Telephone Encounter (Signed)
Patient calling to see if Dr. Claiborne Billings has sent over the fax for her CPAP Machine Supplies. She states she has previously called about not needing a machine but just the supplies and she has not heard back whether if it was faxed or not. She would like a call back on the status of the order.

## 2015-05-19 NOTE — Telephone Encounter (Signed)
New Message   Tiffany with [facility] called to follow up on CPAP order on file. CPAP machine not needed but ths supplies are. They only need a script for the Pap Supplies. Per tiffany with [facility] our office sent the order for CPAP machine  but that is not what is needed she is only in need of the CPAP supplies. Per Tiffany the pt has only 2 weeks before the case goes cold and the process will have to start all over again. Please call back Very important

## 2015-05-19 NOTE — Telephone Encounter (Signed)
Prescription for CPAP supplies faxed to [facility] @ number provided.

## 2015-05-19 NOTE — Telephone Encounter (Signed)
Routing to [name] 

## 2015-06-17 ENCOUNTER — Telehealth: Payer: Self-pay | Admitting: Cardiovascular Disease

## 2015-06-17 NOTE — Telephone Encounter (Signed)
New message     [employer] is faxing over a form that needs to signed by Dr. Claiborne Billings. Thank you

## 2015-06-28 ENCOUNTER — Ambulatory Visit (INDEPENDENT_AMBULATORY_CARE_PROVIDER_SITE_OTHER): Payer: Medicare Other | Admitting: Family

## 2015-06-28 ENCOUNTER — Encounter: Payer: Self-pay | Admitting: Family

## 2015-06-28 VITALS — BP 125/84 | HR 64 | Temp 97.9°F | Ht 63.0 in | Wt 190.8 lb

## 2015-06-28 DIAGNOSIS — L259 Unspecified contact dermatitis, unspecified cause: Secondary | ICD-10-CM | POA: Diagnosis not present

## 2015-06-28 DIAGNOSIS — M2550 Pain in unspecified joint: Secondary | ICD-10-CM

## 2015-06-28 DIAGNOSIS — Z23 Encounter for immunization: Secondary | ICD-10-CM

## 2015-06-28 MED ORDER — TRIAMCINOLONE ACETONIDE 0.5 % EX OINT: 1.0000 "application " | TOPICAL_OINTMENT | Freq: Two times a day (BID) | CUTANEOUS | Status: DC

## 2015-06-28 NOTE — Progress Notes (Signed)
   Subjective:    Patient ID: Laura James, female    DOB: [birth year], 65 y.o.   MRN: [id number]  Rash This is a new problem. The current episode started in the past 7 days. The problem has been gradually improving since onset. The affected locations include the left arm and right arm. The rash is characterized by blistering and itchiness. She was exposed to nothing. Pertinent negatives include no congestion, cough, diarrhea, eye pain, fatigue, joint pain or sore throat. Past treatments include anti-itch cream. The treatment provided mild relief.  Arthritis Presents for initial visit. The disease course has been fluctuating. She complains of pain. Affected locations include the right shoulder, left shoulder, left hip, right knee, left ankle, right ankle and left knee. Her pain is at a severity of 6/10. Associated symptoms include rash. Pertinent negatives include no diarrhea, fatigue, pain while resting or weight loss. Past treatments include rest and NSAIDs. The treatment provided moderate relief.      Review of Systems  Constitutional: Negative for weight loss and fatigue.  HENT: Negative for congestion and sore throat.   Eyes: Negative for pain.  Respiratory: Negative for cough.   Gastrointestinal: Negative for diarrhea.  Musculoskeletal: Positive for arthritis. Negative for joint pain.  Skin: Positive for rash.  All other systems reviewed and are negative.      Objective:   Physical Exam  Constitutional: She is oriented to person, place, and time. She appears well-developed and well-nourished. No distress.  HENT:  Head: Normocephalic.  Eyes: Pupils are equal, round, and reactive to light.  Neck: Normal range of motion. Neck supple. No thyromegaly present.  Cardiovascular: Normal rate, regular rhythm, normal heart sounds and intact distal pulses.   No murmur heard. Pulmonary/Chest: Effort normal and breath sounds normal. No respiratory distress. She has no wheezes.  Abdominal:  Soft. Bowel sounds are normal. She exhibits no distension. There is no tenderness.  Musculoskeletal: Normal range of motion. She exhibits no edema or tenderness.  Neurological: She is alert and oriented to person, place, and time.  Skin: Skin is warm and dry.  Small erythemas blister on left forearm, and right upper arm   Psychiatric: She has a normal mood and affect. Her behavior is normal. Judgment and thought content normal.  Vitals reviewed.    BP 125/84 mmHg  Pulse 64  Temp(Src) 97.9 F (36.6 C) (Oral)  Ht _0  (1.6 m)  Wt 190 lb 12.8 oz (86.546 kg)  BMI 33.81 kg/m2      Assessment & Plan:  1. Contact dermatitis -Do not scratch -Keep clean and dry  -Avoid irritants when possible  -RTO Prn - triamcinolone ointment (KENALOG) 0.5 %; Apply 1 application topically 2 (two) times daily.  Dispense: 30 g; Refill: 0  Zoster vaccine given today  2. Joint pain -Continue aleve as needed -REst -ROM exercises encouraged - Arthritis Panel - CMP14+EGFR  Evelina Dun, FNP

## 2015-06-28 NOTE — Patient Instructions (Signed)
Contact Dermatitis Dermatitis is redness, soreness, and swelling (inflammation) of the skin. Contact dermatitis is a reaction to certain substances that touch the skin. There are two types of contact dermatitis:   Irritant contact dermatitis. This type is caused by something that irritates your skin, such as dry hands from washing them too much. This type does not require previous exposure to the substance for a reaction to occur. This type is more common.  Allergic contact dermatitis. This type is caused by a substance that you are allergic to, such as a nickel allergy or poison ivy. This type only occurs if you have been exposed to the substance (allergen) before. Upon a repeat exposure, your body reacts to the substance. This type is less common. CAUSES  Many different substances can cause contact dermatitis. Irritant contact dermatitis is most commonly caused by exposure to:   Makeup.   Soaps.   Detergents.   Bleaches.   Acids.   Metal salts, such as nickel.  Allergic contact dermatitis is most commonly caused by exposure to:   Poisonous plants.   Chemicals.   Jewelry.   Latex.   Medicines.   Preservatives in products, such as clothing.  RISK FACTORS This condition is more likely to develop in:   People who have jobs that expose them to irritants or allergens.  People who have certain medical conditions, such as asthma or eczema.  SYMPTOMS  Symptoms of this condition may occur anywhere on your body where the irritant has touched you or is touched by you. Symptoms include:  Dryness or flaking.   Redness.   Cracks.   Itching.   Pain or a burning feeling.   Blisters.  Drainage of small amounts of blood or clear fluid from skin cracks. With allergic contact dermatitis, there may also be swelling in areas such as the eyelids, mouth, or genitals.  DIAGNOSIS  This condition is diagnosed with a medical history and physical exam. A patch skin test  may be performed to help determine the cause. If the condition is related to your job, you may need to see an occupational medicine specialist. TREATMENT Treatment for this condition includes figuring out what caused the reaction and protecting your skin from further contact. Treatment may also include:   Steroid creams or ointments. Oral steroid medicines may be needed in more severe cases.  Antibiotics or antibacterial ointments, if a skin infection is present.  Antihistamine lotion or an antihistamine taken by mouth to ease itching.  A bandage (dressing). HOME CARE INSTRUCTIONS Skin Care  Moisturize your skin as needed.   Apply cool compresses to the affected areas.  Try taking a bath with:  Epsom salts. Follow the instructions on the packaging. You can get these at your local pharmacy or grocery store.  Baking soda. Pour a small amount into the bath as directed by your health care provider.  Colloidal oatmeal. Follow the instructions on the packaging. You can get this at your local pharmacy or grocery store.  Try applying baking soda paste to your skin. Stir water into baking soda until it reaches a paste-like consistency.  Do not scratch your skin.  Bathe less frequently, such as every other day.  Bathe in lukewarm water. Avoid using hot water. Medicines  Take or apply over-the-counter and prescription medicines only as told by your health care provider.   If you were prescribed an antibiotic medicine, take or apply your antibiotic as told by your health care provider. Do not stop using the   antibiotic even if your condition starts to improve. General Instructions  Keep all follow-up visits as told by your health care provider. This is important.  Avoid the substance that caused your reaction. If you do not know what caused it, keep a journal to try to track what caused it. Write down:  What you eat.  What cosmetic products you use.  What you drink.  What  you wear in the affected area. This includes jewelry.  If you were given a dressing, take care of it as told by your health care provider. This includes when to change and remove it. SEEK MEDICAL CARE IF:   Your condition does not improve with treatment.  Your condition gets worse.  You have signs of infection such as swelling, tenderness, redness, soreness, or warmth in the affected area.  You have a fever.  You have new symptoms. SEEK IMMEDIATE MEDICAL CARE IF:   You have a severe headache, neck pain, or neck stiffness.  You vomit.  You feel very sleepy.  You notice red streaks coming from the affected area.  Your bone or joint underneath the affected area becomes painful after the skin has healed.  The affected area turns darker.  You have difficulty breathing.   This information is not intended to replace advice given to you by your health care provider. Make sure you discuss any questions you have with your health care provider.   Document Released: 01/07/2000 Document Revised: 09/30/2014 Document Reviewed: 05/27/2014 Elsevier Interactive Patient Education 2016 [employer]vier Inc.  

## 2015-06-29 LAB — ARTHRITIS PANEL
BASOS: 0 %
Basophils Absolute: 0 10*3/uL (ref 0.0–0.2)
EOS (ABSOLUTE): 0.4 10*3/uL (ref 0.0–0.4)
EOS: 4 %
HEMOGLOBIN: 11.4 g/dL (ref 11.1–15.9)
Hematocrit: 35.8 % (ref 34.0–46.6)
IMMATURE GRANS (ABS): 0 10*3/uL (ref 0.0–0.1)
IMMATURE GRANULOCYTES: 0 %
LYMPHS: 40 %
Lymphocytes Absolute: 3.2 10*3/uL — ABNORMAL HIGH (ref 0.7–3.1)
MCH: 24.8 pg — ABNORMAL LOW (ref 26.6–33.0)
MCHC: 31.8 g/dL (ref 31.5–35.7)
MCV: 78 fL — ABNORMAL LOW (ref 79–97)
MONOCYTES: 3 %
Monocytes Absolute: 0.3 10*3/uL (ref 0.1–0.9)
NEUTROS ABS: 4.1 10*3/uL (ref 1.4–7.0)
NEUTROS PCT: 53 %
Platelets: 307 10*3/uL (ref 150–379)
RBC: 4.6 x10E6/uL (ref 3.77–5.28)
RDW: 15.5 % — ABNORMAL HIGH (ref 12.3–15.4)
Rhuematoid fact SerPl-aCnc: <10 IU/mL (ref 0.0–13.9)
SED RATE: 20 mm/h (ref 0–40)
URIC ACID: 4.1 mg/dL (ref 2.5–7.1)
WBC: 8 10*3/uL (ref 3.4–10.8)

## 2015-06-29 LAB — CMP14+EGFR
A/G RATIO: 1.3 (ref 1.2–2.2)
ALT: 15 IU/L (ref 0–32)
AST: 19 IU/L (ref 0–40)
Albumin: 3.8 g/dL (ref 3.6–4.8)
Alkaline Phosphatase: 76 IU/L (ref 39–117)
BUN / CREAT RATIO: 15 (ref 12–28)
BUN: 15 mg/dL (ref 8–27)
Bilirubin Total: 0.3 mg/dL (ref 0.0–1.2)
CALCIUM: 9.6 mg/dL (ref 8.7–10.3)
CO2: 25 mmol/L (ref 18–29)
Chloride: 101 mmol/L (ref 96–106)
Creatinine, Ser: 1.01 mg/dL — ABNORMAL HIGH (ref 0.57–1.00)
GFR, EST AFRICAN AMERICAN: 67 mL/min/{1.73_m2} (ref >59)
GFR, EST NON AFRICAN AMERICAN: 58 mL/min/{1.73_m2} — AB (ref >59)
GLOBULIN, TOTAL: 2.9 g/dL (ref 1.5–4.5)
Glucose: 96 mg/dL (ref 65–99)
POTASSIUM: 3.8 mmol/L (ref 3.5–5.2)
SODIUM: 142 mmol/L (ref 134–144)
TOTAL PROTEIN: 6.7 g/dL (ref 6.0–8.5)

## 2015-07-01 ENCOUNTER — Ambulatory Visit (INDEPENDENT_AMBULATORY_CARE_PROVIDER_SITE_OTHER): Payer: Medicare Other | Admitting: Nurse Practitioner

## 2015-07-01 VITALS — BP 139/87 | HR 66 | Temp 97.7°F | Ht 63.0 in | Wt 194.0 lb

## 2015-07-01 DIAGNOSIS — L247 Irritant contact dermatitis due to plants, except food: Secondary | ICD-10-CM | POA: Diagnosis not present

## 2015-07-01 MED ORDER — PREDNISONE 20 MG PO TABS: ORAL_TABLET | ORAL | Status: DC

## 2015-07-01 MED ORDER — METHYLPREDNISOLONE ACETATE 80 MG/ML IJ SUSP
80.0000 mg | Freq: Once | INTRAMUSCULAR | Status: AC
  Administered 2015-07-01: 80 mg via INTRAMUSCULAR

## 2015-07-01 NOTE — Patient Instructions (Signed)
Poison Ivy Poison ivy is a rash caused by touching the leaves of the poison ivy plant. The rash often shows up 48 hours later. You might just have bumps, redness, and itching. Sometimes, blisters appear and break open. Your eyes may get puffy (swollen). Poison ivy often heals in 2 to 3 weeks without treatment. HOME CARE  If you touch poison ivy:  Wash your skin with soap and water right away. Wash under your fingernails. Do not rub the skin very hard.  Wash any clothes you were wearing.  Avoid poison ivy in the future. Poison ivy has 3 leaves on a stem.  Use medicine to help with itching as told by your doctor. Do not drive when you take this medicine.  Keep open sores dry, clean, and covered with a bandage and medicated cream, if needed.  Ask your doctor about medicine for children. GET HELP RIGHT AWAY IF:  You have open sores.  Redness spreads beyond the area of the rash.  There is yellowish white fluid (pus) coming from the rash.  Pain gets worse.  You have a temperature by mouth above 102 F (38.9 C), not controlled by medicine. MAKE SURE YOU:  Understand these instructions.  Will watch your condition.  Will get help right away if you are not doing well or get worse.   This information is not intended to replace advice given to you by your health care provider. Make sure you discuss any questions you have with your health care provider.   Document Released: 02/11/2010 Document Revised: 04/03/2011 Document Reviewed: 06/17/2014 Elsevier Interactive Patient Education 2016 Elsevier Inc.  

## 2015-07-01 NOTE — Progress Notes (Signed)
   Subjective:    Patient ID: Laura James, female    DOB: 07/03/1950, 65 y.o.   MRN: [id number]  HPI Patient in c/o rash that started Sunday on her forearm- very itchy- not sure what is causing it- has not been doing yard work.    Review of Systems  Constitutional: Negative.   HENT: Negative.   Respiratory: Negative.   Cardiovascular: Negative.   Genitourinary: Negative.   Neurological: Negative.   Psychiatric/Behavioral: Negative.   All other systems reviewed and are negative.      Objective:   Physical Exam  Constitutional: She appears well-developed and well-nourished. No distress.  Cardiovascular: Normal rate, regular rhythm and normal heart sounds.   Pulmonary/Chest: Effort normal and breath sounds normal.  Skin: Skin is warm. Rash (in linear pattern on bil upper ext and mons pubis) noted.   BP 139/87 mmHg  Pulse 66  Temp(Src) 97.7 F (36.5 C) (Oral)  Ht 5\' 3"  (1.6 m)  Wt 194 lb (87.998 kg)  BMI 34.37 kg/m2        Assessment & Plan:  1. Contact dermatitis and eczema due to plant Cool compresses Good handwashing Try to avoid scratching OTC poison oak cream RTO prn - methylPREDNISolone acetate (DEPO-MEDROL) injection 80 mg; Inject 1 mL (80 mg total) into the muscle once. - predniSONE (DELTASONE) 20 MG tablet; 2 po at sametime daily for 5 days- start tomorrow  Dispense: 10 tablet; Refill: 0   Mary-Margaret Hassell Done, FNP

## 2015-08-19 ENCOUNTER — Telehealth: Payer: Self-pay | Admitting: Family Medicine

## 2015-09-09 ENCOUNTER — Other Ambulatory Visit: Payer: Self-pay | Admitting: Family Medicine

## 2015-10-18 ENCOUNTER — Other Ambulatory Visit: Payer: Self-pay | Admitting: Pediatrics

## 2015-10-19 DIAGNOSIS — H04123 Dry eye syndrome of bilateral lacrimal glands: Secondary | ICD-10-CM | POA: Diagnosis not present

## 2015-10-19 DIAGNOSIS — H43813 Vitreous degeneration, bilateral: Secondary | ICD-10-CM | POA: Diagnosis not present

## 2015-10-19 DIAGNOSIS — H25813 Combined forms of age-related cataract, bilateral: Secondary | ICD-10-CM | POA: Diagnosis not present

## 2015-10-21 DIAGNOSIS — Z1231 Encounter for screening mammogram for malignant neoplasm of breast: Secondary | ICD-10-CM | POA: Diagnosis not present

## 2015-10-29 ENCOUNTER — Ambulatory Visit (INDEPENDENT_AMBULATORY_CARE_PROVIDER_SITE_OTHER): Payer: Medicare Other | Admitting: Family Medicine

## 2015-10-29 ENCOUNTER — Encounter: Payer: Self-pay | Admitting: Family Medicine

## 2015-10-29 ENCOUNTER — Ambulatory Visit (INDEPENDENT_AMBULATORY_CARE_PROVIDER_SITE_OTHER): Payer: Medicare Other

## 2015-10-29 VITALS — BP 120/69 | HR 54 | Temp 97.5°F | Ht 63.0 in | Wt 191.0 lb

## 2015-10-29 DIAGNOSIS — R300 Dysuria: Secondary | ICD-10-CM | POA: Diagnosis not present

## 2015-10-29 DIAGNOSIS — M25552 Pain in left hip: Secondary | ICD-10-CM | POA: Diagnosis not present

## 2015-10-29 LAB — URINALYSIS, COMPLETE
Bilirubin, UA: NEGATIVE
Glucose, UA: NEGATIVE
Ketones, UA: NEGATIVE
Nitrite, UA: NEGATIVE
Protein, UA: NEGATIVE
RBC, UA: NEGATIVE
Specific Gravity, UA: 1.02 (ref 1.005–1.030)
Urobilinogen, Ur: 0.2 mg/dL (ref 0.2–1.0)
pH, UA: 5.5 (ref 5.0–7.5)

## 2015-10-29 LAB — MICROSCOPIC EXAMINATION
Bacteria, UA: NONE SEEN
RBC, UA: NONE SEEN /hpf (ref 0–?)

## 2015-10-29 NOTE — Progress Notes (Signed)
BP 120/69   Pulse (!) 54   Temp 97.5 F (36.4 C) (Oral)   Ht 5\' 3"  (1.6 m)   Wt 191 lb (86.6 kg)   BMI 33.83 kg/m    Subjective:    Patient ID: Laura James, female    DOB: 04-17-1950, 65 y.o.   MRN: [id number]  HPI: Laura James is a 65 y.o. female presenting on 10/29/2015 for Leg Pain (pt here today c/o left leg pain near the groin/hip area after feeling a pop yesterday while pushing a heavy object.)   HPI Left hip pain Patient has been having pain in her left hip near her groin after feeling a pop yesterday while pushing a heavy object. She feels like it hurts worse when she sits on it and it hurts directly through to the back near her buttocks as well. The pain is described as a burning sensation in both the front and the back. She denies any bulges or masses or erythema or warmth. She is able to ambulate but does hurt some with ambulation and sitting.  Dysuria Patient has been having dysuria over the past week or 2. She denies any flank pain or fevers or chills. She denies any hematuria. She has had increased urinary frequency as well over this past. She has been trying to increase her fluid intake to combat this.  Relevant past medical, surgical, family and social history reviewed and updated as indicated. Interim medical history since our last visit reviewed. Allergies and medications reviewed and updated.  Review of Systems  Constitutional: Negative for chills and fever.  HENT: Negative for congestion, ear discharge and ear pain.   Eyes: Negative for redness and visual disturbance.  Respiratory: Negative for chest tightness and shortness of breath.   Cardiovascular: Negative for chest pain and leg swelling.  Gastrointestinal: Positive for abdominal pain (Left lower quadrant abdominal pain).  Genitourinary: Negative for difficulty urinating and dysuria.  Musculoskeletal: Positive for arthralgias. Negative for back pain, gait problem and joint swelling.  Skin: Negative for  color change and rash.  Neurological: Negative for light-headedness and headaches.  Psychiatric/Behavioral: Negative for agitation and behavioral problems.  All other systems reviewed and are negative.   Per HPI unless specifically indicated above     Medication List       Accurate as of 10/29/15  2:54 PM. Always use your most recent med list.          albuterol 108 (90 Base) MCG/ACT inhaler Commonly known as:  PROVENTIL HFA;VENTOLIN HFA Inhale 2 puffs into the lungs every 6 (six) hours as needed for wheezing or shortness of breath.   aspirin 81 MG tablet Take 81 mg by mouth daily.   atenolol 25 MG tablet Commonly known as:  TENORMIN TAKE 1 TABLET (25 MG TOTAL) BY MOUTH DAILY. NEEDS TO BE SEEN   B-12 3000 MCG Caps Take 1 capsule by mouth daily.   cetirizine 10 MG tablet Commonly known as:  ZYRTEC Take 10 mg by mouth daily.   fluticasone 50 MCG/ACT nasal spray Commonly known as:  FLONASE Place 2 sprays into both nostrils daily.   hydrochlorothiazide 25 MG tablet Commonly known as:  HYDRODIURIL TAKE 1 TABLET (25 MG TOTAL) BY MOUTH DAILY.   omeprazole 40 MG capsule Commonly known as:  PRILOSEC Take 40 mg by mouth daily as needed.          Objective:    BP 120/69   Pulse (!) 54   Temp 97.5 F (  36.4 C) (Oral)   Ht 5\' 3"  (1.6 m)   Wt 191 lb (86.6 kg)   BMI 33.83 kg/m   Wt Readings from Last 3 Encounters:  10/29/15 191 lb (86.6 kg)  07/01/15 194 lb (88 kg)  06/28/15 190 lb 12.8 oz (86.5 kg)    Physical Exam  Constitutional: She is oriented to person, place, and time. She appears well-developed and well-nourished. No distress.  Eyes: Conjunctivae are normal.  Neck: Neck supple. No thyromegaly present.  Cardiovascular: Normal rate, regular rhythm, normal heart sounds and intact distal pulses.   No murmur heard. Pulmonary/Chest: Effort normal and breath sounds normal. No respiratory distress. She has no wheezes.  Abdominal: Soft. Bowel sounds are normal.  She exhibits no distension. There is tenderness (Left lower quadrant abdominal pain). There is no rebound and no guarding.  Musculoskeletal: Normal range of motion. She exhibits tenderness (Pain near her ischium on the left side). She exhibits no edema.  Lymphadenopathy:    She has no cervical adenopathy.  Neurological: She is alert and oriented to person, place, and time. Coordination normal.  Skin: Skin is warm and dry. No rash noted. She is not diaphoretic.  Psychiatric: She has a normal mood and affect. Her behavior is normal.  Nursing note and vitals reviewed.  Left hip x-ray: No signs of acute bony abnormalities and really no signs of degeneration or arthritis. Await final read by radiology  Urinalysis: 0-5 wbc's and otherwise completely clean urinalysis    Assessment & Plan:   Problem List Items Addressed This Visit    None    Visit Diagnoses    Acute hip pain, left    -  Primary   Relevant Orders   DG HIP UNILAT W OR W/O PELVIS 2-3 VIEWS LEFT   Dysuria       Relevant Orders   Urinalysis, Complete (Completed)       Follow up plan: Return if symptoms worsen or fail to improve.  Counseling provided for all of the vaccine components Orders Placed This Encounter  Procedures  . DG HIP UNILAT W OR W/O PELVIS 2-3 VIEWS LEFT  . Urinalysis, Complete    Caryl Pina, MD [facility] 10/29/2015, 2:54 PM

## 2015-11-22 ENCOUNTER — Telehealth: Payer: Self-pay | Admitting: Family Medicine

## 2015-11-22 NOTE — Telephone Encounter (Signed)
Patient is still experiencing pain in her left thigh area.  Said it radiates from her abdomen down her thigh and into her knee.  Would like to be referred to [facility].  Please advise,.

## 2015-11-22 NOTE — Telephone Encounter (Signed)
lmtcb jkp 10/30

## 2015-11-23 NOTE — Telephone Encounter (Signed)
PT needs to be seen

## 2015-11-23 NOTE — Telephone Encounter (Signed)
Appt made for thigh pain and referral to [facility]

## 2015-11-25 ENCOUNTER — Ambulatory Visit: Payer: Medicare Other | Admitting: Family

## 2015-11-26 ENCOUNTER — Ambulatory Visit (INDEPENDENT_AMBULATORY_CARE_PROVIDER_SITE_OTHER): Payer: Medicare Other | Admitting: Family

## 2015-11-26 ENCOUNTER — Encounter: Payer: Self-pay | Admitting: Family

## 2015-11-26 ENCOUNTER — Encounter (INDEPENDENT_AMBULATORY_CARE_PROVIDER_SITE_OTHER): Payer: Self-pay

## 2015-11-26 VITALS — BP 123/75 | HR 60 | Temp 97.6°F | Ht 63.0 in | Wt 192.0 lb

## 2015-11-26 DIAGNOSIS — M5432 Sciatica, left side: Secondary | ICD-10-CM

## 2015-11-26 MED ORDER — CYCLOBENZAPRINE HCL 5 MG PO TABS: 5.0000 mg | ORAL_TABLET | Freq: Three times a day (TID) | ORAL | 1 refills | Status: DC | PRN

## 2015-11-26 MED ORDER — METHYLPREDNISOLONE ACETATE 80 MG/ML IJ SUSP
80.0000 mg | Freq: Once | INTRAMUSCULAR | Status: AC
  Administered 2015-11-26: 80 mg via INTRAMUSCULAR

## 2015-11-26 MED ORDER — NAPROXEN 500 MG PO TABS
500.0000 mg | ORAL_TABLET | Freq: Two times a day (BID) | ORAL | 1 refills | Status: DC
Start: 2015-11-26 — End: 2016-05-24

## 2015-11-26 MED ORDER — KETOROLAC TROMETHAMINE 60 MG/2ML IM SOLN
60.0000 mg | Freq: Once | INTRAMUSCULAR | Status: AC
  Administered 2015-11-26: 60 mg via INTRAMUSCULAR

## 2015-11-26 NOTE — Patient Instructions (Signed)
Sciatica With Rehab The sciatic nerve runs from the back down the leg and is responsible for sensation and control of the muscles in the back (posterior) side of the thigh, lower leg, and foot. Sciatica is a condition that is characterized by inflammation of this nerve.  SYMPTOMS   Signs of nerve damage, including numbness and/or weakness along the posterior side of the lower extremity.  Pain in the back of the thigh that may also travel down the leg.  Pain that worsens when sitting for long periods of time.  Occasionally, pain in the back or buttock. CAUSES  Inflammation of the sciatic nerve is the cause of sciatica. The inflammation is due to something irritating the nerve. Common sources of irritation include:  Sitting for long periods of time.  Direct trauma to the nerve.  Arthritis of the spine.  Herniated or ruptured disk.  Slipping of the vertebrae (spondylolisthesis).  Pressure from soft tissues, such as muscles or ligament-like tissue (fascia). RISK INCREASES WITH:  Sports that place pressure or stress on the spine (football or weightlifting).  Poor strength and flexibility.  Failure to warm up properly before activity.  Family history of low back pain or disk disorders.  Previous back injury or surgery.  Poor body mechanics, especially when lifting, or poor posture. PREVENTION   Warm up and stretch properly before activity.  Maintain physical fitness:  Strength, flexibility, and endurance.  Cardiovascular fitness.  Learn and use proper technique, especially with posture and lifting. When possible, have coach correct improper technique.  Avoid activities that place stress on the spine. PROGNOSIS If treated properly, then sciatica usually resolves within 6 weeks. However, occasionally surgery is necessary.  RELATED COMPLICATIONS   Permanent nerve damage, including pain, numbness, tingle, or weakness.  Chronic back pain.  Risks of surgery: infection,  bleeding, nerve damage, or damage to surrounding tissues. TREATMENT Treatment initially involves resting from any activities that aggravate your symptoms. The use of ice and medication may help reduce pain and inflammation. The use of strengthening and stretching exercises may help reduce pain with activity. These exercises may be performed at home or with referral to a therapist. A therapist may recommend further treatments, such as transcutaneous electronic nerve stimulation (TENS) or ultrasound. Your caregiver may recommend corticosteroid injections to help reduce inflammation of the sciatic nerve. If symptoms persist despite non-surgical (conservative) treatment, then surgery may be recommended. MEDICATION  If pain medication is necessary, then nonsteroidal anti-inflammatory medications, such as aspirin and ibuprofen, or other minor pain relievers, such as acetaminophen, are often recommended.  Do not take pain medication for 7 days before surgery.  Prescription pain relievers may be given if deemed necessary by your caregiver. Use only as directed and only as much as you need.  Ointments applied to the skin may be helpful.  Corticosteroid injections may be given by your caregiver. These injections should be reserved for the most serious cases, because they may only be given a certain number of times. HEAT AND COLD  Cold treatment (icing) relieves pain and reduces inflammation. Cold treatment should be applied for 10 to 15 minutes every 2 to 3 hours for inflammation and pain and immediately after any activity that aggravates your symptoms. Use ice packs or massage the area with a piece of ice (ice massage).  Heat treatment may be used prior to performing the stretching and strengthening activities prescribed by your caregiver, physical therapist, or athletic trainer. Use a heat pack or soak the injury in warm water.   SEEK MEDICAL CARE IF:  Treatment seems to offer no benefit, or the condition  worsens.  Any medications produce adverse side effects. EXERCISES  RANGE OF MOTION (ROM) AND STRETCHING EXERCISES - Sciatica Most people with sciatic will find that their symptoms worsen with either excessive bending forward (flexion) or arching at the low back (extension). The exercises which will help resolve your symptoms will focus on the opposite motion. Your physician, physical therapist or athletic trainer will help you determine which exercises will be most helpful to resolve your low back pain. Do not complete any exercises without first consulting with your clinician. Discontinue any exercises which worsen your symptoms until you speak to your clinician. If you have pain, numbness or tingling which travels down into your buttocks, leg or foot, the goal of the therapy is for these symptoms to move closer to your back and eventually resolve. Occasionally, these leg symptoms will get better, but your low back pain may worsen; this is typically an indication of progress in your rehabilitation. Be certain to be very alert to any changes in your symptoms and the activities in which you participated in the 24 hours prior to the change. Sharing this information with your clinician will allow him/her to most efficiently treat your condition. These exercises may help you when beginning to rehabilitate your injury. Your symptoms may resolve with or without further involvement from your physician, physical therapist or athletic trainer. While completing these exercises, remember:   Restoring tissue flexibility helps normal motion to return to the joints. This allows healthier, less painful movement and activity.  An effective stretch should be held for at least 30 seconds.  A stretch should never be painful. You should only feel a gentle lengthening or release in the stretched tissue. FLEXION RANGE OF MOTION AND STRETCHING EXERCISES: STRETCH - Flexion, Single Knee to Chest   Lie on a firm bed or floor  with both legs extended in front of you.  Keeping one leg in contact with the floor, bring your opposite knee to your chest. Hold your leg in place by either grabbing behind your thigh or at your knee.  Pull until you feel a gentle stretch in your low back. Hold __________ seconds.  Slowly release your grasp and repeat the exercise with the opposite side. Repeat __________ times. Complete this exercise __________ times per day.  STRETCH - Flexion, Double Knee to Chest  Lie on a firm bed or floor with both legs extended in front of you.  Keeping one leg in contact with the floor, bring your opposite knee to your chest.  Tense your stomach muscles to support your back and then lift your other knee to your chest. Hold your legs in place by either grabbing behind your thighs or at your knees.  Pull both knees toward your chest until you feel a gentle stretch in your low back. Hold __________ seconds.  Tense your stomach muscles and slowly return one leg at a time to the floor. Repeat __________ times. Complete this exercise __________ times per day.  STRETCH - Low Trunk Rotation   Lie on a firm bed or floor. Keeping your legs in front of you, bend your knees so they are both pointed toward the ceiling and your feet are flat on the floor.  Extend your arms out to the side. This will stabilize your upper body by keeping your shoulders in contact with the floor.  Gently and slowly drop both knees together to one side until   you feel a gentle stretch in your low back. Hold for __________ seconds.  Tense your stomach muscles to support your low back as you bring your knees back to the starting position. Repeat the exercise to the other side. Repeat __________ times. Complete this exercise __________ times per day  EXTENSION RANGE OF MOTION AND FLEXIBILITY EXERCISES: STRETCH - Extension, Prone on Elbows  Lie on your stomach on the floor, a bed will be too soft. Place your palms about shoulder  width apart and at the height of your head.  Place your elbows under your shoulders. If this is too painful, stack pillows under your chest.  Allow your body to relax so that your hips drop lower and make contact more completely with the floor.  Hold this position for __________ seconds.  Slowly return to lying flat on the floor. Repeat __________ times. Complete this exercise __________ times per day.  RANGE OF MOTION - Extension, Prone Press Ups  Lie on your stomach on the floor, a bed will be too soft. Place your palms about shoulder width apart and at the height of your head.  Keeping your back as relaxed as possible, slowly straighten your elbows while keeping your hips on the floor. You may adjust the placement of your hands to maximize your comfort. As you gain motion, your hands will come more underneath your shoulders.  Hold this position __________ seconds.  Slowly return to lying flat on the floor. Repeat __________ times. Complete this exercise __________ times per day.  STRENGTHENING EXERCISES - Sciatica  These exercises may help you when beginning to rehabilitate your injury. These exercises should be done near your "sweet spot." This is the neutral, low-back arch, somewhere between fully rounded and fully arched, that is your least painful position. When performed in this safe range of motion, these exercises can be used for people who have either a flexion or extension based injury. These exercises may resolve your symptoms with or without further involvement from your physician, physical therapist or athletic trainer. While completing these exercises, remember:   Muscles can gain both the endurance and the strength needed for everyday activities through controlled exercises.  Complete these exercises as instructed by your physician, physical therapist or athletic trainer. Progress with the resistance and repetition exercises only as your caregiver advises.  You may  experience muscle soreness or fatigue, but the pain or discomfort you are trying to eliminate should never worsen during these exercises. If this pain does worsen, stop and make certain you are following the directions exactly. If the pain is still present after adjustments, discontinue the exercise until you can discuss the trouble with your clinician. STRENGTHENING - Deep Abdominals, Pelvic Tilt   Lie on a firm bed or floor. Keeping your legs in front of you, bend your knees so they are both pointed toward the ceiling and your feet are flat on the floor.  Tense your lower abdominal muscles to press your low back into the floor. This motion will rotate your pelvis so that your tail bone is scooping upwards rather than pointing at your feet or into the floor.  With a gentle tension and even breathing, hold this position for __________ seconds. Repeat __________ times. Complete this exercise __________ times per day.  STRENGTHENING - Abdominals, Crunches   Lie on a firm bed or floor. Keeping your legs in front of you, bend your knees so they are both pointed toward the ceiling and your feet are flat on the   floor. Cross your arms over your chest.  Slightly tip your chin down without bending your neck.  Tense your abdominals and slowly lift your trunk high enough to just clear your shoulder blades. Lifting higher can put excessive stress on the low back and does not further strengthen your abdominal muscles.  Control your return to the starting position. Repeat __________ times. Complete this exercise __________ times per day.  STRENGTHENING - Quadruped, Opposite UE/LE Lift  Assume a hands and knees position on a firm surface. Keep your hands under your shoulders and your knees under your hips. You may place padding under your knees for comfort.  Find your neutral spine and gently tense your abdominal muscles so that you can maintain this position. Your shoulders and hips should form a rectangle  that is parallel with the floor and is not twisted.  Keeping your trunk steady, lift your right hand no higher than your shoulder and then your left leg no higher than your hip. Make sure you are not holding your breath. Hold this position __________ seconds.  Continuing to keep your abdominal muscles tense and your back steady, slowly return to your starting position. Repeat with the opposite arm and leg. Repeat __________ times. Complete this exercise __________ times per day.  STRENGTHENING - Abdominals and Quadriceps, Straight Leg Raise   Lie on a firm bed or floor with both legs extended in front of you.  Keeping one leg in contact with the floor, bend the other knee so that your foot can rest flat on the floor.  Find your neutral spine, and tense your abdominal muscles to maintain your spinal position throughout the exercise.  Slowly lift your straight leg off the floor about 6 inches for a count of 15, making sure to not hold your breath.  Still keeping your neutral spine, slowly lower your leg all the way to the floor. Repeat this exercise with each leg __________ times. Complete this exercise __________ times per day. POSTURE AND BODY MECHANICS CONSIDERATIONS - Sciatica Keeping correct posture when sitting, standing or completing your activities will reduce the stress put on different body tissues, allowing injured tissues a chance to heal and limiting painful experiences. The following are general guidelines for improved posture. Your physician or physical therapist will provide you with any instructions specific to your needs. While reading these guidelines, remember:  The exercises prescribed by your provider will help you have the flexibility and strength to maintain correct postures.  The correct posture provides the optimal environment for your joints to work. All of your joints have less wear and tear when properly supported by a spine with good posture. This means you will  experience a healthier, less painful body.  Correct posture must be practiced with all of your activities, especially prolonged sitting and standing. Correct posture is as important when doing repetitive low-stress activities (typing) as it is when doing a single heavy-load activity (lifting). RESTING POSITIONS Consider which positions are most painful for you when choosing a resting position. If you have pain with flexion-based activities (sitting, bending, stooping, squatting), choose a position that allows you to rest in a less flexed posture. You would want to avoid curling into a fetal position on your side. If your pain worsens with extension-based activities (prolonged standing, working overhead), avoid resting in an extended position such as sleeping on your stomach. Most people will find more comfort when they rest with their spine in a more neutral position, neither too rounded nor too   arched. Lying on a non-sagging bed on your side with a pillow between your knees, or on your back with a pillow under your knees will often provide some relief. Keep in mind, being in any one position for a prolonged period of time, no matter how correct your posture, can still lead to stiffness. PROPER SITTING POSTURE In order to minimize stress and discomfort on your spine, you must sit with correct posture Sitting with good posture should be effortless for a healthy body. Returning to good posture is a gradual process. Many people can work toward this most comfortably by using various supports until they have the flexibility and strength to maintain this posture on their own. When sitting with proper posture, your ears will fall over your shoulders and your shoulders will fall over your hips. You should use the back of the chair to support your upper back. Your low back will be in a neutral position, just slightly arched. You may place a small pillow or folded towel at the base of your low back for support.  When  working at a desk, create an environment that supports good, upright posture. Without extra support, muscles fatigue and lead to excessive strain on joints and other tissues. Keep these recommendations in mind: CHAIR:   A chair should be able to slide under your desk when your back makes contact with the back of the chair. This allows you to work closely.  The chair's height should allow your eyes to be level with the upper part of your monitor and your hands to be slightly lower than your elbows. BODY POSITION  Your feet should make contact with the floor. If this is not possible, use a foot rest.  Keep your ears over your shoulders. This will reduce stress on your neck and low back. INCORRECT SITTING POSTURES   If you are feeling tired and unable to assume a healthy sitting posture, do not slouch or slump. This puts excessive strain on your back tissues, causing more damage and pain. Healthier options include:  Using more support, like a lumbar pillow.  Switching tasks to something that requires you to be upright or walking.  Talking a brief walk.  Lying down to rest in a neutral-spine position. PROLONGED STANDING WHILE SLIGHTLY LEANING FORWARD  When completing a task that requires you to lean forward while standing in one place for a long time, place either foot up on a stationary 2-4 inch high object to help maintain the best posture. When both feet are on the ground, the low back tends to lose its slight inward curve. If this curve flattens (or becomes too large), then the back and your other joints will experience too much stress, fatigue more quickly and can cause pain.  CORRECT STANDING POSTURES Proper standing posture should be assumed with all daily activities, even if they only take a few moments, like when brushing your teeth. As in sitting, your ears should fall over your shoulders and your shoulders should fall over your hips. You should keep a slight tension in your abdominal  muscles to brace your spine. Your tailbone should point down to the ground, not behind your body, resulting in an over-extended swayback posture.  INCORRECT STANDING POSTURES  Common incorrect standing postures include a forward head, locked knees and/or an excessive swayback. WALKING Walk with an upright posture. Your ears, shoulders and hips should all line-up. PROLONGED ACTIVITY IN A FLEXED POSITION When completing a task that requires you to bend forward   at your waist or lean over a low surface, try to find a way to stabilize 3 of 4 of your limbs. You can place a hand or elbow on your thigh or rest a knee on the surface you are reaching across. This will provide you more stability so that your muscles do not fatigue as quickly. By keeping your knees relaxed, or slightly bent, you will also reduce stress across your low back. CORRECT LIFTING TECHNIQUES DO :   Assume a wide stance. This will provide you more stability and the opportunity to get as close as possible to the object which you are lifting.  Tense your abdominals to brace your spine; then bend at the knees and hips. Keeping your back locked in a neutral-spine position, lift using your leg muscles. Lift with your legs, keeping your back straight.  Test the weight of unknown objects before attempting to lift them.  Try to keep your elbows locked down at your sides in order get the best strength from your shoulders when carrying an object.  Always ask for help when lifting heavy or awkward objects. INCORRECT LIFTING TECHNIQUES DO NOT:   Lock your knees when lifting, even if it is a small object.  Bend and twist. Pivot at your feet or move your feet when needing to change directions.  Assume that you cannot safely pick up a paperclip without proper posture.   This information is not intended to replace advice given to you by your health care provider. Make sure you discuss any questions you have with your health care provider.     Document Released: 01/09/2005 Document Revised: 05/26/2014 Document Reviewed: 04/23/2008 [employer] Interactive Patient Education 2016 [employer]  

## 2015-11-26 NOTE — Progress Notes (Addendum)
   Subjective:    Patient ID: Laura James, female    DOB: 1950/08/13, 65 y.o.   MRN: [id number]  PT presents to the office today with recurrent sciatica pain. PT states this started about 2 months ago is unchanged. PT states she is having a constant burning pain of 8 out 10 that is worse when she is sitting. Pt has taken motrin TID with no relief.  Leg Pain        Review of Systems  Musculoskeletal: Positive for back pain.       Left leg pain   All other systems reviewed and are negative.      Objective:   Physical Exam  Constitutional: She is oriented to person, place, and time. She appears well-developed and well-nourished. No distress.  Cardiovascular: Normal rate, regular rhythm, normal heart sounds and intact distal pulses.   No murmur heard. Pulmonary/Chest: Effort normal and breath sounds normal. No respiratory distress. She has no wheezes.  Abdominal: Soft. Bowel sounds are normal. She exhibits no distension. There is no tenderness.  Musculoskeletal: Normal range of motion. She exhibits no edema or tenderness.  Negative straight leg raise  Neurological: She is alert and oriented to person, place, and time.  Skin: Skin is warm and dry.  Psychiatric: She has a normal mood and affect. Her behavior is normal. Judgment and thought content normal.  Vitals reviewed.     BP 123/75   Pulse 60   Temp 97.6 F (36.4 C) (Oral)   Ht 5\' 3"  (1.6 m)   Wt 192 lb (87.1 kg)   BMI 34.01 kg/m      Assessment & Plan:  1. Sciatica of left side -Rest -Ice and heat as needed -ROM and scratches discussed- Handout given -No other NSAID"S while taking naprosyn  -RTO Prn  - methylPREDNISolone acetate (DEPO-MEDROL) injection 80 mg; Inject 1 mL (80 mg total) into the muscle once. - ketorolac (TORADOL) injection 60 mg; Inject 2 mLs (60 mg total) into the muscle once. - naproxen (NAPROSYN) 500 MG tablet; Take 1 tablet (500 mg total) by mouth 2 (two) times daily with a meal.  Dispense: 60  tablet; Refill: 1 - cyclobenzaprine (FLEXERIL) 5 MG tablet; Take 1 tablet (5 mg total) by mouth 3 (three) times daily as needed for muscle spasms.  Dispense: 60 tablet; Refill: 1 - Ambulatory referral to Physical Therapy  Evelina Dun, FNP

## 2015-12-06 ENCOUNTER — Other Ambulatory Visit: Payer: Self-pay | Admitting: Family Medicine

## 2015-12-23 ENCOUNTER — Telehealth: Payer: Self-pay | Admitting: Family Medicine

## 2015-12-23 ENCOUNTER — Ambulatory Visit: Payer: Medicare Other | Admitting: Physical Therapy

## 2015-12-23 NOTE — Telephone Encounter (Signed)
scheduled

## 2015-12-27 ENCOUNTER — Ambulatory Visit: Payer: Medicare Other | Attending: Family | Admitting: Physical Therapy

## 2015-12-27 DIAGNOSIS — M5442 Lumbago with sciatica, left side: Secondary | ICD-10-CM | POA: Insufficient documentation

## 2015-12-27 DIAGNOSIS — M6281 Muscle weakness (generalized): Secondary | ICD-10-CM | POA: Insufficient documentation

## 2015-12-27 DIAGNOSIS — G8929 Other chronic pain: Secondary | ICD-10-CM | POA: Insufficient documentation

## 2015-12-27 NOTE — Therapy (Signed)
[facility] [place], [place], [address] Phone: [phone]   Fax:  [phone]  Physical Therapy Evaluation  Patient Details  Name: Laura James MRN: [id number] Date of Birth: 1950/03/17 Referring Provider: Evelina Dun  Encounter Date: 12/27/2015      PT End of Session - 12/27/15 1306    Visit Number 1   Number of Visits 12   Date for PT Re-Evaluation 02/07/16   PT Start Time 1228   PT Stop Time 1315   PT Time Calculation (min) 47 min   Activity Tolerance Patient tolerated treatment well   Behavior During Therapy [facility] for tasks assessed/performed      Past Medical History:  Diagnosis Date  . Barrett esophagus   . 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 Depressive disorder, not elsewhere classified   . Diverticulosis of colon (without mention of hemorrhage)   . Esophageal reflux   . Essential hypertension, benign   . Gastritis   . Grave's disease   . Hiatal hernia   . Hypothyroidism   . IBS (irritable bowel syndrome)   . Obesity   . Osteopenia   . Sleep apnea   . Symptomatic menopausal or female climacteric states     Past Surgical History:  Procedure Laterality Date  . ABDOMINAL HYSTERECTOMY    . CHOLECYSTECTOMY    . INCONTINENCE SURGERY    . TONSILLECTOMY      There were no vitals filed for this visit.       Subjective Assessment - 12/27/15 1235    Currently in Pain? Yes   Pain Score 5    Pain Location Back   Pain Orientation Lower;Left   Pain Descriptors / Indicators Sore   Pain Type Chronic pain   Pain Radiating Towards Left LE   Pain Onset More than a month ago   Pain Frequency Constant   Aggravating Factors  stand, sit   Pain Relieving Factors reposition            [facility] PT Assessment - 12/27/15 0001      Assessment   Medical Diagnosis sciatica of left side   Referring Provider Evelina Dun   Onset Date/Surgical Date --  6 months  ago     Precautions   Precautions None     Restrictions   Weight Bearing Restrictions No     Balance Screen   Has the patient fallen in the past 6 months No     Prior Function   Level of Independence Independent   Vocation Requirements retired     Mining engineer Comments rounded shoulders, right shoulder elvated     ROM / Strength   AROM / PROM / Strength AROM;Strength     AROM   Overall AROM Comments lumbar ROM WFL, symptoms at end range extension and lateral flexion right and left     Strength   Overall Strength Comments Rt hip flex 4/5, left hip flex 3/5, bilat hip ext 4/5, lt hip abd 4/5, rt hip abd 4/5     Palpation   SI assessment  ttp spinous process to gentle PAs   Palpation comment pt with leg length discrepancy Lt LE presenting longer than Rt LE, able to come to neutral after treatment     Special Tests    Special Tests --  SLR and FABER negative bilat  [facility] Adult PT Treatment/Exercise - 12/27/15 0001      Modalities   Modalities Electrical Stimulation     Electrical Stimulation   Electrical Stimulation Location low back   Electrical Stimulation Action IFC   Electrical Stimulation Parameters to tolerance   Electrical Stimulation Goals Pain     Manual Therapy   Manual therapy comments able to adjust functional leg length discrepancy with MET treatment                PT Education - 12/27/15 1306    Education provided Yes   Education Details HEP, PT POC   Person(s) Educated Patient   Methods Explanation;Demonstration;Handout   Comprehension Returned demonstration;Verbalized understanding             PT Long Term Goals - 12/27/15 1310      PT LONG TERM GOAL #1   Title Pt will be independent in HEP   Time 6   Period Weeks   Status New     PT LONG TERM GOAL #2   Title Pt will improve LE strength to 4+/5 to perform standing without increased symptoms   Time 6   Period Weeks    Status New     PT LONG TERM GOAL #3   Title Pt will demo good form with performing core strengthening and stability exercises   Time 6   Period Weeks   Status New     PT LONG TERM GOAL #4   Title Pt will tolerate riding in car x 30 minutes with pain <3/10   Time 6   Period Weeks   Status New               Plan - 12/27/15 1307    Clinical Impression Statement Pt presents with low back and SI pain raidiating towards Lt LE that is worsened by prolonged sitting or standing still. Pt with decreased strength, impaired posture and decreased activiyt tolerance due to pain. Pt will benefit from skilled PT to address deficits and improve functional mboility   Rehab Potential Good   PT Frequency 2x / week   PT Duration 6 weeks   PT Treatment/Interventions Iontophoresis 37m/ml Dexamethasone;Electrical Stimulation;Functional mobility training;Therapeutic activities;Cryotherapy;Moist Heat;Ultrasound;Patient/family education;Passive range of motion;Neuromuscular re-education;Therapeutic exercise;Taping;Balance training;Manual techniques;Dry needling   PT Next Visit Plan assess HEP, manual and modalities as needed. assess leg length   PT Home Exercise Plan clams, SLR flexion and extension, ab set, bridge   Consulted and Agree with Plan of Care Patient      Patient will benefit from skilled therapeutic intervention in order to improve the following deficits and impairments:  Decreased endurance, Decreased mobility, Decreased activity tolerance, Decreased strength, Pain, Postural dysfunction, Hypomobility  Visit Diagnosis: Muscle weakness (generalized) - Plan: PT plan of care cert/re-cert  Chronic left-sided low back pain with left-sided sciatica - Plan: PT plan of care cert/re-cert      G-Codes - 109/32/351315    Functional Limitation Changing and maintaining body position   Changing and Maintaining Body Position Current Status ((T7322 At least 20 percent but less than 40 percent  impaired, limited or restricted   Changing and Maintaining Body Position Goal Status ((G2542 At least 1 percent but less than 20 percent impaired, limited or restricted       Problem List Patient Active Problem List   Diagnosis Date Noted  . Upper airway cough syndrome 12/12/2014  . Fatigue 07/08/2014  . Obstructive sleep apnea 12/23/2013  . Essential hypertension, benign   .  Hiatal hernia   . Diverticulosis of colon (without mention of hemorrhage) 12/30/2010  . Gastritis, chronic 12/30/2010  . Abdominal pain 08/25/2010  . IBS (irritable bowel syndrome) 08/25/2010  . GERD (gastroesophageal reflux disease) 08/25/2010  . Diarrhea, functional 08/25/2010    Isabelle Course, PT, DPT 12/27/2015, 1:17 PM  [facility] [address] [place], [place], [address] Phone: [phone]   Fax:  [phone]  Name: AMORINA DOERR MRN: [id number] Date of Birth: 1950-07-17

## 2015-12-30 ENCOUNTER — Encounter: Payer: Self-pay | Admitting: Family

## 2015-12-30 ENCOUNTER — Ambulatory Visit: Payer: Medicare Other | Admitting: Physical Therapy

## 2015-12-30 ENCOUNTER — Ambulatory Visit (INDEPENDENT_AMBULATORY_CARE_PROVIDER_SITE_OTHER): Payer: Medicare Other | Admitting: Family

## 2015-12-30 ENCOUNTER — Encounter: Payer: Self-pay | Admitting: Physical Therapy

## 2015-12-30 VITALS — BP 127/80 | HR 68 | Temp 98.0°F | Ht 63.0 in | Wt 191.0 lb

## 2015-12-30 DIAGNOSIS — J069 Acute upper respiratory infection, unspecified: Secondary | ICD-10-CM

## 2015-12-30 DIAGNOSIS — M5442 Lumbago with sciatica, left side: Secondary | ICD-10-CM | POA: Diagnosis not present

## 2015-12-30 DIAGNOSIS — G8929 Other chronic pain: Secondary | ICD-10-CM | POA: Diagnosis not present

## 2015-12-30 DIAGNOSIS — M6281 Muscle weakness (generalized): Secondary | ICD-10-CM

## 2015-12-30 MED ORDER — PREDNISONE 10 MG (21) PO TBPK: ORAL_TABLET | ORAL | 0 refills | Status: DC

## 2015-12-30 MED ORDER — FLUTICASONE PROPIONATE 50 MCG/ACT NA SUSP: 2.0000 | Freq: Every day | NASAL | 6 refills | Status: DC

## 2015-12-30 NOTE — Therapy (Signed)
[facility] [place], [place], [address] Phone: [phone]   Fax:  [phone]  Physical Therapy Treatment  Patient Details  Name: Laura James MRN: [id number] Date of Birth: 1950-09-24 Referring Provider: Evelina Dun  Encounter Date: 12/30/2015      PT End of Session - 12/30/15 0945    Visit Number 2   Number of Visits 12   Date for PT Re-Evaluation 02/07/16   PT Start Time 0945   PT Stop Time 1032   PT Time Calculation (min) 47 min   Activity Tolerance Patient tolerated treatment well   Behavior During Therapy [facility] for tasks assessed/performed      Past Medical History:  Diagnosis Date  . Barrett esophagus   . 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 Depressive disorder, not elsewhere classified   . Diverticulosis of colon (without mention of hemorrhage)   . Esophageal reflux   . Essential hypertension, benign   . Gastritis   . Grave's disease   . Hiatal hernia   . Hypothyroidism   . IBS (irritable bowel syndrome)   . Obesity   . Osteopenia   . Sleep apnea   . Symptomatic menopausal or female climacteric states     Past Surgical History:  Procedure Laterality Date  . ABDOMINAL HYSTERECTOMY    . CHOLECYSTECTOMY    . INCONTINENCE SURGERY    . TONSILLECTOMY      There were no vitals filed for this visit.      Subjective Assessment - 12/30/15 0944    Subjective Reports low back soreness and still has some pain in LLE.   Limitations Sitting   How long can you sit comfortably? 20 minutes   How long can you stand comfortably? 15-20 minutes   Patient Stated Goals reduce pain, be able to ride in car without pain, improve strength   Currently in Pain? Yes   Pain Score 5    Pain Location Back   Pain Orientation Lower   Pain Descriptors / Indicators Sore   Pain Type Chronic pain   Pain Radiating Towards LLE   Pain Onset More than a month ago             [facility] PT Assessment - 12/30/15 0001      Assessment   Medical Diagnosis sciatica of left side   Next MD Visit None scheduled     Precautions   Precautions None     Restrictions   Weight Bearing Restrictions No                     [facility] Adult PT Treatment/Exercise - 12/30/15 0001      Exercises   Exercises Lumbar     Lumbar Exercises: Stretches   Single Knee to Chest Stretch 5 reps;30 seconds   Single Knee to Chest Stretch Limitations BLE   Piriformis Stretch 5 reps;30 seconds   Piriformis Stretch Limitations BLE; good stretch noted with LLE     Lumbar Exercises: Supine   Ab Set 20 reps;5 seconds   Bent Knee Raise 20 reps   Bridge 10 reps;5 seconds   Straight Leg Raise 15 reps   Straight Leg Raises Limitations BLE     Modalities   Modalities Electrical Stimulation;Moist Heat     Moist Heat Therapy   Number Minutes Moist Heat 15 Minutes   Moist Heat Location Lumbar Spine     Electrical Stimulation  Electrical Stimulation Location B low back   Electrical Stimulation Action Pre-Mod   Electrical Stimulation Parameters 80-150 hz x15 min   Electrical Stimulation Goals Pain                PT Education - 12/30/15 1005    Education provided Yes   Education Details HEP- posture/ ADLs handout   Person(s) Educated Patient   Methods Explanation;Demonstration;Handout   Comprehension Verbalized understanding;Returned demonstration             PT Long Term Goals - 12/30/15 1030      PT LONG TERM GOAL #1   Title Pt will be independent in HEP   Time 6   Period Weeks   Status Achieved     PT LONG TERM GOAL #2   Title Pt will improve LE strength to 4+/5 to perform standing without increased symptoms   Time 6   Period Weeks   Status On-going     PT LONG TERM GOAL #3   Title Pt will demo good form with performing core strengthening and stability exercises   Time 6   Period Weeks   Status On-going     PT LONG TERM GOAL #4   Title Pt will  tolerate riding in car x 30 minutes with pain <3/10   Time 6   Period Weeks   Status On-going               Plan - 12/30/15 1027    Clinical Impression Statement Patient arrived to clinic with reports of low back soreness and some LLE symptoms.  Patient able to be introduced to low back/hip strengthening with only complaint of increased stretch sensation with L PIriformis stretching. Patient compliant with core activation and was able to complete exercises and stretches with good technique and form in slow, careful timing. Patient educated regarding posture/ADLs handout with patient verbalizing understanding of handout education. Normal modalities response noted following removal of the modalities. Patient also educated to continue core strengthening HEP as provided in previous treatment while also implementing posture/ADLs handout.   Rehab Potential Good   PT Frequency 2x / week   PT Duration 6 weeks   PT Treatment/Interventions Iontophoresis 4mg /ml Dexamethasone;Electrical Stimulation;Functional mobility training;Therapeutic activities;Cryotherapy;Moist Heat;Ultrasound;Patient/family education;Passive range of motion;Neuromuscular re-education;Therapeutic exercise;Taping;Balance training;Manual techniques;Dry needling   PT Next Visit Plan assess HEP, manual and modalities as needed. assess leg length   PT Home Exercise Plan clams, SLR flexion and extension, ab set, bridge   Consulted and Agree with Plan of Care Patient      Patient will benefit from skilled therapeutic intervention in order to improve the following deficits and impairments:  Decreased endurance, Decreased mobility, Decreased activity tolerance, Decreased strength, Pain, Postural dysfunction, Hypomobility  Visit Diagnosis: Muscle weakness (generalized)  Chronic left-sided low back pain with left-sided sciatica     Problem List Patient Active Problem List   Diagnosis Date Noted  . Upper airway cough syndrome  12/12/2014  . Fatigue 07/08/2014  . Obstructive sleep apnea 12/23/2013  . Essential hypertension, benign   . Hiatal hernia   . Diverticulosis of colon (without mention of hemorrhage) 12/30/2010  . Gastritis, chronic 12/30/2010  . Abdominal pain 08/25/2010  . IBS (irritable bowel syndrome) 08/25/2010  . GERD (gastroesophageal reflux disease) 08/25/2010  . Diarrhea, functional 08/25/2010    Wynelle Fanny, PTA 12/30/2015, 10:37 AM  [facility] [address] [place], [place], [address] Phone: [phone]   Fax:  [phone]  Name:  Laura James MRN: [id number] Date of Birth: [birth year]

## 2015-12-30 NOTE — Progress Notes (Signed)
   Subjective:    Patient ID: Laura James, female    DOB: [birth year], 65 y.o.   MRN: [id number]  Sinus Problem  This is a new problem. The current episode started yesterday. The problem is unchanged. There has been no fever. Her pain is at a severity of 0/10. She is experiencing no pain. Associated symptoms include congestion, coughing, a hoarse voice and a sore throat. Pertinent negatives include no ear pain, headaches or sinus pressure. Past treatments include oral decongestants. The treatment provided mild relief.      Review of Systems  HENT: Positive for congestion, hoarse voice and sore throat. Negative for ear pain and sinus pressure.   Respiratory: Positive for cough.   Neurological: Negative for headaches.  All other systems reviewed and are negative.      Objective:   Physical Exam  Constitutional: She is oriented to person, place, and time. She appears well-developed and well-nourished. No distress.  HENT:  Head: Normocephalic and atraumatic.  Eyes: Pupils are equal, round, and reactive to light.  Neck: Normal range of motion. Neck supple. No thyromegaly present.  Cardiovascular: Normal rate, regular rhythm, normal heart sounds and intact distal pulses.   No murmur heard. Pulmonary/Chest: Effort normal and breath sounds normal. No respiratory distress. She has no wheezes.  Abdominal: Soft. Bowel sounds are normal. She exhibits no distension. There is no tenderness.  Musculoskeletal: Normal range of motion. She exhibits no edema or tenderness.  Neurological: She is alert and oriented to person, place, and time. She has normal reflexes. No cranial nerve deficit.  Skin: Skin is warm and dry.  Psychiatric: She has a normal mood and affect. Her behavior is normal. Judgment and thought content normal.  Vitals reviewed.     BP 127/80 (BP Location: Right Arm, Patient Position: Sitting, Cuff Size: Normal)   Pulse 68   Temp 98 F (36.7 C) (Oral)   Ht 5\' 3"  (1.6 m)   Wt 191  lb (86.6 kg)   BMI 33.83 kg/m      Assessment & Plan:  1. Acute upper respiratory infection - Take meds as prescribed - Use a cool mist humidifier  -Use saline nose sprays frequently -Saline irrigations of the nose can be very helpful if done frequently.  * 4X daily for 1 week*  * Use of a nettie pot can be helpful with this. Follow directions with this* -Force fluids -For any cough or congestion  Use plain Mucinex- regular strength or max strength is fine   * Children- consult with Pharmacist for dosing -For fever or aces or pains- take tylenol or ibuprofen appropriate for age and weight.  * for fevers greater than 101 orally you may alternate ibuprofen and tylenol every  3 hours. -Throat lozenges if help -New toothbrush in 3 days - fluticasone (FLONASE) 50 MCG/ACT nasal spray; Place 2 sprays into both nostrils daily.  Dispense: 16 g; Refill: 6 - predniSONE (STERAPRED UNI-PAK 21 TAB) 10 MG (21) TBPK tablet; Use as directed  Dispense: 21 tablet; Refill: 0  Evelina Dun, FNP

## 2015-12-30 NOTE — Patient Instructions (Signed)
Brushing Teeth    Place one foot on ledge and one hand on counter. Bend other knee slightly to keep back straight.  Copyright  VHI. All rights reserved.  Refrigerator   Squat with knees apart to reach lower shelves and drawers.   Copyright  VHI. All rights reserved.  Laundry [employer] down and hold basket close to stand. Use leg muscles to do the work.   Copyright  VHI. All rights reserved.  Housework - Vacuuming   Hold the vacuum with arm held at side. Step back and forth to move it, keeping head up. Avoid twisting.   Copyright  VHI. All rights reserved.  Housework - Wiping   Position yourself as close as possible to reach work surface. Avoid straining your back.   Copyright  VHI. All rights reserved.  Gardening - Mowing   Keep arms close to sides and walk with lawn mower.   Copyright  VHI. All rights reserved.  Sleeping on Side   Place pillow between knees. Use cervical support under neck and a roll around waist as needed.   Copyright  VHI. All rights reserved.  Log Roll   Lying on back, bend left knee and place left arm across chest. Roll all in one movement to the right. Reverse to roll to the left. Always move as one unit.   Copyright  VHI. All rights reserved.  Stand to Sit / Sit to Stand   To sit: Bend knees to lower self onto front edge of chair, then scoot back on seat. To stand: Reverse sequence by placing one foot forward, and scoot to front of seat. Use rocking motion to stand up.  Copyright  VHI. All rights reserved.  Posture - Standing   Good posture is important. Avoid slouching and forward head thrust. Maintain curve in low back and align ears over shoul- ders, hips over ankles.   Copyright  VHI. All rights reserved.  Posture - Sitting   Sit upright, head facing forward. Try using a roll to support lower back. Keep shoulders relaxed, and avoid rounded back. Keep hips level with knees. Avoid crossing legs for long  periods.   Copyright  VHI. All rights reserved.  Computer Work   Position work to Programmer, multimedia. Use proper work and seat height. Keep shoulders back and down, wrists straight, and elbows at right angles. Use chair that provides full back support. Add footrest and lumbar roll as needed.   Copyright  VHI. All rights reserve

## 2015-12-30 NOTE — Patient Instructions (Signed)
Upper Respiratory Infection, Adult Most upper respiratory infections (URIs) are a viral infection of the air passages leading to the lungs. A URI affects the nose, throat, and upper air passages. The most common type of URI is nasopharyngitis and is typically referred to as "the common cold." URIs run their course and usually go away on their own. Most of the time, a URI does not require medical attention, but sometimes a bacterial infection in the upper airways can follow a viral infection. This is called a secondary infection. Sinus and middle ear infections are common types of secondary upper respiratory infections. Bacterial pneumonia can also complicate a URI. A URI can worsen asthma and chronic obstructive pulmonary disease (COPD). Sometimes, these complications can require emergency medical care and may be life threatening. What are the causes? Almost all URIs are caused by viruses. A virus is a type of germ and can spread from one person to another. What increases the risk? You may be at risk for a URI if:  You smoke.  You have chronic heart or lung disease.  You have a weakened defense (immune) system.  You are very young or very old.  You have nasal allergies or asthma.  You work in crowded or poorly ventilated areas.  You work in health care facilities or schools.  What are the signs or symptoms? Symptoms typically develop 2-3 days after you come in contact with a cold virus. Most viral URIs last 7-10 days. However, viral URIs from the influenza virus (flu virus) can last 14-18 days and are typically more severe. Symptoms may include:  Runny or stuffy (congested) nose.  Sneezing.  Cough.  Sore throat.  Headache.  Fatigue.  Fever.  Loss of appetite.  Pain in your forehead, behind your eyes, and over your cheekbones (sinus pain).  Muscle aches.  How is this diagnosed? Your health care provider may diagnose a URI by:  Physical exam.  Tests to check that your  symptoms are not due to another condition such as: ? Strep throat. ? Sinusitis. ? Pneumonia. ? Asthma.  How is this treated? A URI goes away on its own with time. It cannot be cured with medicines, but medicines may be prescribed or recommended to relieve symptoms. Medicines may help:  Reduce your fever.  Reduce your cough.  Relieve nasal congestion.  Follow these instructions at home:  Take medicines only as directed by your health care provider.  Gargle warm saltwater or take cough drops to comfort your throat as directed by your health care provider.  Use a warm mist humidifier or inhale steam from a shower to increase air moisture. This may make it easier to breathe.  Drink enough fluid to keep your urine clear or pale yellow.  Eat soups and other clear broths and maintain good nutrition.  Rest as needed.  Return to work when your temperature has returned to normal or as your health care provider advises. You may need to stay home longer to avoid infecting others. You can also use a face mask and careful hand washing to prevent spread of the virus.  Increase the usage of your inhaler if you have asthma.  Do not use any tobacco products, including cigarettes, chewing tobacco, or electronic cigarettes. If you need help quitting, ask your health care provider. How is this prevented? The best way to protect yourself from getting a cold is to practice good hygiene.  Avoid oral or hand contact with people with cold symptoms.  Wash your   hands often if contact occurs.  There is no clear evidence that vitamin C, vitamin E, echinacea, or exercise reduces the chance of developing a cold. However, it is always recommended to get plenty of rest, exercise, and practice good nutrition. Contact a health care provider if:  You are getting worse rather than better.  Your symptoms are not controlled by medicine.  You have chills.  You have worsening shortness of breath.  You have  brown or red mucus.  You have yellow or brown nasal discharge.  You have pain in your face, especially when you bend forward.  You have a fever.  You have swollen neck glands.  You have pain while swallowing.  You have white areas in the back of your throat. Get help right away if:  You have severe or persistent: ? Headache. ? Ear pain. ? Sinus pain. ? Chest pain.  You have chronic lung disease and any of the following: ? Wheezing. ? Prolonged cough. ? Coughing up blood. ? A change in your usual mucus.  You have a stiff neck.  You have changes in your: ? Vision. ? Hearing. ? Thinking. ? Mood. This information is not intended to replace advice given to you by your health care provider. Make sure you discuss any questions you have with your health care provider. Document Released: 07/05/2000 Document Revised: 09/12/2015 Document Reviewed: 04/16/2013 Elsevier Interactive Patient Education  2017 [employer].  

## 2016-01-03 ENCOUNTER — Encounter: Payer: Self-pay | Admitting: Physical Therapy

## 2016-01-03 ENCOUNTER — Ambulatory Visit: Payer: Medicare Other | Admitting: Physical Therapy

## 2016-01-03 DIAGNOSIS — G8929 Other chronic pain: Secondary | ICD-10-CM

## 2016-01-03 DIAGNOSIS — M6281 Muscle weakness (generalized): Secondary | ICD-10-CM

## 2016-01-03 DIAGNOSIS — M5442 Lumbago with sciatica, left side: Secondary | ICD-10-CM

## 2016-01-03 NOTE — Therapy (Signed)
[facility] [place], [place], [address] Phone: [phone]   Fax:  [phone]  Physical Therapy Treatment  Patient Details  Name: Laura James MRN: [id number] Date of Birth: 11/28/1950 Referring Provider: Evelina Dun  Encounter Date: 01/03/2016      PT End of Session - 01/03/16 1242    Visit Number 3   Number of Visits 12   Date for PT Re-Evaluation 02/07/16   PT Start Time [id number]   PT Stop Time 1314   PT Time Calculation (min) 43 min   Activity Tolerance Patient tolerated treatment well   Behavior During Therapy [facility] for tasks assessed/performed      Past Medical History:  Diagnosis Date  . Barrett esophagus   . 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 Depressive disorder, not elsewhere classified   . Diverticulosis of colon (without mention of hemorrhage)   . Esophageal reflux   . Essential hypertension, benign   . Gastritis   . Grave's disease   . Hiatal hernia   . Hypothyroidism   . IBS (irritable bowel syndrome)   . Obesity   . Osteopenia   . Sleep apnea   . Symptomatic menopausal or female climacteric states     Past Surgical History:  Procedure Laterality Date  . ABDOMINAL HYSTERECTOMY    . CHOLECYSTECTOMY    . INCONTINENCE SURGERY    . TONSILLECTOMY      There were no vitals filed for this visit.      Subjective Assessment - 01/03/16 1232    Subjective Reports low back soreness and still has some pain in LLE.   Limitations Sitting   How long can you sit comfortably? 20 minutes   Patient Stated Goals reduce pain, be able to ride in car without pain, improve strength   Currently in Pain? Yes   Pain Score 4    Pain Location Hip   Pain Orientation Lower   Pain Descriptors / Indicators Sore   Pain Type Chronic pain   Pain Radiating Towards LLE( left buttock down to post knee)   Pain Onset More than a month ago   Pain Frequency Constant    Aggravating Factors  prolong sitting in car   Pain Relieving Factors reposition                         [facility] Adult PT Treatment/Exercise - 01/03/16 0001      Lumbar Exercises: Stretches   Active Hamstring Stretch 3 reps;30 seconds   Single Knee to Chest Stretch 3 reps;30 seconds   Single Knee to Chest Stretch Limitations BLE   Piriformis Stretch 3 reps;30 seconds   Piriformis Stretch Limitations knee to opp shoulder x3 30sec     Lumbar Exercises: Supine   Ab Set 20 reps;5 seconds   Bent Knee Raise 3 seconds  2x10   Bridge 3 seconds  2x10   Straight Leg Raise 3 seconds  2x10     Moist Heat Therapy   Number Minutes Moist Heat 15 Minutes   Moist Heat Location Lumbar Spine     Electrical Stimulation   Electrical Stimulation Location B low back   Electrical Stimulation Action premod   Electrical Stimulation Parameters 80-150hz  x36min   Electrical Stimulation Goals Pain                     PT Long Term Goals - 12/30/15 1030  PT LONG TERM GOAL #1   Title Pt will be independent in HEP   Time 6   Period Weeks   Status Achieved     PT LONG TERM GOAL #2   Title Pt will improve LE strength to 4+/5 to perform standing without increased symptoms   Time 6   Period Weeks   Status On-going     PT LONG TERM GOAL #3   Title Pt will demo good form with performing core strengthening and stability exercises   Time 6   Period Weeks   Status On-going     PT LONG TERM GOAL #4   Title Pt will tolerate riding in car x 30 minutes with pain <3/10   Time 6   Period Weeks   Status On-going               Plan - 01/03/16 1245    Clinical Impression Statement Patient tolerated treatment well today. Patient has had relief after treatments thus far and pain level reported less today. Patient has good technique with core exercises with minimal cues. Patient had palpable soreness in left SI area. Patient has reported more pain with riding in a car  for a prolong time. Patient makeing improvement yet goals ongoing due to pain deficts.   Rehab Potential Good   PT Frequency 2x / week   PT Duration 6 weeks   PT Treatment/Interventions Iontophoresis 4mg /ml Dexamethasone;Electrical Stimulation;Functional mobility training;Therapeutic activities;Cryotherapy;Moist Heat;Ultrasound;Patient/family education;Passive range of motion;Neuromuscular re-education;Therapeutic exercise;Taping;Balance training;Manual techniques;Dry needling   PT Next Visit Plan cont with POC for manual and modalities( may add [place] to SI area) as needed. assess leg length   Consulted and Agree with Plan of Care Patient      Patient will benefit from skilled therapeutic intervention in order to improve the following deficits and impairments:  Decreased endurance, Decreased mobility, Decreased activity tolerance, Decreased strength, Pain, Postural dysfunction, Hypomobility  Visit Diagnosis: Muscle weakness (generalized)  Chronic left-sided low back pain with left-sided sciatica     Problem List Patient Active Problem List   Diagnosis Date Noted  . Upper airway cough syndrome 12/12/2014  . Fatigue 07/08/2014  . Obstructive sleep apnea 12/23/2013  . Essential hypertension, benign   . Hiatal hernia   . Diverticulosis of colon (without mention of hemorrhage) 12/30/2010  . Gastritis, chronic 12/30/2010  . Abdominal pain 08/25/2010  . IBS (irritable bowel syndrome) 08/25/2010  . GERD (gastroesophageal reflux disease) 08/25/2010  . Diarrhea, functional 08/25/2010    Kylie Gros P, PTA 01/03/2016, 1:20 PM  [facility] [place], [place], [address] Phone: [phone]   Fax:  [phone]  Name: Laura James MRN: [id number] Date of Birth: 1950-09-08

## 2016-01-07 ENCOUNTER — Ambulatory Visit: Payer: Medicare Other | Admitting: Physical Therapy

## 2016-01-07 DIAGNOSIS — G8929 Other chronic pain: Secondary | ICD-10-CM | POA: Diagnosis not present

## 2016-01-07 DIAGNOSIS — M6281 Muscle weakness (generalized): Secondary | ICD-10-CM | POA: Diagnosis not present

## 2016-01-07 DIAGNOSIS — M5442 Lumbago with sciatica, left side: Secondary | ICD-10-CM | POA: Diagnosis not present

## 2016-01-07 NOTE — Therapy (Signed)
[facility] [place], [place], [address] Phone: [phone]   Fax:  [phone]  Physical Therapy Treatment  Patient Details  Name: Laura James MRN: [id number] Date of Birth: [birth year] Referring Provider: Evelina Dun  Encounter Date: 01/07/2016      PT End of Session - 01/07/16 0949    Visit Number 4   Number of Visits 12   Date for PT Re-Evaluation 02/07/16   PT Start Time 0949   PT Stop Time 1046   PT Time Calculation (min) 57 min   Activity Tolerance Patient tolerated treatment well   Behavior During Therapy [facility] for tasks assessed/performed      Past Medical History:  Diagnosis Date  . Barrett esophagus   . 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 Depressive disorder, not elsewhere classified   . Diverticulosis of colon (without mention of hemorrhage)   . Esophageal reflux   . Essential hypertension, benign   . Gastritis   . Grave's disease   . Hiatal hernia   . Hypothyroidism   . IBS (irritable bowel syndrome)   . Obesity   . Osteopenia   . Sleep apnea   . Symptomatic menopausal or female climacteric states     Past Surgical History:  Procedure Laterality Date  . ABDOMINAL HYSTERECTOMY    . CHOLECYSTECTOMY    . INCONTINENCE SURGERY    . TONSILLECTOMY      There were no vitals filed for this visit.      Subjective Assessment - 01/07/16 0949    Subjective Today it feels better.    Patient Stated Goals reduce pain, be able to ride in car without pain, improve strength   Currently in Pain? Yes   Pain Score 1    Pain Location Hip   Pain Orientation Lower;Left   Pain Radiating Towards LLE buttock to knee   Pain Onset More than a month ago   Pain Frequency Constant   Aggravating Factors  driving    Pain Relieving Factors reposition            [facility] PT Assessment - 01/07/16 0001      Posture/Postural Control   Posture Comments depressed R  shoulder, mild tightness R QL, mild ER of R hip.     Palpation   Palpation comment TTP of R gluteals and SI area                     [facility] Adult PT Treatment/Exercise - 01/07/16 0001      Lumbar Exercises: Supine   Other Supine Lumbar Exercises self MET technique to correct ant L ilia rotation; Single leg bridge 5 sec hold x 5     Lumbar Exercises: Prone   Other Prone Lumbar Exercises pelvic press series with knee bends unilateral and B; hip ext bil and mule kick all x 5 each     Modalities   Modalities Electrical Stimulation;Moist Heat;Ultrasound     Moist Heat Therapy   Number Minutes Moist Heat 15 Minutes   Moist Heat Location Lumbar Spine     Electrical Stimulation   Electrical Stimulation Location B lumbar glutes IFC 80-150 Hz x 15 min to tolerance   Electrical Stimulation Goals Pain     Ultrasound   Ultrasound Location B SIJ   Ultrasound Parameters 1.5 w/cm2 1.0 mhz cont x 10 min   Ultrasound Goals Pain     Manual Therapy  Manual Therapy Soft tissue mobilization   Soft tissue mobilization to B gluteals and lumbar                PT Education - 01/07/16 1036    Education provided Yes   Education Details HEP and dry needling education; self MET    Person(s) Educated Patient   Methods Explanation;Demonstration;Handout   Comprehension Verbalized understanding;Returned demonstration             PT Long Term Goals - 01/07/16 1038      PT LONG TERM GOAL #1   Title Pt will be independent in HEP   Time 6   Period Weeks   Status Achieved     PT LONG TERM GOAL #2   Title Pt will improve LE strength to 4+/5 to perform standing without increased symptoms   Time 6   Period Weeks   Status On-going     PT LONG TERM GOAL #3   Title Pt will demo good form with performing core strengthening and stability exercises   Time 6   Period Weeks   Status On-going     PT LONG TERM GOAL #4   Title Pt will tolerate riding in car x 30 minutes with  pain <3/10   Time 6   Period Weeks   Status On-going               Plan - 01/07/16 1042    Clinical Impression Statement Patient presented with overall improvement in pain today. She demonstrated increased tissue tension and TPs in R gluteals vs left and was educated on TP dry needling. She had an ant rotated ilia today which was self corrected with MET technique. Goals are ongoing.    Rehab Potential Good   PT Frequency 2x / week   PT Duration 6 weeks   PT Treatment/Interventions Iontophoresis 7m/ml Dexamethasone;Electrical Stimulation;Functional mobility training;Therapeutic activities;Cryotherapy;Moist Heat;Ultrasound;Patient/family education;Passive range of motion;Neuromuscular re-education;Therapeutic exercise;Taping;Balance training;Manual techniques;Dry needling   PT Next Visit Plan Continue to assess leg length for rotated ilia. Continue core and SI stab exercises. Modalities prn.   PT Home Exercise Plan Self MET (single leg bridge left); pelvic press series; clams, SLR flexion and extension, ab set, bridge   Consulted and Agree with Plan of Care Patient      Patient will benefit from skilled therapeutic intervention in order to improve the following deficits and impairments:  Decreased endurance, Decreased mobility, Decreased activity tolerance, Decreased strength, Pain, Postural dysfunction, Hypomobility  Visit Diagnosis: Chronic left-sided low back pain with left-sided sciatica  Muscle weakness (generalized)     Problem List Patient Active Problem List   Diagnosis Date Noted  . Upper airway cough syndrome 12/12/2014  . Fatigue 07/08/2014  . Obstructive sleep apnea 12/23/2013  . Essential hypertension, benign   . Hiatal hernia   . Diverticulosis of colon (without mention of hemorrhage) 12/30/2010  . Gastritis, chronic 12/30/2010  . Abdominal pain 08/25/2010  . IBS (irritable bowel syndrome) 08/25/2010  . GERD (gastroesophageal reflux disease) 08/25/2010  .  Diarrhea, functional 08/25/2010    JMadelyn FlavorsPT 01/07/2016, 10:46 AM  [facility] [address]Phone: [phone]  Fax:  [phone] Name: Laura RANDLEMRN: [id number] of Birth: 7Sep 13, 1952

## 2016-01-07 NOTE — Patient Instructions (Addendum)
Trigger Point Dry Needling  . What is Trigger Point Dry Needling (DN)? o DN is a physical therapy technique used to treat muscle pain and dysfunction. Specifically, DN helps deactivate muscle trigger points (muscle knots).  o A thin filiform needle is used to penetrate the skin and stimulate the underlying trigger point. The goal is for a local twitch response (LTR) to occur and for the trigger point to relax. No medication of any kind is injected during the procedure.   . What Does Trigger Point Dry Needling Feel Like?  o The procedure feels different for each individual patient. Some patients report that they do not actually feel the needle enter the skin and overall the process is not painful. Very mild bleeding may occur. However, many patients feel a deep cramping in the muscle in which the needle was inserted. This is the local twitch response.   Marland Kitchen How Will I feel after the treatment? o Soreness is normal, and the onset of soreness may not occur for a few hours. Typically this soreness does not last longer than two days.  o Bruising is uncommon, however; ice can be used to decrease any possible bruising.  o In rare cases feeling tired or nauseous after the treatment is normal. In addition, your symptoms may get worse before they get better, this period will typically not last longer than 24 hours.   . What Can I do After My Treatment? o Increase your hydration by drinking more water for the next 24 hours. o You may place ice or heat on the areas treated that have become sore, however, do not use heat on inflamed or bruised areas. Heat often brings more relief post needling. o You can continue your regular activities, but vigorous activity is not recommended initially after the treatment for 24 hours. o DN is best combined with other physical therapy such as strengthening, stretching, and other therapies.    Precautions:  In some cases, dry needling is done over the lung field. While rare,  there is a risk of pneumothorax (punctured lung). Because of this, if you ever experience shortness of breath on exertion, difficulty taking a deep breath, chest pain or a dry cough following dry needling, you should report to an emergency room and tell them that you have been dry needled over the thorax.   Pelvic Press  tewstubg   Place hands under belly between navel and pubic bone, palms up. Feel pressure on hands. Increase pressure on hands by pressing pelvis down. This is NOT a pelvic tilt. Hold __5_ seconds. Relax. Repeat _10__ times. Once a day.  KNEE: Flexion - Prone   Hold pelvic press. Bend knee. Raise heel toward buttocks. Repeat on opposite leg. Do not raise hips. _10__ reps per set. When this is mastered, pull both heels up at same time, x 10 reps.  Once a day   Hip Extension (Prone)  Hold pelvic press  Lift left leg _3___ inches from floor, keeping knee locked. Repeat __10__ times per set. Do _1___ sets per session. Do _10___ sessions per day.  HIP: Extension / KNEE: Flexion - Prone    Hold pelvic press. Bend knee, squeeze glutes. Raise leg up  10___ reps per set, _2__ sets per day, _5__ days per week   Madelyn Flavors, PT 01/07/16 10:33 AM [facility] [place], [place], [address] Phone: [phone]   Fax:  [phone]

## 2016-01-10 ENCOUNTER — Ambulatory Visit: Payer: Medicare Other | Admitting: Physical Therapy

## 2016-01-11 ENCOUNTER — Ambulatory Visit (INDEPENDENT_AMBULATORY_CARE_PROVIDER_SITE_OTHER): Payer: Medicare Other | Admitting: Family

## 2016-01-11 ENCOUNTER — Encounter: Payer: Self-pay | Admitting: Family

## 2016-01-11 VITALS — BP 114/78 | HR 66 | Temp 97.9°F | Ht 63.0 in | Wt 194.0 lb

## 2016-01-11 DIAGNOSIS — J209 Acute bronchitis, unspecified: Secondary | ICD-10-CM | POA: Diagnosis not present

## 2016-01-11 MED ORDER — DOXYCYCLINE HYCLATE 100 MG PO TABS: 100.0000 mg | ORAL_TABLET | Freq: Two times a day (BID) | ORAL | 0 refills | Status: DC

## 2016-01-11 MED ORDER — BENZONATATE 200 MG PO CAPS: 200.0000 mg | ORAL_CAPSULE | Freq: Three times a day (TID) | ORAL | 1 refills | Status: DC | PRN

## 2016-01-11 MED ORDER — ALBUTEROL SULFATE HFA 108 (90 BASE) MCG/ACT IN AERS: 2.0000 | INHALATION_SPRAY | Freq: Four times a day (QID) | RESPIRATORY_TRACT | 0 refills | Status: DC | PRN

## 2016-01-11 MED ORDER — HYDROCODONE-HOMATROPINE 5-1.5 MG/5ML PO SYRP: 5.0000 mL | ORAL_SOLUTION | Freq: Three times a day (TID) | ORAL | 0 refills | Status: DC | PRN

## 2016-01-11 NOTE — Progress Notes (Signed)
   Subjective:    Patient ID: Laura James, female    DOB: Nov 26, 1950, 65 y.o.   MRN: [id number]  Cough  This is a recurrent problem. The current episode started 1 to 4 weeks ago. The problem has been waxing and waning. The problem occurs every few minutes. Associated symptoms include ear congestion, headaches, myalgias, nasal congestion, postnasal drip, a sore throat, shortness of breath and wheezing. Pertinent negatives include no ear pain or fever. The symptoms are aggravated by lying down. She has tried rest, oral steroids and OTC cough suppressant for the symptoms. The treatment provided mild relief.      Review of Systems  Constitutional: Negative for fever.  HENT: Positive for postnasal drip and sore throat. Negative for ear pain.   Respiratory: Positive for cough, shortness of breath and wheezing.   Musculoskeletal: Positive for myalgias.  Neurological: Positive for headaches.  All other systems reviewed and are negative.      Objective:   Physical Exam  Constitutional: She is oriented to person, place, and time. She appears well-developed and well-nourished. No distress.  HENT:  Head: Normocephalic and atraumatic.  Right Ear: External ear normal.  Left Ear: External ear normal.  Nose: Mucosal edema and rhinorrhea present.  Mouth/Throat: Posterior oropharyngeal erythema present.  Eyes: Pupils are equal, round, and reactive to light.  Neck: Normal range of motion. Neck supple. No thyromegaly present.  Cardiovascular: Normal rate, regular rhythm, normal heart sounds and intact distal pulses.   No murmur heard. Pulmonary/Chest: Effort normal. No respiratory distress. She has wheezes.  Abdominal: Soft. Bowel sounds are normal. She exhibits no distension. There is no tenderness.  Musculoskeletal: Normal range of motion. She exhibits no edema or tenderness.  Neurological: She is alert and oriented to person, place, and time. She has normal reflexes. No cranial nerve deficit.    Skin: Skin is warm and dry.  Psychiatric: She has a normal mood and affect. Her behavior is normal. Judgment and thought content normal.  Vitals reviewed.     BP 114/78   Pulse 66   Temp 97.9 F (36.6 C) (Oral)   Ht 5\' 3"  (1.6 m)   Wt 194 lb (88 kg)   BMI 34.37 kg/m      Assessment & Plan:  1. Acute bronchitis, unspecified organism - Take meds as prescribed - Use a cool mist humidifier  -Use saline nose sprays frequently -Saline irrigations of the nose can be very helpful if done frequently.  * 4X daily for 1 week*  * Use of a nettie pot can be helpful with this. Follow directions with this* -Force fluids -For any cough or congestion  Use plain Mucinex- regular strength or max strength is fine   * Children- consult with Pharmacist for dosing -For fever or aces or pains- take tylenol or ibuprofen appropriate for age and weight.  * for fevers greater than 101 orally you may alternate ibuprofen and tylenol every  3 hours. -Throat lozenges if help - doxycycline (VIBRA-TABS) 100 MG tablet; Take 1 tablet (100 mg total) by mouth 2 (two) times daily.  Dispense: 20 tablet; Refill: 0 - benzonatate (TESSALON) 200 MG capsule; Take 1 capsule (200 mg total) by mouth 3 (three) times daily as needed.  Dispense: 30 capsule; Refill: 1 - HYDROcodone-homatropine (HYCODAN) 5-1.5 MG/5ML syrup; Take 5 mLs by mouth every 8 (eight) hours as needed for cough.  Dispense: 120 mL; Refill: 0  Evelina Dun, FNP

## 2016-01-11 NOTE — Patient Instructions (Signed)

## 2016-01-13 ENCOUNTER — Ambulatory Visit: Payer: Medicare Other | Admitting: Physical Therapy

## 2016-01-19 ENCOUNTER — Ambulatory Visit (INDEPENDENT_AMBULATORY_CARE_PROVIDER_SITE_OTHER): Payer: Medicare Other | Admitting: *Deleted

## 2016-01-19 DIAGNOSIS — Z23 Encounter for immunization: Secondary | ICD-10-CM | POA: Diagnosis not present

## 2016-01-19 NOTE — Progress Notes (Signed)
Pt given Influenza and Prevnar vaccine Tolerated well

## 2016-01-20 ENCOUNTER — Encounter: Payer: Medicare Other | Admitting: Physical Therapy

## 2016-01-27 ENCOUNTER — Ambulatory Visit: Payer: Medicare Other | Admitting: Physical Therapy

## 2016-01-31 ENCOUNTER — Ambulatory Visit: Payer: Medicare Other | Attending: Family | Admitting: Physical Therapy

## 2016-01-31 ENCOUNTER — Encounter: Payer: Self-pay | Admitting: Physical Therapy

## 2016-01-31 DIAGNOSIS — M6281 Muscle weakness (generalized): Secondary | ICD-10-CM | POA: Diagnosis not present

## 2016-01-31 DIAGNOSIS — M5442 Lumbago with sciatica, left side: Secondary | ICD-10-CM | POA: Insufficient documentation

## 2016-01-31 DIAGNOSIS — G8929 Other chronic pain: Secondary | ICD-10-CM | POA: Diagnosis not present

## 2016-01-31 NOTE — Therapy (Signed)
[facility] [place], [place], [address] Phone: [phone]   Fax:  [phone]  Physical Therapy Treatment  Patient Details  Name: Laura James MRN: [id number] Date of Birth: 1951/01/11 Referring Provider: Evelina Dun  Encounter Date: 01/31/2016      PT End of Session - 01/31/16 1136    Visit Number 5   Number of Visits 12   Date for PT Re-Evaluation 02/07/16   PT Start Time 1116   PT Stop Time 1200   PT Time Calculation (min) 44 min   Activity Tolerance Patient tolerated treatment well   Behavior During Therapy [facility] for tasks assessed/performed      Past Medical History:  Diagnosis Date  . Barrett esophagus   . 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 Depressive disorder, not elsewhere classified   . Diverticulosis of colon (without mention of hemorrhage)   . Esophageal reflux   . Essential hypertension, benign   . Gastritis   . Grave's disease   . Hiatal hernia   . Hypothyroidism   . IBS (irritable bowel syndrome)   . Obesity   . Osteopenia   . Sleep apnea   . Symptomatic menopausal or female climacteric states     Past Surgical History:  Procedure Laterality Date  . ABDOMINAL HYSTERECTOMY    . CHOLECYSTECTOMY    . INCONTINENCE SURGERY    . TONSILLECTOMY      There were no vitals filed for this visit.      Subjective Assessment - 01/31/16 1133    Subjective Patient feels ongoing soreness and back to the same pain as before due to not doing exercises per reported   Limitations Sitting   How long can you sit comfortably? 20 minutes   How long can you stand comfortably? 15-20 minutes   Patient Stated Goals reduce pain, be able to ride in car without pain, improve strength   Currently in Pain? Yes   Pain Score 5    Pain Location Hip   Pain Orientation Left;Lower   Pain Descriptors / Indicators Sore   Pain Type Chronic pain   Pain Onset More than a month  ago   Pain Frequency Constant   Aggravating Factors  sitting driving   Pain Relieving Factors reposition                         [facility] Adult PT Treatment/Exercise - 01/31/16 0001      Moist Heat Therapy   Number Minutes Moist Heat 15 Minutes   Moist Heat Location Hip     Electrical Stimulation   Electrical Stimulation Location left SI/hip   Electrical Stimulation Action premod 80-150hz  x70min   Electrical Stimulation Goals Pain     Ultrasound   Ultrasound Location left SIJ area of pain   Ultrasound Parameters 1.5w/cm2/50%/52mhz x36min   Ultrasound Goals Pain     Manual Therapy   Manual Therapy Soft tissue mobilization   Soft tissue mobilization manual and IASTM to left SI/hip area of pain                     PT Long Term Goals - 01/07/16 1038      PT LONG TERM GOAL #1   Title Pt will be independent in HEP   Time 6   Period Weeks   Status Achieved     PT LONG TERM GOAL #2  Title Pt will improve LE strength to 4+/5 to perform standing without increased symptoms   Time 6   Period Weeks   Status On-going     PT LONG TERM GOAL #3   Title Pt will demo good form with performing core strengthening and stability exercises   Time 6   Period Weeks   Status On-going     PT LONG TERM GOAL #4   Title Pt will tolerate riding in car x 30 minutes with pain <3/10   Time 6   Period Weeks   Status On-going               Plan - 01/31/16 1137    Clinical Impression Statement Patient tolerated treatment well today with no reported pain increase. Patient had moderate palpable pain on left SI area today. Patient reported increased pain overall due to not doing HEP. Patient felt better after treatment today. Patient unable to meet any further goals due to pain deficts.    Rehab Potential Good   PT Frequency 2x / week   PT Duration 6 weeks   PT Treatment/Interventions Iontophoresis 4mg /ml Dexamethasone;Electrical Stimulation;Functional mobility  training;Therapeutic activities;Cryotherapy;Moist Heat;Ultrasound;Patient/family education;Passive range of motion;Neuromuscular re-education;Therapeutic exercise;Taping;Balance training;Manual techniques;Dry needling   PT Next Visit Plan Continue to assess leg length for rotated ilia. Continue core and SI stab exercises. Modalities prn.   Consulted and Agree with Plan of Care Patient      Patient will benefit from skilled therapeutic intervention in order to improve the following deficits and impairments:  Decreased endurance, Decreased mobility, Decreased activity tolerance, Decreased strength, Pain, Postural dysfunction, Hypomobility  Visit Diagnosis: Chronic left-sided low back pain with left-sided sciatica  Muscle weakness (generalized)     Problem List Patient Active Problem List   Diagnosis Date Noted  . Upper airway cough syndrome 12/12/2014  . Fatigue 07/08/2014  . Obstructive sleep apnea 12/23/2013  . Essential hypertension, benign   . Hiatal hernia   . Diverticulosis of colon (without mention of hemorrhage) 12/30/2010  . Gastritis, chronic 12/30/2010  . Abdominal pain 08/25/2010  . IBS (irritable bowel syndrome) 08/25/2010  . GERD (gastroesophageal reflux disease) 08/25/2010  . Diarrhea, functional 08/25/2010    Daren Yeagle P, PTA 01/31/2016, 12:02 PM  [facility] [place], [place], [address] Phone: [phone]   Fax:  [phone]  Name: Laura James MRN: [id number] Date of Birth: 1950/12/25

## 2016-02-02 ENCOUNTER — Encounter: Payer: Self-pay | Admitting: Physical Therapy

## 2016-02-02 ENCOUNTER — Ambulatory Visit: Payer: Medicare Other | Admitting: Physical Therapy

## 2016-02-02 DIAGNOSIS — M5442 Lumbago with sciatica, left side: Secondary | ICD-10-CM | POA: Diagnosis not present

## 2016-02-02 DIAGNOSIS — M6281 Muscle weakness (generalized): Secondary | ICD-10-CM | POA: Diagnosis not present

## 2016-02-02 DIAGNOSIS — G8929 Other chronic pain: Secondary | ICD-10-CM

## 2016-02-02 NOTE — Therapy (Signed)
[facility] [place], [place], [address] Phone: [phone]   Fax:  [phone]  Physical Therapy Treatment  Patient Details  Name: Laura James MRN: [id number] Date of Birth: [birth year] Referring Provider: Evelina Dun  Encounter Date: 02/02/2016      PT End of Session - 02/02/16 1115    Visit Number 6   Number of Visits 12   Date for PT Re-Evaluation 02/07/16   PT Start Time 1120   PT Stop Time 1204   PT Time Calculation (min) 44 min   Activity Tolerance Patient tolerated treatment well   Behavior During Therapy [facility] for tasks assessed/performed      Past Medical History:  Diagnosis Date  . Barrett esophagus   . 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 Depressive disorder, not elsewhere classified   . Diverticulosis of colon (without mention of hemorrhage)   . Esophageal reflux   . Essential hypertension, benign   . Gastritis   . Grave's disease   . Hiatal hernia   . Hypothyroidism   . IBS (irritable bowel syndrome)   . Obesity   . Osteopenia   . Sleep apnea   . Symptomatic menopausal or female climacteric states     Past Surgical History:  Procedure Laterality Date  . ABDOMINAL HYSTERECTOMY    . CHOLECYSTECTOMY    . INCONTINENCE SURGERY    . TONSILLECTOMY      There were no vitals filed for this visit.      Subjective Assessment - 02/02/16 1114    Subjective Reports that she has been walking and crunches and HEP exercises. Reports increased pain down LLE. Reports pain with sitting and riding in car and reports pain in L buttock.   Limitations Sitting   How long can you sit comfortably? 20 minutes   How long can you stand comfortably? 15-20 minutes   Patient Stated Goals reduce pain, be able to ride in car without pain, improve strength   Currently in Pain? Yes   Pain Score 6    Pain Location Back   Pain Orientation Left;Lower   Pain Descriptors /  Indicators Discomfort   Pain Type Chronic pain   Pain Onset More than a month ago   Aggravating Factors  Sitting, driving            [facility] PT Assessment - 02/02/16 0001      Assessment   Medical Diagnosis sciatica of left side   Next MD Visit None scheduled     Precautions   Precautions None     Restrictions   Weight Bearing Restrictions No                     [facility] Adult PT Treatment/Exercise - 02/02/16 0001      Lumbar Exercises: Stretches   Active Hamstring Stretch 3 reps;30 seconds   Active Hamstring Stretch Limitations BLE   Single Knee to Chest Stretch 3 reps;30 seconds   Single Knee to Chest Stretch Limitations BLE   Piriformis Stretch 3 reps;30 seconds   Piriformis Stretch Limitations Figure 4 techinque; BLE     Lumbar Exercises: Supine   Ab Set 20 reps;5 seconds   Bridge 20 reps;4 seconds     Modalities   Modalities Electrical Stimulation;Moist Heat     Moist Heat Therapy   Number Minutes Moist Heat 15 Minutes   Moist Heat Location Lumbar Spine;Hip     Electrical  Stimulation   Electrical Stimulation Location B lumbar paraspinals/ buttock   Electrical Stimulation Action IFC   Electrical Stimulation Parameters 1-10 hz x15 min   Electrical Stimulation Goals Pain                PT Education - 02/02/16 1134    Education provided Yes   Education Details HEP- SKTC, HS and piriformis stretch, ab set, bridging   Person(s) Educated Patient   Methods Explanation;Demonstration;Verbal cues;Handout   Comprehension Verbalized understanding;Returned demonstration;Verbal cues required             PT Long Term Goals - 01/07/16 1038      PT LONG TERM GOAL #1   Title Pt will be independent in HEP   Time 6   Period Weeks   Status Achieved     PT LONG TERM GOAL #2   Title Pt will improve LE strength to 4+/5 to perform standing without increased symptoms   Time 6   Period Weeks   Status On-going     PT LONG TERM GOAL #3   Title  Pt will demo good form with performing core strengthening and stability exercises   Time 6   Period Weeks   Status On-going     PT LONG TERM GOAL #4   Title Pt will tolerate riding in car x 30 minutes with pain <3/10   Time 6   Period Weeks   Status On-going               Plan - 02/02/16 1200    Clinical Impression Statement Patient arrived with complaints of increased pain especially in LLE region. Patient experienced L buttock tightness especially after sitting. Patient was guided through low back and hip stretches with greatest stretch noted with L Piriformis stretch in figure 4 technique and patient instructed in sitting piriformis stretch to be completed at work. Patient had no complaints of any increased pain with any of the exercises completed today. Patient provided stretches and core strengthening exercises today with patient verbalizing understanding of instruction. Normal modalities response noted following removal of the modalities. Patient also educated to stretch after walking to increase benefit.   Rehab Potential Good   PT Frequency 2x / week   PT Duration 6 weeks   PT Treatment/Interventions Iontophoresis 5m/ml Dexamethasone;Electrical Stimulation;Functional mobility training;Therapeutic activities;Cryotherapy;Moist Heat;Ultrasound;Patient/family education;Passive range of motion;Neuromuscular re-education;Therapeutic exercise;Taping;Balance training;Manual techniques;Dry needling   PT Next Visit Plan Continue to assess leg length for rotated ilia. Continue core and SI stab exercises. Modalities prn.   PT Home Exercise Plan Self MET (single leg bridge left); pelvic press series; clams, SLR flexion and extension, ab set, bridge; SKTC, Piriformis, HS stretch, ab set, and bridge   Consulted and Agree with Plan of Care Patient      Patient will benefit from skilled therapeutic intervention in order to improve the following deficits and impairments:  Decreased endurance,  Decreased mobility, Decreased activity tolerance, Decreased strength, Pain, Postural dysfunction, Hypomobility  Visit Diagnosis: Chronic left-sided low back pain with left-sided sciatica  Muscle weakness (generalized)     Problem List Patient Active Problem List   Diagnosis Date Noted  . Upper airway cough syndrome 12/12/2014  . Fatigue 07/08/2014  . Obstructive sleep apnea 12/23/2013  . Essential hypertension, benign   . Hiatal hernia   . Diverticulosis of colon (without mention of hemorrhage) 12/30/2010  . Gastritis, chronic 12/30/2010  . Abdominal pain 08/25/2010  . IBS (irritable bowel syndrome) 08/25/2010  . GERD (gastroesophageal reflux  disease) 08/25/2010  . Diarrhea, functional 08/25/2010    Wynelle Fanny, PTA 02/02/2016, 12:07 PM  [facility] [address] [place], [place], [address] Phone: [phone]   Fax:  [phone]  Name: Laura James MRN: [id number] Date of Birth: 1950-11-29

## 2016-02-02 NOTE — Patient Instructions (Addendum)
Piriformis (Supine)    Cross legs, right/left on top. Gently pull other knee toward chest until stretch is felt in buttock/hip of top leg. Hold __30__ seconds. Repeat _3___ times per set. Do ____ sets per session. Do __2-3__ sessions per day.  http://orth.exer.us/676   Copyright  VHI. All rights reserved.  Stretching: Hamstring (Supine)    Supporting right/left thigh behind knee, slowly straighten knee until stretch is felt in back of thigh. Hold __30__ seconds. Repeat __3__ times per set. Do ____ sets per session. Do _2-3___ sessions per day.  http://orth.exer.us/656   Copyright  VHI. All rights reserved.  Knee-to-Chest Stretch: Unilateral    With hand behind right/left knee, pull knee in to chest until a comfortable stretch is felt in lower back and buttocks. Keep back relaxed. Hold __30__ seconds. Repeat __3__ times per set. Do ____ sets per session. Do __2-3__ sessions per day.  http://orth.exer.us/126   Copyright  VHI. All rights reserved.  Pelvic Tilt    Flatten back by tightening stomach muscles and buttocks. Hold 5 seconds. Repeat _10___ times per set. Do __2__ sets per session. Do __2-3__ sessions per day.  http://orth.exer.us/134   Copyright  VHI. All rights reserved.  Bridging    Slowly raise buttocks from floor, keeping stomach tight. Repeat __10__ times per set. Do _2___ sets per session. Do _2-3___ sessions per day.  http://orth.exer.us/1096   Copyright  VHI. All rights reserved.

## 2016-02-04 ENCOUNTER — Other Ambulatory Visit: Payer: Self-pay | Admitting: Family Medicine

## 2016-02-07 ENCOUNTER — Encounter: Payer: Self-pay | Admitting: Physical Therapy

## 2016-02-07 ENCOUNTER — Ambulatory Visit: Payer: Medicare Other | Admitting: Physical Therapy

## 2016-02-07 DIAGNOSIS — M6281 Muscle weakness (generalized): Secondary | ICD-10-CM | POA: Diagnosis not present

## 2016-02-07 DIAGNOSIS — G8929 Other chronic pain: Secondary | ICD-10-CM | POA: Diagnosis not present

## 2016-02-07 DIAGNOSIS — M5442 Lumbago with sciatica, left side: Principal | ICD-10-CM

## 2016-02-07 NOTE — Therapy (Signed)
[facility] [place], [place], [address] Phone: [phone]   Fax:  [phone]  Physical Therapy Treatment  Patient Details  Name: Laura James MRN: [id number] Date of Birth: Jun 11, 1950 Referring Provider: Evelina Dun  Encounter Date: 02/07/2016      PT End of Session - 02/07/16 1124    Visit Number 7   Number of Visits 12   Date for PT Re-Evaluation 02/07/16   PT Start Time 1123   PT Stop Time 1202   PT Time Calculation (min) 39 min   Activity Tolerance Patient tolerated treatment well   Behavior During Therapy [facility] for tasks assessed/performed      Past Medical History:  Diagnosis Date  . Barrett esophagus   . 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 Depressive disorder, not elsewhere classified   . Diverticulosis of colon (without mention of hemorrhage)   . Esophageal reflux   . Essential hypertension, benign   . Gastritis   . Grave's disease   . Hiatal hernia   . Hypothyroidism   . IBS (irritable bowel syndrome)   . Obesity   . Osteopenia   . Sleep apnea   . Symptomatic menopausal or female climacteric states     Past Surgical History:  Procedure Laterality Date  . ABDOMINAL HYSTERECTOMY    . CHOLECYSTECTOMY    . INCONTINENCE SURGERY    . TONSILLECTOMY      There were no vitals filed for this visit.      Subjective Assessment - 02/07/16 1122    Subjective Reports that pain is still there and can tell pain with riding in the car. Reports that she had to sit all day at work and had to use a roll under L thigh to help with pain.    Limitations Sitting   How long can you sit comfortably? 20 minutes   How long can you stand comfortably? 15-20 minutes   Patient Stated Goals reduce pain, be able to ride in car without pain, improve strength   Currently in Pain? Yes   Pain Score 5    Pain Location Back   Pain Orientation Left;Lower   Pain Descriptors /  Indicators Aching;Nagging   Pain Type Chronic pain   Pain Radiating Towards L hip to knee   Pain Onset More than a month ago   Pain Frequency Rarely   Aggravating Factors  Sitting, drivng            [facility] PT Assessment - 02/07/16 0001      Assessment   Medical Diagnosis sciatica of left side   Next MD Visit None scheduled     Precautions   Precautions None     Restrictions   Weight Bearing Restrictions No                     [facility] Adult PT Treatment/Exercise - 02/07/16 0001      Lumbar Exercises: Standing   Other Standing Lumbar Exercises Seated hip abduct isometric, hip adduct isometric x10 reps     Lumbar Exercises: Supine   Bent Knee Raise 20 reps  with PPT   Bridge 15 reps;3 seconds   Bridge Limitations LLE SL bridge   Straight Leg Raise 20 reps  with PPT   Isometric Hip Flexion 10 reps;5 seconds   Isometric Hip Flexion Limitations BLE; hip flex isometric with hip ext isometric x10 reps   Other Supine Lumbar Exercises  Adductor squeeze 5 sec hold x20 reps   Other Supine Lumbar Exercises Bridge with adductor squeeze x15 reps     Lumbar Exercises: Sidelying   Clam 15 reps   Clam Limitations BLE     Lumbar Exercises: Prone   Other Prone Lumbar Exercises Pelvic press series x10 reps each  Unilateral then B knee flexion, hip extension, mule kicks                     PT Long Term Goals - 01/07/16 1038      PT LONG TERM GOAL #1   Title Pt will be independent in HEP   Time 6   Period Weeks   Status Achieved     PT LONG TERM GOAL #2   Title Pt will improve LE strength to 4+/5 to perform standing without increased symptoms   Time 6   Period Weeks   Status On-going     PT LONG TERM GOAL #3   Title Pt will demo good form with performing core strengthening and stability exercises   Time 6   Period Weeks   Status On-going     PT LONG TERM GOAL #4   Title Pt will tolerate riding in car x 30 minutes with pain <3/10   Time 6    Period Weeks   Status On-going               Plan - 02/07/16 1207    Clinical Impression Statement Patient arrived to treatment with complaints of continued low back discomfort and into L thigh and knee region but especially hip. Patient reports limitations with sitting and limitation with pain at work. Patient was taken through various MET techniques to assist with realignment of the SI/ pelvic region. Patient reported pressure but no increased pain during exercises. No modalities completed secondary to no reports of increased pain or soreness during treatment. Patient encouraged to continue HEP exercises at home.   Rehab Potential Good   PT Frequency 2x / week   PT Duration 6 weeks   PT Treatment/Interventions Iontophoresis 15m/ml Dexamethasone;Electrical Stimulation;Functional mobility training;Therapeutic activities;Cryotherapy;Moist Heat;Ultrasound;Patient/family education;Passive range of motion;Neuromuscular re-education;Therapeutic exercise;Taping;Balance training;Manual techniques;Dry needling   PT Next Visit Plan Continue to assess leg length for rotated ilia. Continue core and SI stab exercises. Modalities prn.   PT Home Exercise Plan Self MET (single leg bridge left); pelvic press series; clams, SLR flexion and extension, ab set, bridge; SKTC, Piriformis, HS stretch, ab set, and bridge   Consulted and Agree with Plan of Care Patient      Patient will benefit from skilled therapeutic intervention in order to improve the following deficits and impairments:  Decreased endurance, Decreased mobility, Decreased activity tolerance, Decreased strength, Pain, Postural dysfunction, Hypomobility  Visit Diagnosis: Chronic left-sided low back pain with left-sided sciatica  Muscle weakness (generalized)     Problem List Patient Active Problem List   Diagnosis Date Noted  . Upper airway cough syndrome 12/12/2014  . Fatigue 07/08/2014  . Obstructive sleep apnea 12/23/2013  .  Essential hypertension, benign   . Hiatal hernia   . Diverticulosis of colon (without mention of hemorrhage) 12/30/2010  . Gastritis, chronic 12/30/2010  . Abdominal pain 08/25/2010  . IBS (irritable bowel syndrome) 08/25/2010  . GERD (gastroesophageal reflux disease) 08/25/2010  . Diarrhea, functional 08/25/2010    KWynelle Fanny PTA 02/07/2016, 12:14 PM  [facility] [address] [address] [place] [address]Phone: [phone]  Fax:  [phone]  Name: AAYAT HAJJAR MRN: [id number] Date of Birth: Apr 04, 1950

## 2016-02-11 ENCOUNTER — Encounter: Payer: Medicare Other | Admitting: Physical Therapy

## 2016-02-15 ENCOUNTER — Ambulatory Visit: Payer: Medicare Other | Admitting: *Deleted

## 2016-02-15 DIAGNOSIS — M5442 Lumbago with sciatica, left side: Secondary | ICD-10-CM | POA: Diagnosis not present

## 2016-02-15 DIAGNOSIS — M6281 Muscle weakness (generalized): Secondary | ICD-10-CM | POA: Diagnosis not present

## 2016-02-15 DIAGNOSIS — G8929 Other chronic pain: Secondary | ICD-10-CM

## 2016-02-15 NOTE — Therapy (Signed)
[facility] [place], [place], [address] Phone: [phone]   Fax:  [phone]  Physical Therapy Treatment  Patient Details  Name: Laura James MRN: [id number] Date of Birth: Jul 26, 1950 Referring Provider: Evelina Dun  Encounter Date: 02/15/2016      PT End of Session - 02/15/16 1557    Visit Number 8   Number of Visits 12   Date for PT Re-Evaluation 02/07/16   PT Start Time 0945   PT Stop Time 1037   PT Time Calculation (min) 52 min      Past Medical History:  Diagnosis Date  . Barrett esophagus   . 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 Depressive disorder, not elsewhere classified   . Diverticulosis of colon (without mention of hemorrhage)   . Esophageal reflux   . Essential hypertension, benign   . Gastritis   . Grave's disease   . Hiatal hernia   . Hypothyroidism   . IBS (irritable bowel syndrome)   . Obesity   . Osteopenia   . Sleep apnea   . Symptomatic menopausal or female climacteric states     Past Surgical History:  Procedure Laterality Date  . ABDOMINAL HYSTERECTOMY    . CHOLECYSTECTOMY    . INCONTINENCE SURGERY    . TONSILLECTOMY      There were no vitals filed for this visit.      Subjective Assessment - 02/15/16 0955    Subjective Reports that pain is still there and can tell pain with riding in the car. Reports that she had to sit all day at work and had to use a roll under L thigh to help with pain.    Limitations Sitting   How long can you sit comfortably? 20 minutes   How long can you stand comfortably? 15-20 minutes   Patient Stated Goals reduce pain, be able to ride in car without pain, improve strength   Currently in Pain? Yes   Pain Score 2    Pain Location Back   Pain Orientation Left;Lower   Pain Descriptors / Indicators Aching   Pain Type Chronic pain                         [facility] Adult PT  Treatment/Exercise - 02/15/16 0001      Lumbar Exercises: Stretches   Active Hamstring Stretch 30 seconds;5 reps   Active Hamstring Stretch Limitations BLE   Piriformis Stretch 30 seconds;5 reps     Lumbar Exercises: Supine   Ab Set 20 reps;5 seconds  Drawin  needed verbal and tactile cues   Bent Knee Raise 20 reps  drawin   Bridge 15 reps;3 seconds   Other Supine Lumbar Exercises Adductor squeeze 5 sec hold x20 reps                     PT Long Term Goals - 01/07/16 1038      PT LONG TERM GOAL #1   Title Pt will be independent in HEP   Time 6   Period Weeks   Status Achieved     PT LONG TERM GOAL #2   Title Pt will improve LE strength to 4+/5 to perform standing without increased symptoms   Time 6   Period Weeks   Status On-going     PT LONG TERM GOAL #3   Title Pt will demo good form with performing  core strengthening and stability exercises   Time 6   Period Weeks   Status On-going     PT LONG TERM GOAL #4   Title Pt will tolerate riding in car x 30 minutes with pain <3/10   Time 6   Period Weeks   Status On-going               Plan - 02/15/16 1600    Clinical Impression Statement Pt did fairly well with Rx today. She arrived to clinic with lower pain levels and no signs of rotated iiium. We reviewed core activation and strengthening exs. She still needed verbal and tactile cues for technique, but did well   Rehab Potential Good   PT Frequency 2x / week   PT Duration 6 weeks   PT Treatment/Interventions Iontophoresis 60m/ml Dexamethasone;Electrical Stimulation;Functional mobility training;Therapeutic activities;Cryotherapy;Moist Heat;Ultrasound;Patient/family education;Passive range of motion;Neuromuscular re-education;Therapeutic exercise;Taping;Balance training;Manual techniques;Dry needling   PT Next Visit Plan Continue to assess leg length for rotated ilia. Continue core and SI stab exercises. Modalities prn.   PT Home Exercise Plan Self  MET (single leg bridge left); pelvic press series; clams, SLR flexion and extension, ab set, bridge; SKTC, Piriformis, HS stretch, ab set, and bridge   Consulted and Agree with Plan of Care Patient      Patient will benefit from skilled therapeutic intervention in order to improve the following deficits and impairments:  Decreased endurance, Decreased mobility, Decreased activity tolerance, Decreased strength, Pain, Postural dysfunction, Hypomobility  Visit Diagnosis: Chronic left-sided low back pain with left-sided sciatica  Muscle weakness (generalized)     Problem List Patient Active Problem List   Diagnosis Date Noted  . Upper airway cough syndrome 12/12/2014  . Fatigue 07/08/2014  . Obstructive sleep apnea 12/23/2013  . Essential hypertension, benign   . Hiatal hernia   . Diverticulosis of colon (without mention of hemorrhage) 12/30/2010  . Gastritis, chronic 12/30/2010  . Abdominal pain 08/25/2010  . IBS (irritable bowel syndrome) 08/25/2010  . GERD (gastroesophageal reflux disease) 08/25/2010  . Diarrhea, functional 08/25/2010    RAMSEUR,CHRIS, PTA 02/15/2016, 4:17 PM  [facility] [address]Phone: [phone]  Fax:  [phone] Name: Laura KIRSHNERMRN: [id number] of Birth: 707/07/1950

## 2016-02-22 ENCOUNTER — Ambulatory Visit: Payer: Medicare Other | Admitting: *Deleted

## 2016-02-22 DIAGNOSIS — M6281 Muscle weakness (generalized): Secondary | ICD-10-CM | POA: Diagnosis not present

## 2016-02-22 DIAGNOSIS — M5442 Lumbago with sciatica, left side: Secondary | ICD-10-CM | POA: Diagnosis not present

## 2016-02-22 DIAGNOSIS — G8929 Other chronic pain: Secondary | ICD-10-CM

## 2016-02-22 NOTE — Addendum Note (Signed)
Addended by: Jazilyn Siegenthaler, [place] W on: 02/22/2016 05:10 PM   Modules accepted: Orders

## 2016-02-22 NOTE — Therapy (Signed)
[facility] [place], [place], [address] Phone: [phone]   Fax:  [phone]  Physical Therapy Treatment  Patient Details  Name: Laura James MRN: [id number] Date of Birth: 18-Nov-1950 Referring Provider: Evelina Dun  Encounter Date: 02/22/2016      PT End of Session - 02/22/16 0953    Visit Number 9   Number of Visits 12   Date for PT Re-Evaluation 02/07/16   PT Start Time 0945   PT Stop Time 1030   PT Time Calculation (min) 45 min      Past Medical History:  Diagnosis Date  . Barrett esophagus   . 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 Depressive disorder, not elsewhere classified   . Diverticulosis of colon (without mention of hemorrhage)   . Esophageal reflux   . Essential hypertension, benign   . Gastritis   . Grave's disease   . Hiatal hernia   . Hypothyroidism   . IBS (irritable bowel syndrome)   . Obesity   . Osteopenia   . Sleep apnea   . Symptomatic menopausal or female climacteric states     Past Surgical History:  Procedure Laterality Date  . ABDOMINAL HYSTERECTOMY    . CHOLECYSTECTOMY    . INCONTINENCE SURGERY    . TONSILLECTOMY      There were no vitals filed for this visit.      Subjective Assessment - 02/22/16 0942    Subjective Doing better. Stiff and sore, but not pain. Sitting now is better maybe 50-60% better with the Exs   Limitations Sitting   How long can you sit comfortably? 20 minutes   How long can you stand comfortably? 15-20 minutes   Patient Stated Goals reduce pain, be able to ride in car without pain, improve strength   Currently in Pain? Yes   Pain Score 2    Pain Location Back   Pain Descriptors / Indicators Aching   Pain Onset More than a month ago                         [facility] Adult PT Treatment/Exercise - 02/22/16 0001      Lumbar Exercises: Stretches   Active Hamstring Stretch 30 seconds;5  reps   Active Hamstring Stretch Limitations BLE   Single Knee to Chest Stretch 3 reps;30 seconds   Single Knee to Chest Stretch Limitations BLE   Piriformis Stretch 30 seconds;5 reps     Lumbar Exercises: Supine   Ab Set 20 reps;5 seconds  Drawin  needed verbal and tactile cues   Bent Knee Raise 20 reps  drawin  and Lighten the load on foot on mat   Bridge 3 seconds;10 reps   Other Supine Lumbar Exercises Adductor squeeze 5 sec hold x20 reps   Other Supine Lumbar Exercises Dying Bug 2x10 with Drawin                     PT Long Term Goals - 01/07/16 1038      PT LONG TERM GOAL #1   Title Pt will be independent in HEP   Time 6   Period Weeks   Status Achieved     PT LONG TERM GOAL #2   Title Pt will improve LE strength to 4+/5 to perform standing without increased symptoms   Time 6   Period Weeks   Status On-going  PT LONG TERM GOAL #3   Title Pt will demo good form with performing core strengthening and stability exercises   Time 6   Period Weeks   Status On-going     PT LONG TERM GOAL #4   Title Pt will tolerate riding in car x 30 minutes with pain <3/10   Time 6   Period Weeks   Status On-going               Plan - 02/22/16 1021    Clinical Impression Statement Pt did great today and is feeling 50-60% better with less pain down LT LE. She is able to sit now with mainly soreness. No rotation in LT [place] noticed. Core Exs were advanced and given for Hep. Pt needed less cues today for technique and had better core activation during exs.   Rehab Potential Good   PT Frequency 2x / week   PT Duration 6 weeks   PT Treatment/Interventions Iontophoresis 70m/ml Dexamethasone;Electrical Stimulation;Functional mobility training;Therapeutic activities;Cryotherapy;Moist Heat;Ultrasound;Patient/family education;Passive range of motion;Neuromuscular re-education;Therapeutic exercise;Taping;Balance training;Manual techniques;Dry needling   PT Next Visit Plan  Continue to assess leg length for rotated ilia. Continue core and SI stab exercises. Modalities prn.  Add prone Leg raise   PT Home Exercise Plan Self MET (single leg bridge left); pelvic press series; clams, SLR flexion and extension, ab set, bridge; SKTC, Piriformis, HS stretch, ab set, and bridge   Consulted and Agree with Plan of Care Patient      Patient will benefit from skilled therapeutic intervention in order to improve the following deficits and impairments:  Decreased endurance, Decreased mobility, Decreased activity tolerance, Decreased strength, Pain, Postural dysfunction, Hypomobility  Visit Diagnosis: Chronic left-sided low back pain with left-sided sciatica  Muscle weakness (generalized)     Problem List Patient Active Problem List   Diagnosis Date Noted  . Upper airway cough syndrome 12/12/2014  . Fatigue 07/08/2014  . Obstructive sleep apnea 12/23/2013  . Essential hypertension, benign   . Hiatal hernia   . Diverticulosis of colon (without mention of hemorrhage) 12/30/2010  . Gastritis, chronic 12/30/2010  . Abdominal pain 08/25/2010  . IBS (irritable bowel syndrome) 08/25/2010  . GERD (gastroesophageal reflux disease) 08/25/2010  . Diarrhea, functional 08/25/2010    APPLEGATE, CMali PTA 02/22/2016, 2:30 PM CMaliApplegate MPT [facility] [address]Phone: [phone]  Fax:  [phone] Name: NSAFARI CINQUEMRN: [id number] of Birth: [birth year]

## 2016-02-29 ENCOUNTER — Ambulatory Visit (INDEPENDENT_AMBULATORY_CARE_PROVIDER_SITE_OTHER): Payer: Medicare Other | Admitting: *Deleted

## 2016-02-29 ENCOUNTER — Ambulatory Visit: Payer: Medicare Other | Attending: Family | Admitting: Physical Therapy

## 2016-02-29 VITALS — BP 123/68 | HR 65

## 2016-02-29 DIAGNOSIS — M5442 Lumbago with sciatica, left side: Secondary | ICD-10-CM | POA: Diagnosis not present

## 2016-02-29 DIAGNOSIS — M6281 Muscle weakness (generalized): Secondary | ICD-10-CM | POA: Insufficient documentation

## 2016-02-29 DIAGNOSIS — Z0130 Encounter for examination of blood pressure without abnormal findings: Secondary | ICD-10-CM

## 2016-02-29 DIAGNOSIS — G8929 Other chronic pain: Secondary | ICD-10-CM | POA: Diagnosis not present

## 2016-02-29 NOTE — Progress Notes (Signed)
Pt in for bp ck

## 2016-02-29 NOTE — Therapy (Signed)
[facility] [place], [place], [address] Phone: [phone]   Fax:  [phone]  Physical Therapy Treatment  Patient Details  Name: Laura James MRN: [id number]  Date of Birth: August 08, 1950 Referring Provider: Evelina Dun  Encounter Date: 03/23/2016      PT End of Session - 03/23/16 0953    Visit Number 10   Number of Visits 12   Date for PT Re-Evaluation 04/07/16      Past Medical History:  Diagnosis Date  . Barrett esophagus   . 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 Depressive disorder, not elsewhere classified   . Diverticulosis of colon (without mention of hemorrhage)   . Esophageal reflux   . Essential hypertension, benign   . Gastritis   . Grave's disease   . Hiatal hernia   . Hypothyroidism   . IBS (irritable bowel syndrome)   . Obesity   . Osteopenia   . Sleep apnea   . Symptomatic menopausal or female climacteric states     Past Surgical History:  Procedure Laterality Date  . ABDOMINAL HYSTERECTOMY    . CHOLECYSTECTOMY    . INCONTINENCE SURGERY    . TONSILLECTOMY      There were no vitals filed for this visit.      Subjective Assessment - Mar 23, 2016 1019    Subjective I was messing with my bird feeder and hurt my back yesterday.   Pain Score 4    Pain Location Back   Pain Orientation Left   Pain Descriptors / Indicators Aching   Pain Type Chronic pain   Pain Onset More than a month ago    Treatment:  Nustep level 4 x 17 minutes f/b STW/M to left SIJ region with folded pillow between knees in right sdly position x 6 minutes f/b constant pre-mod e' stim x 15 minutes to left SIJ region.  Patient did great today.                                  PT Long Term Goals - 01/07/16 1038      PT LONG TERM GOAL #1   Title Pt will be independent in HEP   Time 6   Period Weeks   Status Achieved     PT LONG TERM GOAL #2   Title Pt will improve LE strength to 4+/5 to perform standing without increased symptoms   Time 6   Period Weeks   Status On-going     PT LONG TERM GOAL #3   Title Pt will demo good form with performing core strengthening and stability exercises   Time 6   Period Weeks   Status On-going     PT LONG TERM GOAL #4   Title Pt will tolerate riding in car x 30 minutes with pain <3/10   Time 6   Period Weeks   Status On-going             Patient will benefit from skilled therapeutic intervention in order to improve the following deficits and impairments:     Visit Diagnosis: Chronic left-sided low back pain with left-sided sciatica  Muscle weakness (generalized)       G-Codes - 23-Mar-2016 0953    Functional Assessment Tool Used Clinical judgement.   Functional Limitation Changing and maintaining body position   Changing and Maintaining Body Position Current Status [phone])  At least 20 percent but less than 40 percent impaired, limited or restricted   Changing and Maintaining Body Position Goal Status [id number]) At least 1 percent but less than 20 percent impaired, limited or restricted      Problem List Patient Active Problem List   Diagnosis Date Noted  . Upper airway cough syndrome 12/12/2014  . Fatigue 07/08/2014  . Obstructive sleep apnea 12/23/2013  . Essential hypertension, benign   . Hiatal hernia   . Diverticulosis of colon (without mention of hemorrhage) 12/30/2010  . Gastritis, chronic 12/30/2010  . Abdominal pain 08/25/2010  . IBS (irritable bowel syndrome) 08/25/2010  . GERD (gastroesophageal reflux disease) 08/25/2010  . Diarrhea, functional 08/25/2010    APPLEGATE, Mali MPT 02/29/2016, 10:31 AM  [facility] [place], [place], [address] Phone: [phone]   Fax:  [phone]  Name: IZABELA LAMBO MRN: [id number] Date of Birth: Jul 03, 1950

## 2016-03-02 ENCOUNTER — Ambulatory Visit: Payer: Medicare Other | Admitting: Physical Therapy

## 2016-03-02 DIAGNOSIS — M6281 Muscle weakness (generalized): Secondary | ICD-10-CM | POA: Diagnosis not present

## 2016-03-02 DIAGNOSIS — M5442 Lumbago with sciatica, left side: Secondary | ICD-10-CM | POA: Diagnosis not present

## 2016-03-02 DIAGNOSIS — G8929 Other chronic pain: Secondary | ICD-10-CM

## 2016-03-02 NOTE — Therapy (Signed)
[facility] [place], [place], [address] Phone: [phone]   Fax:  [phone]  Physical Therapy Treatment  Patient Details  Name: Laura James MRN: [id number] Date of Birth: [birth year] Referring Provider: Evelina Dun  Encounter Date: 03/02/2016      PT End of Session - 03/02/16 1027    Visit Number 11   Date for PT Re-Evaluation 04/07/16   PT Start Time 0945   PT Stop Time 1045   PT Time Calculation (min) 60 min   Activity Tolerance Patient tolerated treatment well   Behavior During Therapy [facility] for tasks assessed/performed      Past Medical History:  Diagnosis Date  . Barrett esophagus   . 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 Depressive disorder, not elsewhere classified   . Diverticulosis of colon (without mention of hemorrhage)   . Esophageal reflux   . Essential hypertension, benign   . Gastritis   . Grave's disease   . Hiatal hernia   . Hypothyroidism   . IBS (irritable bowel syndrome)   . Obesity   . Osteopenia   . Sleep apnea   . Symptomatic menopausal or female climacteric states     Past Surgical History:  Procedure Laterality Date  . ABDOMINAL HYSTERECTOMY    . CHOLECYSTECTOMY    . INCONTINENCE SURGERY    . TONSILLECTOMY      There were no vitals filed for this visit.      Subjective Assessment - 03/02/16 0952    Subjective doing well; "whatever Mali did last week really helped."   Patient Stated Goals reduce pain, be able to ride in car without pain, improve strength   Pain Score 2    Pain Location Back   Pain Orientation Left   Pain Descriptors / Indicators Sore  stiffness   Pain Type Chronic pain   Pain Onset More than a month ago   Pain Frequency Occasional   Aggravating Factors  sitting, driving   Pain Relieving Factors repositioning                         [facility] Adult PT Treatment/Exercise - 03/02/16 0954      Lumbar Exercises: Aerobic   Stationary Bike NuStep L5x15 min     Electrical Stimulation   Electrical Stimulation Location B lumbar paraspinals/ buttock   Electrical Stimulation Action pre mod   Electrical Stimulation Parameters to tolerance   Electrical Stimulation Goals Pain     Manual Therapy   Manual Therapy Soft tissue mobilization   Soft tissue mobilization manual to left SI/hip area of pain                     PT Long Term Goals - 01/07/16 1038      PT LONG TERM GOAL #1   Title Pt will be independent in HEP   Time 6   Period Weeks   Status Achieved     PT LONG TERM GOAL #2   Title Pt will improve LE strength to 4+/5 to perform standing without increased symptoms   Time 6   Period Weeks   Status On-going     PT LONG TERM GOAL #3   Title Pt will demo good form with performing core strengthening and stability exercises   Time 6   Period Weeks   Status On-going     PT LONG TERM GOAL #4  Title Pt will tolerate riding in car x 30 minutes with pain <3/10   Time 6   Period Weeks   Status On-going               Plan - 03/02/16 1027    Clinical Impression Statement Pt tolerated session well today and reports pain significantly improved from last session.  May be ready for d/c next few visits.    PT Treatment/Interventions Iontophoresis 82m/ml Dexamethasone;Electrical Stimulation;Functional mobility training;Therapeutic activities;Cryotherapy;Moist Heat;Ultrasound;Patient/family education;Passive range of motion;Neuromuscular re-education;Therapeutic exercise;Taping;Balance training;Manual techniques;Dry needling   PT Next Visit Plan Continue to assess leg length for rotated ilia. Continue core and SI stab exercises. Modalities prn.  Add prone Leg raise   PT Home Exercise Plan Self MET (single leg bridge left); pelvic press series; clams, SLR flexion and extension, ab set, bridge; SKTC, Piriformis, HS stretch, ab set, and bridge   Consulted and Agree  with Plan of Care Patient      Patient will benefit from skilled therapeutic intervention in order to improve the following deficits and impairments:  Decreased endurance, Decreased mobility, Decreased activity tolerance, Decreased strength, Pain, Postural dysfunction, Hypomobility  Visit Diagnosis: Chronic left-sided low back pain with left-sided sciatica  Muscle weakness (generalized)     Problem List Patient Active Problem List   Diagnosis Date Noted  . Upper airway cough syndrome 12/12/2014  . Fatigue 07/08/2014  . Obstructive sleep apnea 12/23/2013  . Essential hypertension, benign   . Hiatal hernia   . Diverticulosis of colon (without mention of hemorrhage) 12/30/2010  . Gastritis, chronic 12/30/2010  . Abdominal pain 08/25/2010  . IBS (irritable bowel syndrome) 08/25/2010  . GERD (gastroesophageal reflux disease) 08/25/2010  . Diarrhea, functional 08/25/2010      Laura James PT, DPT 03/02/16 10:29 AM    [facility] [address]MR[address]het[place] [address]Phone: [phone]  Fax:  [phone] Name: Laura CONNERYMRN: [id number] of Birth: 706-17-1952

## 2016-03-05 ENCOUNTER — Other Ambulatory Visit: Payer: Self-pay | Admitting: Family Medicine

## 2016-03-09 ENCOUNTER — Ambulatory Visit: Payer: Medicare Other | Admitting: Physical Therapy

## 2016-03-09 ENCOUNTER — Encounter: Payer: Self-pay | Admitting: Physical Therapy

## 2016-03-09 DIAGNOSIS — M6281 Muscle weakness (generalized): Secondary | ICD-10-CM

## 2016-03-09 DIAGNOSIS — G8929 Other chronic pain: Secondary | ICD-10-CM

## 2016-03-09 DIAGNOSIS — M5442 Lumbago with sciatica, left side: Principal | ICD-10-CM

## 2016-03-09 NOTE — Therapy (Addendum)
[facility] [place], [place], [address] Phone: [phone]   Fax:  [phone]  Physical Therapy Treatment  Patient Details  Name: Laura James MRN: [id number] Date of Birth: 04-Mar-1950 Referring Provider: Evelina Dun  Encounter Date: 03/09/2016      PT End of Session - 03/09/16 0951    Visit Number 12   Number of Visits 12   Date for PT Re-Evaluation 04/07/16   PT Start Time 0954   PT Stop Time 1044   PT Time Calculation (min) 50 min   Activity Tolerance Patient tolerated treatment well   Behavior During Therapy [facility] [address] for tasks assessed/performed      Past Medical History:  Diagnosis Date  . Barrett esophagus   . 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 Depressive disorder, not elsewhere classified   . Diverticulosis of colon (without mention of hemorrhage)   . Esophageal reflux   . Essential hypertension, benign   . Gastritis   . Grave's disease   . Hiatal hernia   . Hypothyroidism   . IBS (irritable bowel syndrome)   . Obesity   . Osteopenia   . Sleep apnea   . Symptomatic menopausal or female climacteric states     Past Surgical History:  Procedure Laterality Date  . ABDOMINAL HYSTERECTOMY    . CHOLECYSTECTOMY    . INCONTINENCE SURGERY    . TONSILLECTOMY      There were no vitals filed for this visit.      Subjective Assessment - 03/09/16 0950    Subjective Only soreness and stiffness but no pain and no LE pain. Reports that she has seen benefit from last treatments but may see MD as the soreness and stiffness does not leave.   Limitations Sitting   How long can you sit comfortably? 20 minutes   How long can you stand comfortably? 15-20 minutes   Patient Stated Goals reduce pain, be able to ride in car without pain, improve strength   Currently in Pain? No/denies            [facility] PT Assessment - 03/09/16 0001      Assessment   Medical  Diagnosis sciatica of left side   Next MD Visit None scheduled     Precautions   Precautions None     Restrictions   Weight Bearing Restrictions No     ROM / Strength   AROM / PROM / Strength Strength     Strength   Overall Strength Within functional limits for tasks performed   Strength Assessment Site Hip   Right/Left Hip Right;Left   Right Hip Flexion 4+/5   Right Hip ABduction 4+/5   Left Hip Flexion 5/5   Left Hip ABduction 4+/5                     [facility] Adult PT Treatment/Exercise - 03/09/16 0001      Lumbar Exercises: Aerobic   Stationary Bike NuStep L4x15 min     Modalities   Modalities Electrical Stimulation;Moist Heat     Moist Heat Therapy   Number Minutes Moist Heat 15 Minutes   Moist Heat Location Lumbar Spine;Hip     Electrical Stimulation   Electrical Stimulation Location B SI joint   Electrical Stimulation Action Pre-Mod   Electrical Stimulation Parameters 80-150 hz x15 min   Electrical Stimulation Goals Pain     Manual Therapy   Manual Therapy Soft  tissue mobilization   Soft tissue mobilization STW to B SI joint and gluteals to reduce soreness and pain                     PT Long Term Goals - 03/09/16 1002      PT LONG TERM GOAL #1   Title Pt will be independent in HEP   Time 6   Period Weeks   Status Achieved     PT LONG TERM GOAL #2   Title Pt will improve LE strength to 4+/5 to perform standing without increased symptoms   Time 6   Period Weeks   Status Achieved     PT LONG TERM GOAL #3   Title Pt will demo good form with performing core strengthening and stability exercises   Time 6   Period Weeks   Status Achieved     PT LONG TERM GOAL #4   Title Pt will tolerate riding in car x 30 minutes with pain <3/10   Time 6   Period Weeks   Status Partially Met  Longest ride was 15 minutes with little pain per patient report 03/09/2016               Plan - 03/09/16 1106    Clinical Impression  Statement Patient has gone through therapy with benefits noted by patient but her worry is the continued SI soreness and stiffness she experiences. Patient able to complete NuStep without any complaints. Patient continues to experience soreness to palpation along B SI joint region per patient report. Patient's LE strength improved to 4+/5-5/5 bilaterally thus meeting goal. Patient's other goals have been met or partially achieved secondary to no long rides. Normal modalities response noted following removal of the modalities.   Rehab Potential Good   PT Frequency 2x / week   PT Duration 6 weeks   PT Treatment/Interventions Iontophoresis 21m/ml Dexamethasone;Electrical Stimulation;Functional mobility training;Therapeutic activities;Cryotherapy;Moist Heat;Ultrasound;Patient/family education;Passive range of motion;Neuromuscular re-education;Therapeutic exercise;Taping;Balance training;Manual techniques;Dry needling   PT Next Visit Plan Place chart on hold secondary to MD discretion.   PT Home Exercise Plan Self MET (single leg bridge left); pelvic press series; clams, SLR flexion and extension, ab set, bridge; SKTC, Piriformis, HS stretch, ab set, and bridge   Consulted and Agree with Plan of Care Patient      Patient will benefit from skilled therapeutic intervention in order to improve the following deficits and impairments:  Decreased endurance, Decreased mobility, Decreased activity tolerance, Decreased strength, Pain, Postural dysfunction, Hypomobility  Visit Diagnosis: Chronic left-sided low back pain with left-sided sciatica  Muscle weakness (generalized)     Problem List Patient Active Problem List   Diagnosis Date Noted  . Upper airway cough syndrome 12/12/2014  . Fatigue 07/08/2014  . Obstructive sleep apnea 12/23/2013  . Essential hypertension, benign   . Hiatal hernia   . Diverticulosis of colon (without mention of hemorrhage) 12/30/2010  . Gastritis, chronic 12/30/2010  .  Abdominal pain 08/25/2010  . IBS (irritable bowel syndrome) 08/25/2010  . GERD (gastroesophageal reflux disease) 08/25/2010  . Diarrhea, functional 08/25/2010    KAhmed Prima PTA 03/09/16 11:13 AM  [facility] [address] [address]Phone: [phone]  Fax:  [phone] Name: NJAZZMIN NEWBOLDMRN: [id number] of Birth: [birth year] PHYSICAL THERAPY DISCHARGE SUMMARY  Visits from Start of Care: 12.  Current functional level related to goals / functional outcomes: See above.   Remaining deficits: Goals essentially  met.   Education / Equipment: HEP. Plan: Patient agrees to discharge.  Patient goals were met. Patient is being discharged due to meeting the stated rehab goals.  ?????     Mali Applegate MPT

## 2016-04-27 ENCOUNTER — Ambulatory Visit: Payer: Medicare Other | Admitting: Podiatry

## 2016-05-04 ENCOUNTER — Ambulatory Visit (INDEPENDENT_AMBULATORY_CARE_PROVIDER_SITE_OTHER): Payer: Medicare Other | Admitting: Podiatry

## 2016-05-04 ENCOUNTER — Encounter: Payer: Self-pay | Admitting: Podiatry

## 2016-05-04 DIAGNOSIS — M79672 Pain in left foot: Secondary | ICD-10-CM | POA: Diagnosis not present

## 2016-05-04 DIAGNOSIS — M79671 Pain in right foot: Secondary | ICD-10-CM

## 2016-05-04 DIAGNOSIS — Q828 Other specified congenital malformations of skin: Secondary | ICD-10-CM

## 2016-05-05 NOTE — Progress Notes (Signed)
Subjective:     Patient ID: Laura James, female   DOB: 1950-05-16, 66 y.o.   MRN: [id number]  HPI 66 year old female presents the office today for concerns of possible warts to both of her feet involve the foot. This been ongoing for 2 months that she's had this previously. She saw another physician for this and she states that the areas were trimmed which helped quite a bit but she had no other treatment for this. There is a painful pressure and weightbearing. Denies any swelling or redness or any drainage or pus. She has no other complaints today.  Review of Systems  All other systems reviewed and are negative.      Objective:   Physical Exam General: AAO x3, NAD  Dermatological: Bilateral submetatarsal 2/3 hyperkeratotic lesions. Upon debridement there is no underlying ulceration, drainage or any signs of infection. There is no evidence of verruca. This appears more porokeratosis on metatarsal to with the right side worse than left. No evidence of foreign body identified either. No clinical signs of infection.  Vascular: Dorsalis Pedis artery and Posterior Tibial artery pedal pulses are 2/4 bilateral with immedate capillary fill time. Pedal hair growth present. No varicosities and no lower extremity edema present bilateral. There is no pain with calf compression, swelling, warmth, erythema.   Neruologic: Grossly intact via light touch bilateral. Vibratory intact via tuning fork bilateral. Protective threshold with Semmes Wienstein monofilament intact to all pedal sites bilateral.   Musculoskeletal: Prominent metatarsal heads plantarly mild atrophy of the fat pad. No pain, crepitus, or limitation noted with foot and ankle range of motion bilateral. Muscular strength 5/5 in all groups tested bilateral.  Gait: Unassisted, Nonantalgic.      Assessment:     Porokeratosis bilaterally    Plan:     -Treatment options discussed including all alternatives, risks, and complications -Etiology  of symptoms were discussed -Lesions are sharply debrided without complications or bleeding bilateral feet. -Metatarsal offloading pads were dispensed. -Moisturizer to the area daily. -Follow-up as needed. Call any questions or concerns meantime.  Celesta Gentile, DPM

## 2016-05-24 ENCOUNTER — Encounter: Payer: Self-pay | Admitting: Family Medicine

## 2016-05-24 ENCOUNTER — Ambulatory Visit (INDEPENDENT_AMBULATORY_CARE_PROVIDER_SITE_OTHER): Payer: Medicare Other | Admitting: Family Medicine

## 2016-05-24 VITALS — BP 116/71 | HR 91 | Temp 100.2°F | Ht 63.0 in | Wt 190.0 lb

## 2016-05-24 DIAGNOSIS — J40 Bronchitis, not specified as acute or chronic: Secondary | ICD-10-CM

## 2016-05-24 DIAGNOSIS — J329 Chronic sinusitis, unspecified: Secondary | ICD-10-CM

## 2016-05-24 MED ORDER — BETAMETHASONE SOD PHOS & ACET 6 (3-3) MG/ML IJ SUSP
6.0000 mg | Freq: Once | INTRAMUSCULAR | Status: AC
  Administered 2016-05-24: 6 mg via INTRAMUSCULAR

## 2016-05-24 MED ORDER — AMOXICILLIN-POT CLAVULANATE 875-125 MG PO TABS: 1.0000 | ORAL_TABLET | Freq: Two times a day (BID) | ORAL | 0 refills | Status: DC

## 2016-05-24 MED ORDER — PSEUDOEPHEDRINE-GUAIFENESIN ER 120-1200 MG PO TB12: 1.0000 | ORAL_TABLET | Freq: Two times a day (BID) | ORAL | 0 refills | Status: DC

## 2016-05-24 MED ORDER — HYDROCODONE-HOMATROPINE 5-1.5 MG/5ML PO SYRP: 5.0000 mL | ORAL_SOLUTION | Freq: Four times a day (QID) | ORAL | 0 refills | Status: DC | PRN

## 2016-05-24 NOTE — Progress Notes (Signed)
Subjective:  Patient ID: Laura James, female    DOB: April 03, 1950  Age: 66 y.o. MRN: [id number]  CC: Cough (pt here today c/o cough and congestion since Sunday. She is also c/o "throat raw" and ears hurt.)   HPI Laura James presents for Patient presents with upper respiratory congestion. Rhinorrhea that is frequently purulent. There is moderate sore throat. Patient reports coughing frequently as well.  yellow sputum noted. There is no fever, chills or sweats. The patient reports being short of breath. Using albuterol & benzonatate without relief Onset was 4 days ago. Gradually worsening.    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   She has a past surgical history that includes Abdominal hysterectomy; Tonsillectomy; Cholecystectomy; and Incontinence surgery.   Her family history includes Arthritis in her mother; Hypertension in her mother; Lung cancer in her father.She reports that she quit smoking about 48 years ago. Her smoking use included Cigarettes. She has a 2.50 pack-year smoking history. She has never used smokeless tobacco. She reports that she does not drink alcohol or use drugs.  Current Outpatient Prescriptions on File Prior to Visit  Medication Sig Dispense Refill  . albuterol (PROVENTIL HFA;VENTOLIN HFA) 108 (90 Base) MCG/ACT inhaler Inhale 2 puffs into the lungs every 6 (six) hours as needed for wheezing or shortness of breath. 1 Inhaler 0  . aspirin 81 MG tablet Take 81 mg by mouth daily.      Marland Kitchen atenolol (TENORMIN) 25 MG tablet TAKE 1 TABLET (25  MG TOTAL) BY MOUTH DAILY. NEEDS TO BE SEEN 30 tablet 3  . benzonatate (TESSALON) 200 MG capsule Take 1 capsule (200 mg total) by mouth 3 (three) times daily as needed. 30 capsule 1  . cetirizine (ZYRTEC) 10 MG tablet Take 10 mg by mouth daily.    . Cyanocobalamin (B-12) 3000 MCG CAPS Take 1 capsule by mouth daily.    . fluticasone (FLONASE) 50 MCG/ACT nasal spray Place 2 sprays into both nostrils daily. 16 g 6  . hydrochlorothiazide (HYDRODIURIL) 25 MG tablet TAKE 1 TABLET (25 MG TOTAL) BY MOUTH DAILY. 90 tablet 1   No current facility-administered medications on file prior to visit.     ROS Review of Systems  Constitutional: Negative for activity change, appetite change, chills and fever.  HENT: Positive for congestion, ear pain, rhinorrhea and sinus pressure. Negative for ear discharge, hearing loss, nosebleeds, postnasal drip, sneezing, sore throat and trouble swallowing.   Respiratory: Positive for cough (profuse) and shortness of breath. Negative for chest tightness.   Cardiovascular: Negative for chest pain and palpitations.  Skin: Negative for rash.    Objective:  BP 116/71   Pulse 91   Temp 100.2 F (37.9 C) (Oral)   Ht 5\' 3"  (1.6 m)   Wt 190 lb (86.2 kg)   BMI 33.66 kg/m   Physical Exam  Constitutional: She appears well-developed and well-nourished. She appears distressed.  HENT:  Head: Normocephalic and atraumatic.  Right Ear: Tympanic membrane and external ear normal. No decreased hearing is noted.  Left Ear: Tympanic membrane and external ear normal. No decreased hearing is noted.  Nose: Mucosal edema present. Right sinus exhibits no frontal sinus tenderness.  Left sinus exhibits no frontal sinus tenderness.  Mouth/Throat: No oropharyngeal exudate or posterior oropharyngeal erythema.  Neck: No Brudzinski's sign noted.  Pulmonary/Chest: Breath sounds normal. No respiratory distress.  Lymphadenopathy:       Head (right side): No preauricular adenopathy present.        Head (left side): No preauricular adenopathy present.       Right cervical: No superficial cervical adenopathy present.      Left cervical: No superficial cervical adenopathy present.    Assessment & Plan:   Laura James was seen today for cough.  Diagnoses and all orders for this visit:  Sinobronchitis -     betamethasone acetate-betamethasone sodium phosphate (CELESTONE) injection 6 mg; Inject 1 mL (6 mg total) into the muscle once.  Other orders -     amoxicillin-clavulanate (AUGMENTIN) 875-125 MG tablet; Take 1 tablet by mouth 2 (two) times daily. Take all of this medication -     HYDROcodone-homatropine (HYCODAN) 5-1.5 MG/5ML syrup; Take 5 mLs by mouth every 6 (six) hours as needed for cough. -     Pseudoephedrine-Guaifenesin [phone] MG TB12; Take 1 tablet by mouth 2 (two) times daily. For congestion   I have discontinued Ms. Rallo's omeprazole, naproxen, cyclobenzaprine, doxycycline, and HYDROcodone-homatropine. I am also having her start on amoxicillin-clavulanate, HYDROcodone-homatropine, and Pseudoephedrine-Guaifenesin. Additionally, I am having her maintain her aspirin, B-12, cetirizine, hydrochlorothiazide, fluticasone, benzonatate, albuterol, and atenolol. We will continue to administer betamethasone acetate-betamethasone sodium phosphate.  Meds ordered this encounter  Medications  . betamethasone acetate-betamethasone sodium phosphate (CELESTONE) injection 6 mg  . amoxicillin-clavulanate (AUGMENTIN) 875-125 MG tablet    Sig: Take 1 tablet by mouth 2 (two) times daily. Take all of this medication    Dispense:  20 tablet    Refill:  0  . HYDROcodone-homatropine (HYCODAN) 5-1.5 MG/5ML syrup    Sig: Take 5 mLs by mouth every 6 (six) hours as needed for cough.    Dispense:  120 mL    Refill:  0  . Pseudoephedrine-Guaifenesin [phone] MG TB12    Sig: Take 1 tablet by mouth 2 (two) times daily. For congestion    Dispense:  20 each    Refill:  0     Follow-up: Return if symptoms  worsen or fail to improve.  Claretta Fraise, M.D.

## 2016-05-30 ENCOUNTER — Telehealth: Payer: Self-pay | Admitting: Family Medicine

## 2016-05-30 NOTE — Telephone Encounter (Signed)
What symptoms do you have? Cough chest hurts tired  How long have you been sick? Since last wednesday  Have you been seen for this problem? Yes last Wednesday 05/24/2016  If your provider decides to give you a prescription, which pharmacy would you like for it to be sent to? [facility] in [place]   Patient informed that this information will be sent to the clinical staff for review and that they should receive a follow up call.

## 2016-05-31 ENCOUNTER — Emergency Department (HOSPITAL_BASED_OUTPATIENT_CLINIC_OR_DEPARTMENT_OTHER)
Admission: EM | Admit: 2016-05-31 | Discharge: 2016-05-31 | Disposition: A | Discharge status: Home / Self Care | Payer: Medicare Other | Attending: Emergency Medicine | Admitting: Emergency Medicine

## 2016-05-31 ENCOUNTER — Emergency Department (HOSPITAL_BASED_OUTPATIENT_CLINIC_OR_DEPARTMENT_OTHER): Payer: Medicare Other

## 2016-05-31 ENCOUNTER — Telehealth: Payer: Self-pay | Admitting: Family

## 2016-05-31 ENCOUNTER — Encounter (HOSPITAL_BASED_OUTPATIENT_CLINIC_OR_DEPARTMENT_OTHER): Payer: Self-pay | Admitting: Respiratory Therapy

## 2016-05-31 DIAGNOSIS — Z7982 Long term (current) use of aspirin: Secondary | ICD-10-CM | POA: Insufficient documentation

## 2016-05-31 DIAGNOSIS — Z86018 Personal history of other benign neoplasm: Secondary | ICD-10-CM | POA: Diagnosis not present

## 2016-05-31 DIAGNOSIS — E0500 Thyrotoxicosis with diffuse goiter without thyrotoxic crisis or storm: Secondary | ICD-10-CM | POA: Insufficient documentation

## 2016-05-31 DIAGNOSIS — R05 Cough: Secondary | ICD-10-CM | POA: Diagnosis not present

## 2016-05-31 DIAGNOSIS — J209 Acute bronchitis, unspecified: Secondary | ICD-10-CM | POA: Diagnosis not present

## 2016-05-31 DIAGNOSIS — Z87891 Personal history of nicotine dependence: Secondary | ICD-10-CM | POA: Insufficient documentation

## 2016-05-31 DIAGNOSIS — Z91040 Latex allergy status: Secondary | ICD-10-CM | POA: Diagnosis not present

## 2016-05-31 DIAGNOSIS — Z79899 Other long term (current) drug therapy: Secondary | ICD-10-CM | POA: Diagnosis not present

## 2016-05-31 MED ORDER — ALBUTEROL SULFATE (2.5 MG/3ML) 0.083% IN NEBU
2.5000 mg | INHALATION_SOLUTION | Freq: Once | RESPIRATORY_TRACT | Status: AC
  Administered 2016-05-31: 2.5 mg via RESPIRATORY_TRACT
  Filled 2016-05-31: qty 3

## 2016-05-31 MED ORDER — IPRATROPIUM-ALBUTEROL 0.5-2.5 (3) MG/3ML IN SOLN
3.0000 mL | RESPIRATORY_TRACT | Status: DC
  Administered 2016-05-31: 3 mL via RESPIRATORY_TRACT
  Filled 2016-05-31: qty 3

## 2016-05-31 MED ORDER — DEXAMETHASONE SODIUM PHOSPHATE 10 MG/ML IJ SOLN
10.0000 mg | Freq: Once | INTRAMUSCULAR | Status: AC
  Administered 2016-05-31: 10 mg via INTRAMUSCULAR
  Filled 2016-05-31: qty 1

## 2016-05-31 MED ORDER — DEXAMETHASONE 2 MG PO TABS: ORAL_TABLET | ORAL | 0 refills | Status: DC

## 2016-05-31 MED ORDER — HYDROCOD POLST-CPM POLST ER 10-8 MG/5ML PO SUER
5.0000 mL | Freq: Once | ORAL | Status: AC
  Administered 2016-05-31: 5 mL via ORAL
  Filled 2016-05-31: qty 5

## 2016-05-31 NOTE — Telephone Encounter (Signed)
Spoke with pt regarding RX According to ER Dr's note, Dexamethasone 10 mg dose to be repeated on Thursday Pt verbalizes understanding

## 2016-05-31 NOTE — ED Provider Notes (Addendum)
[facility] Provider Note: Laura Spurling, MD, FACEP  CSN: [id number] MRN: [id number] ARRIVAL: 05/31/16 at [place]: [place]  Cough   HISTORY [place]  Laura James is a 67 y.o. female with a one to two-week history of cough. The cough has been accompanied by nasal congestion, sinus drainage. yellow colored sputum that became blood-streaked yesterday, later becoming rust-streaked. She has had fevers high as 101 last week. She was seen by her PCP and started on Augmentin, Hycotuss, pseudoephedrine/guaifenesin and was given a shot of betamethasone. She has also been using an inhaler. Despite these her symptoms are worsening. She has had shortness of breath and worsening cough. She denies nausea, vomiting or diarrhea. She is having soreness of the right upper chest with coughing or deep breathing which she rates as a 6 out of 10.   Past Medical History:  Diagnosis Date  . Barrett esophagus   . 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 Depressive disorder, not elsewhere classified   . Diverticulosis of colon (without mention of hemorrhage)   . Esophageal reflux   . Essential hypertension, benign   . Gastritis   . Grave's disease   . Hiatal hernia   . Hypothyroidism   . IBS (irritable bowel syndrome)   . Obesity   . Osteopenia   . Sleep apnea   . Symptomatic menopausal or female climacteric states     Past Surgical History:  Procedure Laterality Date  . ABDOMINAL HYSTERECTOMY    . CHOLECYSTECTOMY    . INCONTINENCE SURGERY    . TONSILLECTOMY      Family History  Problem Relation Age of Onset  . Lung cancer Father   . Hypertension Mother   . Arthritis Mother   . Diabetes    . Clotting disorder    . Breast cancer      aunt  . Colon cancer Neg Hx     Social History  Substance Use Topics  . Smoking status: Former Smoker    Packs/day: 0.50    Years: 5.00    Types: Cigarettes    Quit  date: 01/24/1968  . Smokeless tobacco: Never Used  . Alcohol use No    Prior to Admission medications   Medication Sig Start Date End Date Taking? Authorizing Provider  albuterol (PROVENTIL HFA;VENTOLIN HFA) 108 (90 Base) MCG/ACT inhaler Inhale 2 puffs into the lungs every 6 (six) hours as needed for wheezing or shortness of breath. 01/11/16  Yes Hawks, Christy A, FNP  amoxicillin-clavulanate (AUGMENTIN) 875-125 MG tablet Take 1 tablet by mouth 2 (two) times daily. Take all of this medication 05/24/16  Yes Stacks, Cletus Gash, MD  aspirin 81 MG tablet Take 81 mg by mouth daily.     Yes [provider]  atenolol (TENORMIN) 25 MG tablet TAKE 1 TABLET (25 MG TOTAL) BY MOUTH DAILY. NEEDS TO BE SEEN 03/06/16  Yes Timmothy Euler, MD  cetirizine (ZYRTEC) 10 MG tablet Take 10 mg by mouth daily.   Yes [provider]  Cyanocobalamin (B-12) 3000 MCG CAPS Take 1 capsule by mouth daily.   Yes [provider]  fluticasone (FLONASE) 50 MCG/ACT nasal spray Place 2 sprays into both nostrils daily. 12/30/15  Yes Hawks, Christy A, FNP  hydrochlorothiazide (HYDRODIURIL) 25 MG tablet TAKE 1 TABLET (25 MG TOTAL) BY MOUTH DAILY. 12/07/15  Yes Hawks, Christy A, FNP  HYDROcodone-homatropine (HYCODAN) 5-1.5 MG/5ML syrup Take 5 mLs  by mouth every 6 (six) hours as needed for cough. 05/24/16  Yes Claretta Fraise, MD  Pseudoephedrine-Guaifenesin [phone] MG TB12 Take 1 tablet by mouth 2 (two) times daily. For congestion 05/24/16  Yes Stacks, Cletus Gash, MD    Allergies Adhesive [tape] and Latex   REVIEW OF SYSTEMS  Negative except as noted here or in the History of Present Illness.   PHYSICAL EXAMINATION  Initial Vital Signs Blood pressure (!) 148/84, pulse 89, temperature 99 F (37.2 C), temperature source Oral, resp. rate 20, height 5\' 6"  (1.676 m), weight 190 lb (86.2 kg), SpO2 98 %.  Examination General: Well-developed, well-nourished female in no acute distress; appearance consistent with age of  record HENT: normocephalic; atraumatic Eyes: pupils equal, round and reactive to light; extraocular muscles intact Neck: supple Heart: regular rate and rhythm Lungs: Expiratory wheezes right upper anterior chest; decreased sounds in the bases; raspy cough Abdomen: soft; nondistended; nontender; bowel sounds present Extremities: No deformity; full range of motion; pulses normal Neurologic: Awake, alert and oriented; motor function intact in all extremities and symmetric; no facial droop Skin: Warm and dry Psychiatric: Flat affect   RESULTS  Summary of this visit's results, reviewed by myself:   EKG Interpretation  Date/Time:    Ventricular Rate:    PR Interval:    QRS Duration:   QT Interval:    QTC Calculation:   R Axis:     Text Interpretation:        Laboratory Studies: No results found for this or any previous visit (from the past 24 hour(s)). Imaging Studies: Dg Chest 2 View  Result Date: 05/31/2016 CLINICAL DATA:  66 y/o F; 2 weeks of cough post antibiotics without improvement. Blood-tinged sputum yesterday. EXAM: CHEST  2 VIEW COMPARISON:  11/25/2013 chest radiograph. FINDINGS: Stable cardiac silhouette. Aortic atherosclerosis with calcification. No focal consolidation. No pleural effusion or pneumothorax. Bones are unremarkable. IMPRESSION: No acute pulmonary process identified. Electronically Signed   By: Kristine Garbe M.D.   On: 05/31/2016 04:21    ED COURSE  Nursing notes and initial vitals signs, including pulse oximetry, reviewed.  Vitals:   05/31/16 0400 05/31/16 0401 05/31/16 0450  BP: (!) 148/84    Pulse: 89    Resp: 20    Temp: 99 F (37.2 C)    TempSrc: Oral    SpO2: 98%  97%  Weight:  190 lb (86.2 kg)   Height:  5\' 6"  (1.676 m)    5:07 AM Air movement improved after DuoNeb and albuterol treatment. Inspiratory wheezing present but patient very tremulous due to albuterol. We will give a shot of dexamethasone and repeat the dose tomorrow.  Respiratory therapy will provide a flutter valve and teaching along with an Aerochamber for use with her inhaled.   She was advised to continue her other current medications. She was advised to contact her PCP for follow-up.  PROCEDURES    ED DIAGNOSES     ICD-9-CM ICD-10-CM   1. Acute bronchitis with bronchospasm 466.0 J20.9        Kenzi Bardwell, MD 05/31/16 0457    Shanon Rosser, MD 05/31/16 0037

## 2016-05-31 NOTE — ED Triage Notes (Signed)
Pt reports cough x 1-2 weeks. Pt saw PMD last week and was started on abx without improvement. Pt reports yellow colored sputum until last night when she noticed some blood tinged sputum

## 2016-05-31 NOTE — Procedures (Signed)
Instructed patient on the proper use of using an aerochamber via an Albuterol mdi. Patient demonstrated proper use and understood instruction. Also educated patient on the use of a flutter valve. Patient demonstrated the proper technique and understood the importance of its use. Patient tolerated well.

## 2016-05-31 NOTE — ED Notes (Signed)
ED Provider at bedside. 

## 2016-05-31 NOTE — ED Notes (Signed)
Upon entering room pt oxygen sat 85% on RA. Pt had episode of strong cough with oxygen sat immediately returning to 98%. Dr. Florina Ou and RT at bedside for re-evaluation. Pt denies feeling shob.

## 2016-06-29 ENCOUNTER — Other Ambulatory Visit: Payer: Self-pay | Admitting: Family

## 2016-07-05 ENCOUNTER — Other Ambulatory Visit: Payer: Self-pay | Admitting: Family Medicine

## 2016-08-02 ENCOUNTER — Ambulatory Visit (INDEPENDENT_AMBULATORY_CARE_PROVIDER_SITE_OTHER): Payer: Medicare Other | Admitting: *Deleted

## 2016-08-02 ENCOUNTER — Ambulatory Visit (INDEPENDENT_AMBULATORY_CARE_PROVIDER_SITE_OTHER): Payer: Medicare Other

## 2016-08-02 VITALS — BP 126/78 | HR 62 | Ht 64.5 in | Wt 194.0 lb

## 2016-08-02 DIAGNOSIS — Z0000 Encounter for general adult medical examination without abnormal findings: Secondary | ICD-10-CM | POA: Diagnosis not present

## 2016-08-02 DIAGNOSIS — Z780 Asymptomatic menopausal state: Secondary | ICD-10-CM | POA: Diagnosis not present

## 2016-08-02 NOTE — Progress Notes (Signed)
Subjective:   Laura James is a 66 y.o. female who presents for an Initial Medicare Annual Wellness Visit.  Mrs. 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  She enjoys volunteering at her church frequently, reading, and taking naps.  She lives with her husband, grand daughter, and 81 year old great grandson, whom she helps care for. Mrs. Domek has 3 children, 5 grandchildren and 1 great grandson.  She feels her health is just slightly worse than last year because she was exercising more regularly last year.  She has had one ER visit this year for acute bronchitis.  She has had no hospitalizations or surgeries this year.    Review of Systems    Patient complains of left foot pain 5/10 today.  States she wore high heels yesterday and this aggravated her bunion and hammer toe.  All other systems negative        Objective:    Today's Vitals   08/02/16 0912  BP: 126/78  Pulse: 62  Weight: 194 lb (88 kg)  Height: 5' 4.5" (1.638 m)  PainSc: 5   PainLoc: Foot   Body mass index is 32.79 kg/m.   Current Medications (verified) Outpatient Encounter Prescriptions as of 08/02/2016  Medication Sig  . albuterol (PROVENTIL HFA;VENTOLIN HFA) 108 (90 Base) MCG/ACT inhaler Inhale 2 puffs into the lungs every 6 (six) hours as needed for wheezing or shortness of breath.  Marland Kitchen aspirin 81 MG tablet Take 81 mg by mouth daily.    Marland Kitchen atenolol (TENORMIN) 25 MG tablet TAKE 1 TABLET (25 MG TOTAL) BY MOUTH DAILY. NEEDS TO BE SEEN  . cetirizine (ZYRTEC) 10 MG tablet Take 10 mg by mouth daily as needed.   . Cyanocobalamin (B-12) 3000 MCG CAPS Take 1 capsule by mouth daily.  . fluticasone (FLONASE) 50 MCG/ACT nasal spray Place 2 sprays into both nostrils daily. (Patient taking differently: Place 2 sprays into both nostrils daily as needed. )  . hydrochlorothiazide (HYDRODIURIL) 25 MG tablet TAKE 1  TABLET (25 MG TOTAL) BY MOUTH DAILY.  Marland Kitchen HYDROcodone-homatropine (HYCODAN) 5-1.5 MG/5ML syrup Take 5 mLs by mouth every 6 (six) hours as needed for cough. (Patient not taking: Reported on 08/02/2016)  . Pseudoephedrine-Guaifenesin [id number] MG TB12 Take 1 tablet by mouth 2 (two) times daily. For congestion (Patient not taking: Reported on 08/02/2016)  . [DISCONTINUED] amoxicillin-clavulanate (AUGMENTIN) 875-125 MG tablet Take 1 tablet by mouth 2 (two) times daily. Take all of this medication (Patient not taking: Reported on 08/02/2016)  . [DISCONTINUED] dexamethasone (DECADRON) 2 MG tablet Takeoff 5 tablets on Thursday, 06/01/2016. (Patient not taking: Reported on 08/02/2016)   No facility-administered encounter medications on file as of 08/02/2016.     Allergies (verified) Adhesive [tape] and Latex   History: Past Medical History:  Diagnosis Date  . Barrett esophagus   . 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 Depressive disorder, not elsewhere classified   . Diverticulosis of colon (without mention of hemorrhage)   . Esophageal reflux   . Essential hypertension, benign   . Gastritis   . Grave's disease   . Hiatal hernia   . Hypothyroidism   . IBS (irritable bowel syndrome)   . Obesity   . Osteopenia   . Sleep apnea   . Symptomatic menopausal or female climacteric states  Past Surgical History:  Procedure Laterality Date  . ABDOMINAL HYSTERECTOMY    . CHOLECYSTECTOMY    . INCONTINENCE SURGERY    . TONSILLECTOMY     Family History  Problem Relation Age of Onset  . Lung cancer Father   . Hypertension Mother   . Arthritis Mother   . Diabetes Unknown   . Clotting disorder Unknown   . Breast cancer Unknown        aunt  . Colon cancer Neg Hx    Social History   Occupational History  . [place] History Main Topics  . Smoking status: Former Smoker    Packs/day: 0.50    Years: 5.00    Types:  Cigarettes    Quit date: 01/24/1968  . Smokeless tobacco: Never Used  . Alcohol use No  . Drug use: No  . Sexual activity: Not on file      Activities of Daily Living In your present state of health, do you have any difficulty performing the following activities: 08/02/2016  Hearing? N  Vision? N  Difficulty concentrating or making decisions? N  Walking or climbing stairs? N  Dressing or bathing? N  Doing errands, shopping? N  Some recent data might be hidden    Immunizations and Health Maintenance Immunization History  Administered Date(s) Administered  . Influenza, High Dose Seasonal PF 01/19/2016  . Influenza-Unspecified 11/23/2013  . Pneumococcal Conjugate-13 01/19/2016  . Tdap 12/23/2013  . Zoster 06/28/2015      Health Maintenance Due  Topic Date Due  . Hepatitis C Screening  1950/08/06  . DEXA SCAN  08/20/2013   Dexa scan done today Recommend Hep C screening at appointment with Evelina Dun 10/12/16   Patient Care Team: Sharion Balloon, FNP as PCP - General (Family Medicine)  Indicate any recent Medical Services you may have received from other than [facility] providers in the past year (date may be approximate). [facility]- vision exam    Assessment:   This is a routine wellness examination for [place].   Hearing/Vision screen Goes to [facility] screen[facility].  She has been seen within the past year there.  She has cataracts, was told they will likely need to be removed within the next year at her last eye exam.  No hearing deficits noted  Dietary issues and exercise activities discussed:  She describes a varied diet that is different on the days she is working than on her off days.  She usually has 3 regular meals per day and 1 snack.   Encouraged a diet including mostly vegetables, fruits, whole grains, and lean protein Encouraged grilling, broiling, baking, or sauteing rather than frying foods  Current Exercise Habits: The  patient does not participate in regular exercise at present, Exercise limited by: None identified  Goals    . Exercise 3x per week (30 min per time)          Increase walking to 3 times per week for 30 minutes each session      Depression Screen PHQ 2/9 Scores 08/02/2016 05/24/2016 01/11/2016 12/30/2015 11/26/2015 10/29/2015 06/28/2015  PHQ - 2 Score 0 0 0 0 0 0 0    Fall Risk Fall Risk  08/02/2016 05/24/2016 01/11/2016 12/30/2015 11/26/2015  Falls in the past year? No No No No No    Cognitive Function: MMSE - Mini Mental State Exam 08/02/2016  Orientation to time 5  Orientation to Place 5  Registration 3  Attention/ Calculation 5  Recall 3  Language- name 2 objects 2  Language- repeat 1  Language- follow 3 step command 3  Language- read & follow direction 1  Write a sentence 1  Copy design 1  Total score 30        Screening Tests Health Maintenance  Topic Date Due  . Hepatitis C Screening  01-13-1951  . DEXA SCAN  08/20/2013  . PAP SMEAR  12/29/2016 (Originally 12/24/2015)  . INFLUENZA VACCINE  08/23/2016  . PNA vac Low Risk Adult (2 of 2 - PPSV23) 01/18/2017  . MAMMOGRAM  10/20/2017  . COLONOSCOPY  11/29/2020  . TETANUS/TDAP  12/24/2023   Hep C recommended at visit with Evelina Dun, FNP on 10/12/16 Dexa scan completed today Mammogram scheduled for 11/16/16    Plan:    Work on increasing  exercise to walking 30 minutes 3 times per week  Follow up appointment with Evelina Dun, FNP on 10/12/16 at 11:10 am  Have mammogram 11/16/16 at 1:50 pm here at [facility] on the mobile unit  If you decide to complete Advanced Directives please provide our office with a copy so that we may file in your medical record.   You will be due for the pneumovax 23 vaccine after 01/18/2017  Consider getting the new Shingles vaccine called Shingrix within the next year    I have personally reviewed and noted the following in the patient's chart:   . Medical and social history . Use of  alcohol, tobacco or illicit drugs  . Current medications and supplements . Functional ability and status . Nutritional status . Physical activity . Advanced directives . List of other physicians . Hospitalizations, surgeries, and ER visits in previous 12 months . Vitals . Screenings to include cognitive, depression, and falls . Referrals and appointments  In addition, I have reviewed and discussed with patient certain preventive protocols, quality metrics, and best practice recommendations. A written personalized care plan for preventive services as well as general preventive health recommendations were provided to patient.     Cilicia Borden M, RN   08/02/2016    I have reviewed and agree with the above AWV documentation.   Evelina Dun, FNP

## 2016-08-02 NOTE — Patient Instructions (Addendum)
Please work on increasing your exercise to walking 30 minutes 3 times per week  You have been scheduled for a follow up appointment with Evelina Dun, FNP on 10/12/16 at 11:10 am  Your mammogram appointment has been scheduled for 11/16/16 at 1:50 pm here at [facility] on the mobile unit  If you decide to complete Advanced Directives please provide our office with a copy so that we may file in your medical record.   You will be due for the pneumovax 23 vaccine after 01/18/2017  Consider getting the new Shingles vaccine called Shingrix within the next year  Thank you for coming in for your Annual Wellness Visit today!      Preventive Care 66 Years and Older, Female Preventive care refers to lifestyle choices and visits with your health care provider that can promote health and wellness. What does preventive care include?  A yearly physical exam. This is also called an annual well check.  Dental exams once or twice a year.  Routine eye exams. Ask your health care provider how often you should have your eyes checked.  Personal lifestyle choices, including: ? Daily care of your teeth and gums. ? Regular physical activity. ? Eating a healthy diet. ? Avoiding tobacco and drug use. ? Limiting alcohol use. ? Practicing safe sex. ? Taking low-dose aspirin every day. ? Taking vitamin and mineral supplements as recommended by your health care provider. What happens during an annual well check? The services and screenings done by your health care provider during your annual well check will depend on your age, overall health, lifestyle risk factors, and family history of disease. Counseling Your health care provider may ask you questions about your:  Alcohol use.  Tobacco use.  Drug use.  Emotional well-being.  Home and relationship well-being.  Sexual activity.  Eating habits.  History of falls.  Memory and ability to understand (cognition).  Work and work  Statistician.  Reproductive health.  Screening You may have the following tests or measurements:  Height, weight, and BMI.  Blood pressure.  Lipid and cholesterol levels. These may be checked every 5 years, or more frequently if you are over 52 years old.  Skin check.  Lung cancer screening. You may have this screening every year starting at age 51 if you have a 30-pack-year history of smoking and currently smoke or have quit within the past 15 years.  Fecal occult blood test (FOBT) of the stool. You may have this test every year starting at age 11.  Flexible sigmoidoscopy or colonoscopy. You may have a sigmoidoscopy every 5 years or a colonoscopy every 10 years starting at age 63.  Hepatitis C blood test.  Hepatitis B blood test.  Sexually transmitted disease (STD) testing.  Diabetes screening. This is done by checking your blood sugar (glucose) after you have not eaten for a while (fasting). You may have this done every 1-3 years.  Bone density scan. This is done to screen for osteoporosis. You may have this done starting at age 51.  Mammogram. This may be done every 1-2 years. Talk to your health care provider about how often you should have regular mammograms.  Talk with your health care provider about your test results, treatment options, and if necessary, the need for more tests. Vaccines Your health care provider may recommend certain vaccines, such as:  Influenza vaccine. This is recommended every year.  Tetanus, diphtheria, and acellular pertussis (Tdap, Td) vaccine. You may need a Td booster every 10 years.  Varicella vaccine. You may need this if you have not been vaccinated.  Zoster vaccine. You may need this after age 16.  Measles, mumps, and rubella (MMR) vaccine. You may need at least one dose of MMR if you were born in 1957 or later. You may also need a second dose.  Pneumococcal 13-valent conjugate (PCV13) vaccine. One dose is recommended after age  80.  Pneumococcal polysaccharide (PPSV23) vaccine. One dose is recommended after age 69.  Meningococcal vaccine. You may need this if you have certain conditions.  Hepatitis A vaccine. You may need this if you have certain conditions or if you travel or work in places where you may be exposed to hepatitis A.  Hepatitis B vaccine. You may need this if you have certain conditions or if you travel or work in places where you may be exposed to hepatitis B.  Haemophilus influenzae type b (Hib) vaccine. You may need this if you have certain conditions.  Talk to your health care provider about which screenings and vaccines you need and how often you need them. This information is not intended to replace advice given to you by your health care provider. Make sure you discuss any questions you have with your health care provider. Document Released: 02/05/2015 Document Revised: 09/29/2015 Document Reviewed: 11/10/2014 Elsevier Interactive Patient Education  2017 [employer].

## 2016-08-11 ENCOUNTER — Ambulatory Visit (INDEPENDENT_AMBULATORY_CARE_PROVIDER_SITE_OTHER): Payer: Medicare Other | Admitting: Family

## 2016-08-11 ENCOUNTER — Encounter: Payer: Self-pay | Admitting: Family

## 2016-08-11 VITALS — BP 117/71 | HR 62 | Temp 98.0°F | Ht 64.5 in | Wt 193.8 lb

## 2016-08-11 DIAGNOSIS — R079 Chest pain, unspecified: Secondary | ICD-10-CM

## 2016-08-11 DIAGNOSIS — R5383 Other fatigue: Secondary | ICD-10-CM

## 2016-08-11 DIAGNOSIS — R0602 Shortness of breath: Secondary | ICD-10-CM | POA: Diagnosis not present

## 2016-08-11 DIAGNOSIS — R609 Edema, unspecified: Secondary | ICD-10-CM | POA: Diagnosis not present

## 2016-08-11 LAB — CMP14+EGFR
A/G RATIO: 1.4 (ref 1.2–2.2)
ALK PHOS: 75 IU/L (ref 39–117)
ALT: 14 IU/L (ref 0–32)
AST: 22 IU/L (ref 0–40)
Albumin: 4 g/dL (ref 3.6–4.8)
BILIRUBIN TOTAL: 0.2 mg/dL (ref 0.0–1.2)
BUN/Creatinine Ratio: 14 (ref 12–28)
BUN: 15 mg/dL (ref 8–27)
CHLORIDE: 100 mmol/L (ref 96–106)
CO2: 27 mmol/L (ref 20–29)
Calcium: 10 mg/dL (ref 8.7–10.3)
Creatinine, Ser: 1.11 mg/dL — ABNORMAL HIGH (ref 0.57–1.00)
GFR calc non Af Amer: 52 mL/min/{1.73_m2} — ABNORMAL LOW (ref >59)
GFR, EST AFRICAN AMERICAN: 59 mL/min/{1.73_m2} — AB (ref >59)
GLOBULIN, TOTAL: 2.9 g/dL (ref 1.5–4.5)
Glucose: 82 mg/dL (ref 65–99)
Potassium: 4.1 mmol/L (ref 3.5–5.2)
SODIUM: 141 mmol/L (ref 134–144)
Total Protein: 6.9 g/dL (ref 6.0–8.5)

## 2016-08-11 NOTE — Patient Instructions (Signed)

## 2016-08-11 NOTE — Progress Notes (Signed)
   Subjective:    Patient ID: Laura James, female    DOB: 31-Aug-1950, 66 y.o.   MRN: [id number]  Pt presents to the office today peripheral edema in BLE that started 3 months ago. Pt states by the end of the day her ankles are extremely large. Pt states the swelling decreases after elevating her feet.   She is also complaining of extreme fatigue that started two months ago and this becoming worse. Pt complaining of right chest pain that is dull. Pt states she saw a Cardiologists two years.  Chest Pain   The current episode started more than 1 month ago. The onset quality is gradual. The problem occurs intermittently. The problem has been waxing and waning. The pain is present in the substernal region. The pain is at a severity of 5/10. The pain is mild. The quality of the pain is described as dull. The pain does not radiate. Associated symptoms include malaise/fatigue. Pertinent negatives include no back pain, irregular heartbeat, numbness or vomiting. The pain is aggravated by emotional upset. She has tried rest for the symptoms. The treatment provided no relief.     Review of Systems  Constitutional: Positive for malaise/fatigue.  Cardiovascular: Positive for chest pain.  Gastrointestinal: Negative for vomiting.  Musculoskeletal: Negative for back pain.  Neurological: Negative for numbness.  All other systems reviewed and are negative.      Objective:   Physical Exam  Constitutional: She is oriented to person, place, and time. She appears well-developed and well-nourished. No distress.  HENT:  Head: Normocephalic and atraumatic.  Right Ear: External ear normal.  Left Ear: External ear normal.  Nose: Nose normal.  Mouth/Throat: Oropharynx is clear and moist.  Eyes: Pupils are equal, round, and reactive to light.  Neck: Normal range of motion. Neck supple. No thyromegaly present.  Cardiovascular: Normal rate, regular rhythm, normal heart sounds and intact distal pulses.   No murmur  heard. Pulmonary/Chest: Effort normal and breath sounds normal. No respiratory distress. She has no wheezes.  Abdominal: Soft. Bowel sounds are normal. She exhibits no distension. There is no tenderness.  Musculoskeletal: Normal range of motion. She exhibits edema (trace in BLE). She exhibits no tenderness.  Neurological: She is alert and oriented to person, place, and time.  Skin: Skin is warm and dry.  Psychiatric: She has a normal mood and affect. Her behavior is normal. Judgment and thought content normal.  Vitals reviewed.    BP 117/71   Pulse 62   Temp 98 F (36.7 C) (Oral)   Ht 5' 4.5" (1.638 m)   Wt 193 lb 12.8 oz (87.9 kg)   BMI 32.75 kg/m      Assessment & Plan:  1. Chest pain, unspecified type - EKG 12-Lead - Brain natriuretic peptide - CMP14+EGFR - Ambulatory referral to Cardiology  2. Other fatigue - EKG 12-Lead - Brain natriuretic peptide - CMP14+EGFR - Ambulatory referral to Cardiology  3. Peripheral edema -Low salt diet Compression hose Keep feet elevated - Brain natriuretic peptide - CMP14+EGFR - Ambulatory referral to Cardiology - Compression stockings   Referral to Cardiologists  For risk factors Labs pending   Evelina Dun, FNP

## 2016-08-12 LAB — BRAIN NATRIURETIC PEPTIDE: BNP: <2.5 pg/mL (ref 0.0–100.0)

## 2016-09-06 ENCOUNTER — Encounter: Payer: Self-pay | Admitting: Pharmacist

## 2016-09-06 ENCOUNTER — Ambulatory Visit (INDEPENDENT_AMBULATORY_CARE_PROVIDER_SITE_OTHER): Payer: Medicare Other | Admitting: Pharmacist

## 2016-09-06 ENCOUNTER — Ambulatory Visit: Payer: Medicare Other | Admitting: Pharmacist

## 2016-09-06 DIAGNOSIS — M8589 Other specified disorders of bone density and structure, multiple sites: Secondary | ICD-10-CM | POA: Diagnosis not present

## 2016-09-06 DIAGNOSIS — M8580 Other specified disorders of bone density and structure, unspecified site: Secondary | ICD-10-CM | POA: Insufficient documentation

## 2016-09-06 MED ORDER — VITAMIN D 1000 UNITS PO TABS: 1000.0000 [IU] | ORAL_TABLET | Freq: Every day | ORAL | Status: AC

## 2016-09-06 NOTE — Progress Notes (Signed)
Patient ID: Laura James, female   DOB: [birth year], 66 y.o.   MRN: [id number]   HPI: Patient here today to review DEXA results from 08/02/2016.  She c/o bilateral hip pain more on left than right side.  She is taking IBU 200 to 400mg  up to every day in am and pm.  She would like referral to orthopedist.  Prior fracture?  No Med(s) for Osteoporosis/Osteopenia:  none Med(s) previously tried for Osteoporosis/Osteopenia:  none                                                             PMH: Age at menopause:  Surgical - age 8's Hysterectomy?  Yes Oophorectomy?  No HRT? No Steroid Use?  No Thyroid med?  No History of cancer?  No History of digestive disorders (ie Crohn's)?  Yes - GERD, gastritic, barrett's esophagus, IBS Current or previous eating disorders?  No Last Vitamin D Result:  27.7 (12/23/2013) Last GFR Result:  59 (08/11/2016)   FH/SH: Family history of osteoporosis?  Yes  - mother Parent with history of hip fracture?  No Family history of breast cancer?  No Exercise?  No Smoking?  No Alcohol?  No    Calcium Assessment Calcium Intake  # of servings/day  Calcium mg  Milk (8 oz) 0  x  300  = 0  Yogurt (4 oz) 0 x  200 = 0  Cheese (1 oz) 1 x  200 = 200mg   Other Calcium sources   250mg   Ca supplement 0 = 0   Estimated calcium intake per day 450mg     DEXA Results Date of Test T-Score for AP Spine L1-L4 T-Score for Neck of Right Hip  08/02/2016 -1.1 -2.2               FRAX 10 year estimate: Total FX risk:  5.3%  (consider medication if >/= 20%) Hip FX risk:  1.0%  (consider medication if >/= 3%)  Assessment: Osteopenia with low FRAX score Low vitamin D Bilateral hip pain  Recommendations: 1.  Start  Vitamin in D 1000IU qd 2.  recommend calcium 1200mg  daily through supplementation or diet.  3.  recommend weight bearing exercise - 30 minutes at least 4 days per week.   4.  Counseled and educated about fall risk and prevention. 5.  Will discuss request for  referral to orthopedist with pt's PCP 6.  Recommended she not increase IBU intake at this time - instead try APAP 500mg  1 tablet q8h as needed  Recheck DEXA:  2 years  Time spent counseling patient:  40 minutes

## 2016-09-06 NOTE — Patient Instructions (Signed)
Exercise for Strong Bones  Exercise is important to build and maintain strong bones / bone density.  There are 2 types of exercises that are important to building and maintaining strong bones:  Weight- bearing and muscle-stregthening.  Weight-bearing Exercises  These exercises include activities that make you move against gravity while staying upright. Weight-bearing exercises can be high-impact or low-impact.  High-impact weight-bearing exercises help build bones and keep them strong. If you have broken a bone due to osteoporosis or are at risk of breaking a bone, you may need to avoid high-impact exercises. If you're not sure, you should check with your healthcare provider.  Examples of high-impact weight-bearing exercises are: Dancing  Doing high-impact aerobics  Hiking  Jogging/running  Jumping Rope  Stair climbing  Tennis  Low-impact weight-bearing exercises can also help keep bones strong and are a safe alternative if you cannot do high-impact exercises.   Examples of low-impact weight-bearing exercises are: Using elliptical training machines  Doing low-impact aerobics  Using stair-step machines  Fast walking on a treadmill or outside   Muscle-Strengthening Exercises These exercises include activities where you move your body, a weight or some other resistance against gravity. They are also known as resistance exercises and include: Lifting weights  Using elastic exercise bands  Using weight machines  Lifting your own body weight  Functional movements, such as standing and rising up on your toes  Yoga and Pilates can also improve strength, balance and flexibility. However, certain positions may not be safe for people with osteoporosis or those at increased risk of broken bones. For example, exercises that have you bend forward may increase the chance of breaking a bone in the spine.   Non-Impact Exercises There are other types of exercises that can help  prevent falls.  Non-impact exercises can help you to improve balance, posture and how well you move in everyday activities. Some of these exercises include: Balance exercises that strengthen your legs and test your balance, such as Tai Chi, can decrease your risk of falls.  Posture exercises that improve your posture and reduce rounded or "sloping" shoulders can help you decrease the chance of breaking a bone, especially in the spine.  Functional exercises that improve how well you move can help you with everyday activities and decrease your chance of falling and breaking a bone. For example, if you have trouble getting up from a chair or climbing stairs, you should do these activities as exercises.   **A physical therapist can teach you balance, posture and functional exercises. He/she can also help you learn which exercises are safe and appropriate for you.  Laura James has a physical therapy office in [place] in front of our office and referrals can be made for assessments and treatment as needed and strength and balance training.  If you would like to have an assessment with Chad and our physical therapy team please let a nurse or provider know.   Fall Prevention in the Home Falls can cause injuries and can affect people from all age groups. There are many simple things that you can do to make your home safe and to help prevent falls. What can I do on the outside of my home?  Regularly repair the edges of walkways and driveways and fix any cracks.  Remove high doorway thresholds.  Trim any shrubbery on the main path into your home.  Use bright outdoor lighting.  Clear walkways of debris and clutter, including tools and rocks.  Regularly check that handrails   are securely fastened and in good repair. Both sides of any steps should have handrails.  Install guardrails along the edges of any raised decks or porches.  Have leaves, snow, and ice cleared regularly.  Use sand or salt on  walkways during winter months.  In the garage, clean up any spills right away, including grease or oil spills. What can I do in the bathroom?  Use night lights.  Install grab bars by the toilet and in the tub and shower. Do not use towel bars as grab bars.  Use non-skid mats or decals on the floor of the tub or shower.  If you need to sit down while you are in the shower, use a plastic, non-slip stool.  Keep the floor dry. Immediately clean up any water that spills on the floor.  Remove soap buildup in the tub or shower on a regular basis.  Attach bath mats securely with double-sided non-slip rug tape.  Remove throw rugs and other tripping hazards from the floor. What can I do in the bedroom?  Use night lights.  Make sure that a bedside light is easy to reach.  Do not use oversized bedding that drapes onto the floor.  Have a firm chair that has side arms to use for getting dressed.  Remove throw rugs and other tripping hazards from the floor. What can I do in the kitchen?  Clean up any spills right away.  Avoid walking on wet floors.  Place frequently used items in easy-to-reach places.  If you need to reach for something above you, use a sturdy step stool that has a grab bar.  Keep electrical cables out of the way.  Do not use floor polish or wax that makes floors slippery. If you have to use wax, make sure that it is non-skid floor wax.  Remove throw rugs and other tripping hazards from the floor. What can I do in the stairways?  Do not leave any items on the stairs.  Make sure that there are handrails on both sides of the stairs. Fix handrails that are broken or loose. Make sure that handrails are as long as the stairways.  Check any carpeting to make sure that it is firmly attached to the stairs. Fix any carpet that is loose or worn.  Avoid having throw rugs at the top or bottom of stairways, or secure the rugs with carpet tape to prevent them from  moving.  Make sure that you have a light switch at the top of the stairs and the bottom of the stairs. If you do not have them, have them installed. What are some other fall prevention tips?  Wear closed-toe shoes that fit well and support your feet. Wear shoes that have rubber soles or low heels.  When you use a stepladder, make sure that it is completely opened and that the sides are firmly locked. Have someone hold the ladder while you are using it. Do not climb a closed stepladder.  Add color or contrast paint or tape to grab bars and handrails in your home. Place contrasting color strips on the first and last steps.  Use mobility aids as needed, such as canes, walkers, scooters, and crutches.  Turn on lights if it is dark. Replace any light bulbs that burn out.  Set up furniture so that there are clear paths. Keep the furniture in the same spot.  Fix any uneven floor surfaces.  Choose a carpet design that does not hide the edge  of steps of a stairway.  Be aware of any and all pets.  Review your medicines with your healthcare provider. Some medicines can cause dizziness or changes in blood pressure, which increase your risk of falling. Talk with your health care provider about other ways that you can decrease your risk of falls. This may include working with a physical therapist or trainer to improve your strength, balance, and endurance. This information is not intended to replace advice given to you by your health care provider. Make sure you discuss any questions you have with your health care provider. Document Released: 12/30/2001 Document Revised: 06/08/2015 Document Reviewed: 02/13/2014 Elsevier Interactive Patient Education  2017 [employer].

## 2016-09-07 NOTE — Progress Notes (Signed)
Cardiology Office Note  Date: 09/08/2016   ID: Laura James, DOB [birth year], MRN [id number]  PCP: Sharion Balloon, FNP  Consulting Cardiologist: Rozann Lesches, MD   Chief Complaint  Patient presents with  . Fatigue  . Dyspnea on exertion    History of Present Illness: Laura James is a 66 y.o. female referred back to the office by [name] for evaluation of leg fatigue, dyspnea on exertion, and intermittent ankle swelling. This is my first meeting with her, she was last seen by Dr. Percival Spanish in 2016 and had undergone previous cardiac testing in 2014 as summarized below. Her husband is a patient of mine.  She states that over the last 3 months she has been fatigued and short of breath with activity, not specifically with exertional chest pain, but she does have intermittent chest discomfort which she characterizes as indigestion. Also feeling of dysphagia. She states that she had been walking on the treadmill for exercise but has not done this in the last few months. She has had no palpitations or syncope. No orthopnea or PND.  Also has intermittent ankle swelling, no obvious precipitant.  I reviewed her most recent ECG in July which is outlined below and overall nonspecific.  Past Medical History:  Diagnosis Date  . Barrett esophagus   . 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 Depression   . Diverticulosis of colon (without mention of hemorrhage)   . Esophageal reflux   . Essential hypertension, benign   . Gastritis   . Grave's disease   . Hiatal hernia   . Hypothyroidism   . IBS (irritable bowel syndrome)   . Obesity   . Osteopenia   . Sleep apnea   . Symptomatic menopausal or female climacteric states     Past Surgical History:  Procedure Laterality Date  . ABDOMINAL HYSTERECTOMY N/A   . CHOLECYSTECTOMY    . INCONTINENCE SURGERY    . TONSILLECTOMY      Current Outpatient Prescriptions  Medication Sig Dispense  Refill  . albuterol (PROVENTIL HFA;VENTOLIN HFA) 108 (90 Base) MCG/ACT inhaler Inhale 2 puffs into the lungs every 6 (six) hours as needed for wheezing or shortness of breath. 1 Inhaler 0  . aspirin 81 MG tablet Take 81 mg by mouth daily.      Marland Kitchen atenolol (TENORMIN) 25 MG tablet TAKE 1 TABLET (25 MG TOTAL) BY MOUTH DAILY. NEEDS TO BE SEEN (Patient taking differently: TAKE 1 TABLET (25 MG TOTAL) BY MOUTH DAILY) 30 tablet 4  . cetirizine (ZYRTEC) 10 MG tablet Take 10 mg by mouth daily as needed.     . cholecalciferol (VITAMIN D) 1000 units tablet Take 1 tablet (1,000 Units total) by mouth daily.    . Cyanocobalamin (B-12) 3000 MCG CAPS Take 1 capsule by mouth daily.    . fluticasone (FLONASE) 50 MCG/ACT nasal spray Place 2 sprays into both nostrils daily. (Patient taking differently: Place 2 sprays into both nostrils daily as needed. ) 16 g 6  . hydrochlorothiazide (HYDRODIURIL) 25 MG tablet TAKE 1 TABLET (25 MG TOTAL) BY MOUTH DAILY. 90 tablet 0  . ibuprofen (ADVIL,MOTRIN) 100 MG/5ML suspension Take 200-400 mg by mouth 2 (two) times daily as needed.     No current facility-administered medications for this visit.    Allergies:  Adhesive [tape] and Latex   Social History: The patient  reports that she quit smoking about 48 years ago. Her smoking use included  Cigarettes. She has a 2.50 pack-year smoking history. She has never used smokeless tobacco. She reports that she does not drink alcohol or use drugs.   Family History: The patient's family history includes Arthritis in her mother; Breast cancer in her unknown relative; Clotting disorder in her unknown relative; Diabetes in her unknown relative; Hypertension in her mother; Kidney disease in her mother; Lung cancer in her father; Osteoporosis in her mother.   ROS:  Please see the history of present illness. Otherwise, complete review of systems is positive for none.  All other systems are reviewed and negative.   Physical Exam: VS:  BP 118/74    Pulse 63   Ht 5\' 6"  (1.676 m)   Wt 194 lb (88 kg)   SpO2 97%   BMI 31.31 kg/m , BMI Body mass index is 31.31 kg/m.  Wt Readings from Last 3 Encounters:  09/08/16 194 lb (88 kg)  08/11/16 193 lb 12.8 oz (87.9 kg)  08/02/16 194 lb (88 kg)    General: Overweight woman, appears comfortable at rest. HEENT: Conjunctiva and lids normal, oropharynx clear. Neck: Supple, no elevated JVP or carotid bruits, no thyromegaly. Lungs: Clear to auscultation, nonlabored breathing at rest. Cardiac: Regular rate and rhythm, no S3 or significant systolic murmur, no pericardial rub. Abdomen: Soft, nontender, bowel sounds present, no guarding or rebound. Extremities: No pitting edema, distal pulses 2+. Skin: Warm and dry. Musculoskeletal: No kyphosis. Neuropsychiatric: Alert and oriented x3, affect grossly appropriate.  ECG: I personally reviewed the tracing from 08/11/2016 which showed sinus bradycardia with nonspecific T-wave changes.  Recent Labwork: 08/11/2016: ALT 14; AST 22; BNP <2.5; BUN 15; Creatinine, Ser 1.11; Potassium 4.1; Sodium 141     Component Value Date/Time   CHOL 170 12/23/2013 0942   TRIG 127 12/23/2013 0942   HDL 40 12/23/2013 0942    Other Studies Reviewed Today:  Echocardiogram 03/21/2012 [facility] cardiovascular): Mild LVH with LVEF 74%, grade 1 diastolic dysfunction, mild tricuspid regurgitation, normal pulmonary pressures.  Lexiscan Cardiolite 03/18/2012 [facility] cardiovascular): No myocardial perfusion abnormalities, LVEF 58%.  Assessment and Plan:   1. Fatigue and dyspnea on exertion. Patient reports symptoms mainly over the last 3 months. Recent ECG is nonspecific. Main cardiac risk factors hypertension. She has a remote history of tobacco use and also has obstructive sleep apnea. She has not undergone cardiac structural or ischemic evaluation in the last 4 years. We'll arrange an echocardiogram as well as a Lexicographer for further assessment.  2. Patient  reported indigestion symptoms and also dysphasia. She has been evaluated by gastroenterology in the past. May need follow-up assessment, she will discuss further with PCP.  3. Essential hypertension, on Tenormin and HCTZ. Blood pressure is well controlled today.  4. Intermittent ankle edema. Would limit salt in the diet. She is on HCTZ for treatment of hypertension. Echocardiogram also being obtained to assess cardiac structure and function as detailed above.  Current medicines were reviewed with the patient today.   Orders Placed This Encounter  Procedures  . NM Myocar Multi W/Spect W/Wall Motion / EF  . ECHOCARDIOGRAM COMPLETE    Disposition: Call with test results.  Signed, Satira Sark, MD, [facility] 09/08/2016 10:16 AM    [facility] [facility] [place], [place], [place] [address] Phone: [phone]; Fax: [phone]

## 2016-09-08 ENCOUNTER — Ambulatory Visit (INDEPENDENT_AMBULATORY_CARE_PROVIDER_SITE_OTHER): Payer: Medicare Other | Admitting: Cardiology

## 2016-09-08 ENCOUNTER — Encounter: Payer: Self-pay | Admitting: Cardiology

## 2016-09-08 ENCOUNTER — Telehealth: Payer: Self-pay | Admitting: Cardiology

## 2016-09-08 VITALS — BP 118/74 | HR 63 | Ht 66.0 in | Wt 194.0 lb

## 2016-09-08 DIAGNOSIS — K219 Gastro-esophageal reflux disease without esophagitis: Secondary | ICD-10-CM

## 2016-09-08 DIAGNOSIS — I10 Essential (primary) hypertension: Secondary | ICD-10-CM

## 2016-09-08 DIAGNOSIS — R5383 Other fatigue: Secondary | ICD-10-CM

## 2016-09-08 DIAGNOSIS — R0609 Other forms of dyspnea: Secondary | ICD-10-CM

## 2016-09-08 DIAGNOSIS — R600 Localized edema: Secondary | ICD-10-CM | POA: Diagnosis not present

## 2016-09-08 NOTE — Patient Instructions (Signed)
Medication Instructions:  Your physician recommends that you continue on your current medications as directed. Please refer to the Current Medication list given to you today.  Labwork: NONE  Testing/Procedures: Your physician has requested that you have an echocardiogram. Echocardiography is a painless test that uses sound waves to create images of your heart. It provides your doctor with information about the size and shape of your heart and how well your heart's chambers and valves are working. This procedure takes approximately one hour. There are no restrictions for this procedure.  Your physician has requested that you have a lexiscan myoview. For further information please visit [email or web address]. Please follow instruction sheet, as given.  Follow-Up: Your physician recommends that you schedule a follow-up appointment PENDING TEST RESULTS  Any Other Special Instructions Will Be Listed Below (If Applicable).  If you need a refill on your cardiac medications before your next appointment, please call your pharmacy. 

## 2016-09-08 NOTE — Telephone Encounter (Signed)
Echo & lexiscan for DOE scheduled at [facility] Sep 22, 2016

## 2016-09-11 ENCOUNTER — Encounter: Payer: Self-pay | Admitting: Podiatry

## 2016-09-11 ENCOUNTER — Ambulatory Visit (INDEPENDENT_AMBULATORY_CARE_PROVIDER_SITE_OTHER): Payer: Medicare Other | Admitting: Podiatry

## 2016-09-11 ENCOUNTER — Ambulatory Visit (INDEPENDENT_AMBULATORY_CARE_PROVIDER_SITE_OTHER): Payer: Medicare Other

## 2016-09-11 DIAGNOSIS — M779 Enthesopathy, unspecified: Secondary | ICD-10-CM | POA: Diagnosis not present

## 2016-09-11 DIAGNOSIS — R52 Pain, unspecified: Secondary | ICD-10-CM

## 2016-09-11 DIAGNOSIS — M7742 Metatarsalgia, left foot: Secondary | ICD-10-CM | POA: Diagnosis not present

## 2016-09-11 MED ORDER — MELOXICAM 7.5 MG PO TABS: 7.5000 mg | ORAL_TABLET | Freq: Every day | ORAL | 0 refills | Status: DC

## 2016-09-14 NOTE — Progress Notes (Signed)
Subjective: Laura James presents now save for concerns of left foot pain underneath the fourth toe which is been on about 2 weeks. She states that she has not had any recent injury or trauma she has no numbness or tingling. She is notany significant swelling or any redness to her feet. She does state that she's having some hip problems and she's been walking differently is not sure this is contributing to this or not. This is a new complaint compared to her last appointment with me. She has no other concerns. She's had no recent treatment for this. Denies any systemic complaints such as fevers, chills, nausea, vomiting. No acute changes since last appointment, and no other complaints at this time.   ROS: All other systems negative except for the above mentioned symptoms  Objective: AAO x3, NAD DP/PT pulses palpable bilaterally, CRT less than 3 seconds There is tenderness on the plantar aspect of the fourth metatarsal head. This prominence metatarsal head with atrophy of the fat pad. There is no apparent dorsal aspect of the metatarsal. There is no edema to the dorsal aspect of the foot however small amount of edema is present the fourth metatarsal head plantarly. There is no area functions or crepitus. There is no erythema. There is no clinical signs of infection present. No open lesions or pre-ulcerative lesions.  No pain with calf compression, swelling, warmth, erythema  Assessment: Capsulitis/metatarsalgia left foot  Plan: -All treatment options discussed with the patient including all alternatives, risks, complications.  -X-rays were reviewed. No evidence of acute fracture didn't 5 today. -Discussed a steroid injection but she wishes to hold off. I prescribed meloxicam discussed side effects the medication. -Metatarsal off weight has were dispensed. -Discussed shoe gear modifications and inserts. -Follow-up in the next 2-3 weeks if symptoms continue or sooner if needed. -Patient encouraged to  call the office with any questions, concerns, change in symptoms.   Celesta Gentile, DPM

## 2016-09-22 ENCOUNTER — Encounter (HOSPITAL_COMMUNITY): Payer: Self-pay

## 2016-09-22 ENCOUNTER — Ambulatory Visit (HOSPITAL_BASED_OUTPATIENT_CLINIC_OR_DEPARTMENT_OTHER)
Admission: RE | Admit: 2016-09-22 | Discharge: 2016-09-22 | Disposition: A | Discharge status: Home / Self Care | Payer: Medicare Other | Source: Ambulatory Visit | Attending: Cardiology | Admitting: Cardiology

## 2016-09-22 ENCOUNTER — Telehealth: Payer: Self-pay

## 2016-09-22 ENCOUNTER — Inpatient Hospital Stay (HOSPITAL_COMMUNITY): Admission: RE | Admit: 2016-09-22 | Payer: Medicare Other | Source: Ambulatory Visit

## 2016-09-22 ENCOUNTER — Encounter (HOSPITAL_COMMUNITY)
Admission: RE | Admit: 2016-09-22 | Discharge: 2016-09-22 | Disposition: A | Discharge status: Home / Self Care | Payer: Medicare Other | Source: Ambulatory Visit | Attending: Cardiology | Admitting: Cardiology

## 2016-09-22 DIAGNOSIS — I069 Rheumatic aortic valve disease, unspecified: Secondary | ICD-10-CM | POA: Insufficient documentation

## 2016-09-22 DIAGNOSIS — R0609 Other forms of dyspnea: Secondary | ICD-10-CM

## 2016-09-22 LAB — NM MYOCAR MULTI W/SPECT W/WALL MOTION / EF
CHL CUP NUCLEAR SSS: 2
CHL CUP RESTING HR STRESS: 55 {beats}/min
LHR: 0.39
LVDIAVOL: 73 mL (ref 46–106)
LVSYSVOL: 27 mL
NUC STRESS TID: 1.25
Peak HR: 90 {beats}/min
SDS: 1
SRS: 1

## 2016-09-22 MED ORDER — REGADENOSON 0.4 MG/5ML IV SOLN
INTRAVENOUS | Status: AC
  Administered 2016-09-22: 0.4 mg via INTRAVENOUS
  Filled 2016-09-22: qty 5

## 2016-09-22 MED ORDER — TECHNETIUM TC 99M TETROFOSMIN IV KIT
10.0000 | PACK | Freq: Once | INTRAVENOUS | Status: AC | PRN
  Administered 2016-09-22: 11 via INTRAVENOUS

## 2016-09-22 MED ORDER — TECHNETIUM TC 99M TETROFOSMIN IV KIT
30.0000 | PACK | Freq: Once | INTRAVENOUS | Status: AC | PRN
  Administered 2016-09-22: 31 via INTRAVENOUS

## 2016-09-22 MED ORDER — SODIUM CHLORIDE 0.9% FLUSH
INTRAVENOUS | Status: AC
  Administered 2016-09-22: 10 mL via INTRAVENOUS
  Filled 2016-09-22: qty 10

## 2016-09-22 NOTE — Telephone Encounter (Signed)
Patient notified. Routed to PCP 

## 2016-09-22 NOTE — Telephone Encounter (Signed)
-----   Message from Merlene Laughter, LPN sent at 2/68/3419  2:55 PM EDT -----   ----- Message ----- From: Satira Sark, MD Sent: 09/22/2016   2:49 PM To: Merlene Laughter, LPN, Sharion Balloon, FNP  Results reviewed. Please let her know that her stress test looked good overall, low risk without clear evidence of obstructive CAD to explain symptoms. Would not anticipate further cardiac testing at this time. Keep follow-up with PCP A copy of this test should be forwarded to Sharion Balloon, FNP.

## 2016-09-22 NOTE — Telephone Encounter (Signed)
-----   Message from Merlene Laughter, LPN sent at 4/35/3912 12:16 PM EDT -----   ----- Message ----- From: Satira Sark, MD Sent: 09/22/2016  11:38 AM To: Merlene Laughter, LPN, Sharion Balloon, FNP  Results reviewed. LVEF normal range at 55-60%. No major valvular abnormalities. A copy of this test should be forwarded to Sharion Balloon, FNP.

## 2016-09-22 NOTE — Telephone Encounter (Signed)
Returning call.

## 2016-09-22 NOTE — Telephone Encounter (Signed)
-----   Message from Merlene Laughter, LPN sent at 4/98/2641 12:16 PM EDT -----   ----- Message ----- From: Satira Sark, MD Sent: 09/22/2016  11:38 AM To: Merlene Laughter, LPN, Sharion Balloon, FNP  Results reviewed. LVEF normal range at 55-60%. No major valvular abnormalities. A copy of this test should be forwarded to Sharion Balloon, FNP.

## 2016-09-22 NOTE — Progress Notes (Signed)
*  PRELIMINARY RESULTS* Echocardiogram 2D Echocardiogram has been performed.  Laura James 09/22/2016, 10:28 AM

## 2016-09-24 ENCOUNTER — Other Ambulatory Visit: Payer: Self-pay | Admitting: Family

## 2016-10-08 ENCOUNTER — Other Ambulatory Visit: Payer: Self-pay | Admitting: Podiatry

## 2016-10-09 NOTE — Telephone Encounter (Signed)
Pt needs an appt prior to future refills. 

## 2016-10-12 ENCOUNTER — Encounter: Payer: Self-pay | Admitting: Family

## 2016-10-12 ENCOUNTER — Ambulatory Visit (INDEPENDENT_AMBULATORY_CARE_PROVIDER_SITE_OTHER): Payer: Medicare Other | Admitting: Family

## 2016-10-12 VITALS — BP 109/69 | HR 62 | Temp 97.4°F | Ht 66.0 in | Wt 195.6 lb

## 2016-10-12 DIAGNOSIS — M545 Low back pain: Secondary | ICD-10-CM | POA: Diagnosis not present

## 2016-10-12 DIAGNOSIS — M150 Primary generalized (osteo)arthritis: Secondary | ICD-10-CM | POA: Diagnosis not present

## 2016-10-12 DIAGNOSIS — K219 Gastro-esophageal reflux disease without esophagitis: Secondary | ICD-10-CM

## 2016-10-12 DIAGNOSIS — M1990 Unspecified osteoarthritis, unspecified site: Secondary | ICD-10-CM | POA: Insufficient documentation

## 2016-10-12 DIAGNOSIS — G8929 Other chronic pain: Secondary | ICD-10-CM

## 2016-10-12 DIAGNOSIS — G4733 Obstructive sleep apnea (adult) (pediatric): Secondary | ICD-10-CM

## 2016-10-12 DIAGNOSIS — Z1159 Encounter for screening for other viral diseases: Secondary | ICD-10-CM | POA: Diagnosis not present

## 2016-10-12 DIAGNOSIS — I10 Essential (primary) hypertension: Secondary | ICD-10-CM | POA: Diagnosis not present

## 2016-10-12 DIAGNOSIS — M159 Polyosteoarthritis, unspecified: Secondary | ICD-10-CM

## 2016-10-12 NOTE — Patient Instructions (Signed)

## 2016-10-12 NOTE — Progress Notes (Signed)
   Subjective:    Patient ID: Laura James, female    DOB: Jan 20, 1951, 66 y.o.   MRN: [id number]  Pt presents to the office today for chronic follow up.  Hypertension  This is a chronic problem. The current episode started more than 1 year ago. The problem has been resolved since onset. The problem is controlled. Associated symptoms include malaise/fatigue. Pertinent negatives include no peripheral edema or shortness of breath. Risk factors for coronary artery disease include dyslipidemia and sedentary lifestyle. The current treatment provides moderate improvement. There is no history of kidney disease, CAD/MI, CVA or heart failure.  Gastroesophageal Reflux  She complains of heartburn. She reports no belching or no dysphagia. This is a chronic problem. The current episode started more than 1 year ago. The problem occurs occasionally. The symptoms are aggravated by certain foods. She has tried an antacid for the symptoms. The treatment provided moderate relief.  Arthritis  Presents for follow-up visit. She complains of pain and stiffness. The symptoms have been stable. Affected location: lower back. Her pain is at a severity of 8/10.  OSA Pt sleeps with CPAP 2 nights out of a week.     Review of Systems  Constitutional: Positive for malaise/fatigue.  Respiratory: Negative for shortness of breath.   Gastrointestinal: Positive for heartburn. Negative for dysphagia.  Musculoskeletal: Positive for arthritis and stiffness.  All other systems reviewed and are negative.      Objective:   Physical Exam  Constitutional: She is oriented to person, place, and time. She appears well-developed and well-nourished. No distress.  HENT:  Head: Normocephalic and atraumatic.  Right Ear: External ear normal.  Left Ear: External ear normal.  Nose: Nose normal.  Mouth/Throat: Oropharynx is clear and moist.  Eyes: Pupils are equal, round, and reactive to light.  Neck: Normal range of motion. Neck supple.  No thyromegaly present.  Cardiovascular: Normal rate, regular rhythm, normal heart sounds and intact distal pulses.   No murmur heard. Pulmonary/Chest: Effort normal and breath sounds normal. No respiratory distress. She has no wheezes.  Abdominal: Soft. Bowel sounds are normal. She exhibits no distension. There is no tenderness.  Musculoskeletal: Normal range of motion. She exhibits no edema or tenderness.  Neurological: She is alert and oriented to person, place, and time.  Skin: Skin is warm and dry.  Psychiatric: She has a normal mood and affect. Her behavior is normal. Judgment and thought content normal.  Vitals reviewed.     BP 109/69   Pulse 62   Temp (!) 97.4 F (36.3 C) (Oral)   Ht '5\' 6"'$  (1.676 m)   Wt 195 lb 9.6 oz (88.7 kg)   BMI 31.57 kg/m      Assessment & Plan:  1. Essential hypertension, benign - CMP14+EGFR - Lipid panel  2. Obstructive sleep apnea - CMP14+EGFR  3. Gastroesophageal reflux disease without esophagitis - CMP14+EGFR  4. Primary osteoarthritis involving multiple joints - CMP14+EGFR - Ambulatory referral to Orthopedic Surgery  5. Need for hepatitis C screening test - CMP14+EGFR - Hepatitis C antibody  6. Chronic bilateral low back pain without sciatica  - Ambulatory referral to Orthopedic Surgery   Continue all meds Labs pending Health Maintenance reviewed Diet and exercise encouraged RTO 6 months  Evelina Dun, FNP

## 2016-10-13 LAB — CMP14+EGFR
A/G RATIO: 1.3 (ref 1.2–2.2)
ALBUMIN: 3.9 g/dL (ref 3.6–4.8)
ALK PHOS: 81 IU/L (ref 39–117)
ALT: 16 IU/L (ref 0–32)
AST: 27 IU/L (ref 0–40)
BUN / CREAT RATIO: 14 (ref 12–28)
BUN: 16 mg/dL (ref 8–27)
Bilirubin Total: 0.2 mg/dL (ref 0.0–1.2)
CO2: 27 mmol/L (ref 20–29)
Calcium: 9.6 mg/dL (ref 8.7–10.3)
Chloride: 101 mmol/L (ref 96–106)
Creatinine, Ser: 1.13 mg/dL — ABNORMAL HIGH (ref 0.57–1.00)
GFR calc Af Amer: 58 mL/min/{1.73_m2} — ABNORMAL LOW (ref >59)
GFR calc non Af Amer: 50 mL/min/{1.73_m2} — ABNORMAL LOW (ref >59)
Globulin, Total: 3 g/dL (ref 1.5–4.5)
Glucose: 92 mg/dL (ref 65–99)
POTASSIUM: 3.9 mmol/L (ref 3.5–5.2)
SODIUM: 141 mmol/L (ref 134–144)
Total Protein: 6.9 g/dL (ref 6.0–8.5)

## 2016-10-13 LAB — LIPID PANEL
CHOL/HDL RATIO: 4.2 ratio (ref 0.0–4.4)
CHOLESTEROL TOTAL: 162 mg/dL (ref 100–199)
HDL: 39 mg/dL — ABNORMAL LOW (ref >39)
LDL Calculated: 91 mg/dL (ref 0–99)
TRIGLYCERIDES: 158 mg/dL — AB (ref 0–149)
VLDL Cholesterol Cal: 32 mg/dL (ref 5–40)

## 2016-10-13 LAB — HEPATITIS C ANTIBODY: Hep C Virus Ab: <0.1 s/co ratio (ref 0.0–0.9)

## 2016-10-18 DIAGNOSIS — H25813 Combined forms of age-related cataract, bilateral: Secondary | ICD-10-CM | POA: Diagnosis not present

## 2016-10-18 DIAGNOSIS — H04123 Dry eye syndrome of bilateral lacrimal glands: Secondary | ICD-10-CM | POA: Diagnosis not present

## 2016-10-18 DIAGNOSIS — H43813 Vitreous degeneration, bilateral: Secondary | ICD-10-CM | POA: Diagnosis not present

## 2016-10-26 DIAGNOSIS — M7061 Trochanteric bursitis, right hip: Secondary | ICD-10-CM | POA: Diagnosis not present

## 2016-10-26 DIAGNOSIS — M5442 Lumbago with sciatica, left side: Secondary | ICD-10-CM | POA: Diagnosis not present

## 2016-10-26 DIAGNOSIS — G8929 Other chronic pain: Secondary | ICD-10-CM | POA: Diagnosis not present

## 2016-10-26 DIAGNOSIS — M5441 Lumbago with sciatica, right side: Secondary | ICD-10-CM | POA: Diagnosis not present

## 2016-11-16 DIAGNOSIS — Z1231 Encounter for screening mammogram for malignant neoplasm of breast: Secondary | ICD-10-CM | POA: Diagnosis not present

## 2016-11-18 LAB — HM MAMMOGRAPHY

## 2016-11-24 ENCOUNTER — Ambulatory Visit (INDEPENDENT_AMBULATORY_CARE_PROVIDER_SITE_OTHER): Payer: Medicare Other | Admitting: *Deleted

## 2016-11-24 DIAGNOSIS — Z23 Encounter for immunization: Secondary | ICD-10-CM

## 2016-12-09 ENCOUNTER — Other Ambulatory Visit: Payer: Self-pay | Admitting: Family Medicine

## 2017-01-11 ENCOUNTER — Ambulatory Visit (INDEPENDENT_AMBULATORY_CARE_PROVIDER_SITE_OTHER): Payer: Medicare Other | Admitting: Family

## 2017-01-11 ENCOUNTER — Encounter: Payer: Self-pay | Admitting: Family

## 2017-01-11 VITALS — BP 121/78 | HR 72 | Temp 98.3°F | Ht 66.0 in | Wt 201.8 lb

## 2017-01-11 DIAGNOSIS — J189 Pneumonia, unspecified organism: Secondary | ICD-10-CM | POA: Diagnosis not present

## 2017-01-11 MED ORDER — ALBUTEROL SULFATE HFA 108 (90 BASE) MCG/ACT IN AERS: 2.0000 | INHALATION_SPRAY | Freq: Four times a day (QID) | RESPIRATORY_TRACT | 0 refills | Status: DC | PRN

## 2017-01-11 MED ORDER — PREDNISONE 10 MG (21) PO TBPK: ORAL_TABLET | ORAL | 0 refills | Status: DC

## 2017-01-11 MED ORDER — BENZONATATE 200 MG PO CAPS: 200.0000 mg | ORAL_CAPSULE | Freq: Three times a day (TID) | ORAL | 1 refills | Status: DC | PRN

## 2017-01-11 MED ORDER — AZITHROMYCIN 250 MG PO TABS: ORAL_TABLET | ORAL | 0 refills | Status: DC

## 2017-01-11 MED ORDER — HYDROCODONE-HOMATROPINE 5-1.5 MG/5ML PO SYRP: 5.0000 mL | ORAL_SOLUTION | Freq: Three times a day (TID) | ORAL | 0 refills | Status: DC | PRN

## 2017-01-11 NOTE — Progress Notes (Signed)
   Subjective:    Patient ID: Laura James, female    DOB: 20-Sep-1950, 66 y.o.   MRN: [id number]  Cough  This is a new problem. The current episode started 1 to 4 weeks ago. The problem has been unchanged. The problem occurs every few minutes. The cough is productive of sputum and productive of purulent sputum. Associated symptoms include chills, shortness of breath and wheezing. Pertinent negatives include no ear congestion, ear pain, fever, nasal congestion or sore throat. The symptoms are aggravated by lying down. She has tried rest and OTC cough suppressant for the symptoms. The treatment provided mild relief. There is no history of asthma or COPD.      Review of Systems  Constitutional: Positive for chills. Negative for fever.  HENT: Negative for ear pain and sore throat.   Respiratory: Positive for cough, shortness of breath and wheezing.   All other systems reviewed and are negative.      Objective:   Physical Exam  Constitutional: She is oriented to person, place, and time. She appears well-developed and well-nourished. No distress.  HENT:  Head: Normocephalic and atraumatic.  Right Ear: External ear normal.  Left Ear: External ear normal.  Nose: Mucosal edema present.  Mouth/Throat: Oropharynx is clear and moist.  Eyes: Pupils are equal, round, and reactive to light.  Neck: Normal range of motion. Neck supple. No thyromegaly present.  Cardiovascular: Normal rate, regular rhythm, normal heart sounds and intact distal pulses.  No murmur heard. Pulmonary/Chest: Effort normal. No respiratory distress. She has no wheezes. She has rhonchi.  Intermittent coarse cough   Abdominal: Soft. Bowel sounds are normal. She exhibits no distension. There is no tenderness.  Musculoskeletal: Normal range of motion. She exhibits no edema or tenderness.  Neurological: She is alert and oriented to person, place, and time.  Skin: Skin is warm and dry.  Psychiatric: She has a normal mood and  affect. Her behavior is normal. Judgment and thought content normal.  Vitals reviewed.     BP 121/78   Pulse 72   Temp 98.3 F (36.8 C) (Oral)   Ht 5\' 6"  (1.676 m)   Wt 201 lb 12.8 oz (91.5 kg)   BMI 32.57 kg/m      Assessment & Plan:  1. Community acquired pneumonia, unspecified laterality - Take meds as prescribed - Use a cool mist humidifier  -Use saline nose sprays frequently -Force fluids -For any cough or congestion  Use plain Mucinex- regular strength or max strength is fine -For fever or aces or pains- take tylenol or ibuprofen appropriate for age and weight. ibuprofen and tylenol every  3 hours. -Throat lozenges if help - predniSONE (STERAPRED UNI-PAK 21 TAB) 10 MG (21) TBPK tablet; Use as directed  Dispense: 21 tablet; Refill: 0 - azithromycin (ZITHROMAX) 250 MG tablet; Take 500 mg once, then 250 mg for four days  Dispense: 6 tablet; Refill: 0 - HYDROcodone-homatropine (HYCODAN) 5-1.5 MG/5ML syrup; Take 5 mLs by mouth every 8 (eight) hours as needed for cough.  Dispense: 120 mL; Refill: 0 - benzonatate (TESSALON) 200 MG capsule; Take 1 capsule (200 mg total) by mouth 3 (three) times daily as needed.  Dispense: 30 capsule; Refill: [place], FNP

## 2017-01-11 NOTE — Patient Instructions (Signed)

## 2017-01-29 ENCOUNTER — Encounter: Payer: Self-pay | Admitting: Family Medicine

## 2017-01-29 ENCOUNTER — Ambulatory Visit (INDEPENDENT_AMBULATORY_CARE_PROVIDER_SITE_OTHER): Payer: PPO

## 2017-01-29 ENCOUNTER — Ambulatory Visit (INDEPENDENT_AMBULATORY_CARE_PROVIDER_SITE_OTHER): Payer: PPO | Admitting: Family Medicine

## 2017-01-29 VITALS — BP 122/80 | HR 67 | Temp 98.4°F | Ht 66.0 in | Wt 197.0 lb

## 2017-01-29 DIAGNOSIS — J029 Acute pharyngitis, unspecified: Secondary | ICD-10-CM

## 2017-01-29 DIAGNOSIS — R058 Other specified cough: Secondary | ICD-10-CM

## 2017-01-29 DIAGNOSIS — R05 Cough: Secondary | ICD-10-CM

## 2017-01-29 LAB — CULTURE, GROUP A STREP

## 2017-01-29 LAB — RAPID STREP SCREEN (MED CTR MEBANE ONLY): STREP GP A AG, IA W/REFLEX: NEGATIVE

## 2017-01-29 NOTE — Progress Notes (Signed)
Subjective: CC: cold symptoms PCP: Sharion Balloon, FNP CZY:SAYTK S Ibach is a 67 y.o. female presenting to clinic today for:  1. Cold symptoms  Patient reports cough, sore throat, sneezing, earache that started about 1 week ago.  She notes that she was actually seen before Christmas here for cough/bronchitis.  She was treated with an antibiotic, oral steroid, Tessalon Perles and provided cough syrup for symptom relief.  She notes that she completed the steroid and oral antibiotic.  She discontinued the cough suppressants a couple of days ago because she did not find that these were being particularly helpful.  She used her albuterol inhaler twice yesterday with some relief.  She notes that she recently cared for her grandchild, who has strep throat.  Denies hemoptysis, headache, SOB, dizziness, rash, nausea, vomiting, diarrhea, fevers, chills, myalgia, recent travel.  Denies history of COPD or asthma.  Denies tobacco use/ exposure.    ROS: Per HPI  Allergies  Allergen Reactions  . Adhesive [Tape] Itching and Rash  . Latex Rash   Past Medical History:  Diagnosis Date  . Barrett esophagus   . 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 Depression   . Diverticulosis of colon (without mention of hemorrhage)   . Esophageal reflux   . Essential hypertension, benign   . Gastritis   . Grave's disease   . Hiatal hernia   . Hypothyroidism   . IBS (irritable bowel syndrome)   . Obesity   . Osteopenia   . Sleep apnea   . Symptomatic menopausal or female climacteric states     Current Outpatient Medications:  .  albuterol (PROVENTIL HFA;VENTOLIN HFA) 108 (90 Base) MCG/ACT inhaler, Inhale 2 puffs into the lungs every 6 (six) hours as needed for wheezing or shortness of breath., Disp: 1 Inhaler, Rfl: 0 .  aspirin 81 MG tablet, Take 81 mg by mouth daily.  , Disp: , Rfl:  .  atenolol (TENORMIN) 25 MG tablet, TAKE 1 TABLET (25 MG TOTAL) BY MOUTH DAILY.  NEEDS TO BE SEEN, Disp: 30 tablet, Rfl: 2 .  benzonatate (TESSALON) 200 MG capsule, Take 1 capsule (200 mg total) by mouth 3 (three) times daily as needed., Disp: 30 capsule, Rfl: 1 .  cetirizine (ZYRTEC) 10 MG tablet, Take 10 mg by mouth daily as needed. , Disp: , Rfl:  .  cholecalciferol (VITAMIN D) 1000 units tablet, Take 1 tablet (1,000 Units total) by mouth daily., Disp: , Rfl:  .  Cyanocobalamin (B-12) 3000 MCG CAPS, Take 1 capsule by mouth daily., Disp: , Rfl:  .  fluticasone (FLONASE) 50 MCG/ACT nasal spray, Place 2 sprays into both nostrils daily. (Patient taking differently: Place 2 sprays into both nostrils daily as needed. ), Disp: 16 g, Rfl: 6 .  hydrochlorothiazide (HYDRODIURIL) 25 MG tablet, TAKE 1 TABLET (25 MG TOTAL) BY MOUTH DAILY., Disp: 90 tablet, Rfl: 0 .  HYDROcodone-homatropine (HYCODAN) 5-1.5 MG/5ML syrup, Take 5 mLs by mouth every 8 (eight) hours as needed for cough., Disp: 120 mL, Rfl: 0 .  ibuprofen (ADVIL,MOTRIN) 100 MG/5ML suspension, Take 200-400 mg by mouth 2 (two) times daily as needed., Disp: , Rfl:  .  Omeprazole (PRILOSEC PO), Take by mouth., Disp: , Rfl:  Social History   Socioeconomic History  . Marital status: Married    Spouse name: Not on file  . Number of children: 3  . Years of education: Not on file  . Highest education level: Not on  file  Social Needs  . Financial resource strain: Not on file  . Food insecurity - worry: Not on file  . Food insecurity - inability: Not on file  . Transportation needs - medical: Not on file  . Transportation needs - non-medical: Not on file  Occupational History  . Occupation: Hospice    Comment: [employer]  Tobacco Use  . Smoking status: Former Smoker    Packs/day: 0.50    Years: 5.00    Pack years: 2.50    Types: Cigarettes    Last attempt to quit: 01/24/1968    Years since quitting: 49.0  . Smokeless tobacco: Never Used  Substance and Sexual Activity  . Alcohol use: No    Alcohol/week: 0.0 oz  .  Drug use: No  . Sexual activity: Not on file  Other Topics Concern  . Not on file  Social History Narrative   Lives at home with husband and granddaughter.   They are rasing a great grandson.     Family History  Problem Relation Age of Onset  . Lung cancer Father   . Hypertension Mother   . Arthritis Mother   . Osteoporosis Mother   . Kidney disease Mother        related to APAP use  . Diabetes Unknown   . Clotting disorder Unknown   . Breast cancer Unknown        aunt  . Colon cancer Neg Hx     Objective: Office vital signs reviewed. BP 122/80   Pulse 67   Temp 98.4 F (36.9 C) (Oral)   Ht 5\' 6"  (1.676 m)   Wt 197 lb (89.4 kg)   SpO2 96%   BMI 31.80 kg/m   Physical Examination:  General: Awake, alert, well nourished, nontoxic, No acute distress HEENT: Normal    Neck: No masses palpated. No lymphadenopathy    Ears: Tympanic membranes intact, normal light reflex, no erythema, no bulging    Eyes: PERRLA, extraocular membranes intact, sclera white, no ocular discharge    Nose: nasal turbinates moist, clear nasal discharge    Throat: moist mucus membranes, mild oropharyngeal erythema, no tonsillar exudate.  Airway is patent Cardio: regular rate and rhythm, S1S2 heard, no murmurs appreciated Pulm: Mild expiratory wheezes appreciated throughout.  No rhonchi or rales; normal work of breathing on room air  Dg Chest 2 View  Result Date: 01/29/2017 CLINICAL DATA:  Cough, congestion EXAM: CHEST  2 VIEW COMPARISON:  05/31/2016 FINDINGS: Heart is borderline in size. Lungs are clear. No effusions or edema. No acute bony abnormality. IMPRESSION: Borderline cardiomegaly.  No active disease. Electronically Signed   By: Rolm Baptise M.D.   On: 01/29/2017 11:37    Assessment/ Plan: 67 y.o. female   1. Sore throat Rapid strep is negative.  Likely secondary to postnasal drip in the setting of URI.  Supportive care recommended. - Rapid Strep Screen (Not at [facility])  2. Cough present  for greater than 3 weeks Chest x-ray obtained, which did not reveal any acute pulmonary processes.  Her physical exam was remarkable for mild expiratory wheezes.  She was treated with a DuoNeb which improved this.  I recommended that she use her albuterol inhaler 2 puffs every 6 hours scheduled for the next 2 days then she may use this every 6 hours as needed as directed.  A work note was provided excusing her for the next 72 hours.  I recommended that she push oral fluids, rest, use cough suppressants  as needed as directed.  She may obtain Coricidin for other symptom management.  Handout was provided.  Strict return precautions and reasons for emergent evaluation in the emergency department review with patient.  They voiced understanding and will follow-up as needed. - DG Chest 2 View; Future   Orders Placed This Encounter  Procedures  . Rapid Strep Screen (Not at [facility])  . DG Chest 2 View    Standing Status:   Future    Number of Occurrences:   1    Standing Expiration Date:   03/30/2018    Order Specific Question:   Reason for Exam (SYMPTOM  OR DIAGNOSIS REQUIRED)    Answer:   cough >3 weeks    Order Specific Question:   Preferred imaging location?    Answer:   Internal    Order Specific Question:   Radiology Contrast Protocol - do NOT remove file path    Answer:   [email or web address]   No orders of the defined types were placed in this encounter.    Janora Norlander, DO [place] [phone]

## 2017-01-29 NOTE — Patient Instructions (Addendum)
Your chest x-ray was negative for pneumonia.  Your strep test was also negative.  No antibiotics are needed at this time.  Continue the cough medications that were prescribed to you.  I would like you to use your albuterol inhaler 2 puffs every 6 hours for the next 2 days.  Then you may resume as needed use as directed.  I agree with you that it appears that you have a viral upper respiratory infection (cold).  Cold symptoms can last up to 2 weeks.  I recommend that you only use cold medications that are safe in high blood pressure like Coricidin (generic is fine).  Other cold medications can increase your blood pressure.    - Get plenty of rest and drink plenty of fluids. - Try to breathe moist air. Use a cold mist humidifier. - Consume warm fluids (soup or tea) to provide relief for a stuffy nose and to loosen phlegm. - For nasal stuffiness, try saline nasal spray or a Neti Pot.  Afrin nasal spray can also be used but this product should not be used longer than 3 days or it will cause rebound nasal stuffiness (worsening nasal congestion). - For sore throat pain relief: suck on throat lozenges, hard candy or popsicles; gargle with warm salt water (1/4 tsp. salt per 8 oz. of water); and eat soft, bland foods. - Eat a well-balanced diet. If you cannot, ensure you are getting enough nutrients by taking a daily multivitamin. - Avoid dairy products, as they can thicken phlegm. - Avoid alcohol, as it impairs your body's immune system.  CONTACT YOUR DOCTOR IF YOU EXPERIENCE ANY OF THE FOLLOWING: - High fever - Ear pain - Sinus-type headache - Unusually severe cold symptoms - Cough that gets worse while other cold symptoms improve - Flare up of any chronic lung problem, such as asthma - Your symptoms persist longer than 2 weeks

## 2017-02-21 ENCOUNTER — Other Ambulatory Visit: Payer: Self-pay | Admitting: Dermatology

## 2017-02-21 DIAGNOSIS — L820 Inflamed seborrheic keratosis: Secondary | ICD-10-CM | POA: Diagnosis not present

## 2017-02-21 DIAGNOSIS — D229 Melanocytic nevi, unspecified: Secondary | ICD-10-CM | POA: Diagnosis not present

## 2017-02-21 DIAGNOSIS — D485 Neoplasm of uncertain behavior of skin: Secondary | ICD-10-CM | POA: Diagnosis not present

## 2017-02-21 DIAGNOSIS — L821 Other seborrheic keratosis: Secondary | ICD-10-CM | POA: Diagnosis not present

## 2017-02-21 DIAGNOSIS — L918 Other hypertrophic disorders of the skin: Secondary | ICD-10-CM | POA: Diagnosis not present

## 2017-03-09 ENCOUNTER — Other Ambulatory Visit: Payer: Self-pay | Admitting: *Deleted

## 2017-03-09 MED ORDER — ATENOLOL 25 MG PO TABS: ORAL_TABLET | ORAL | 0 refills | Status: DC

## 2017-04-22 IMAGING — US US ABDOMEN LIMITED
1 series · 14 of 25 positions shown · non-contrast
Comparison: None.

CLINICAL DATA: Right upper quadrant pain.

EXAM:
US ABDOMEN LIMITED - RIGHT UPPER QUADRANT

[Series 1: us abdomen limited · 0.22mm/px · 14 of 38 slices shown]
[im 1/38]
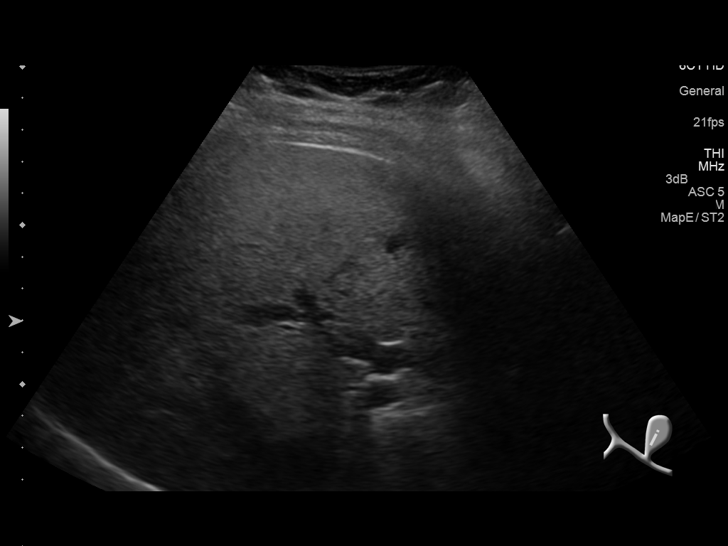
[im 4/38]
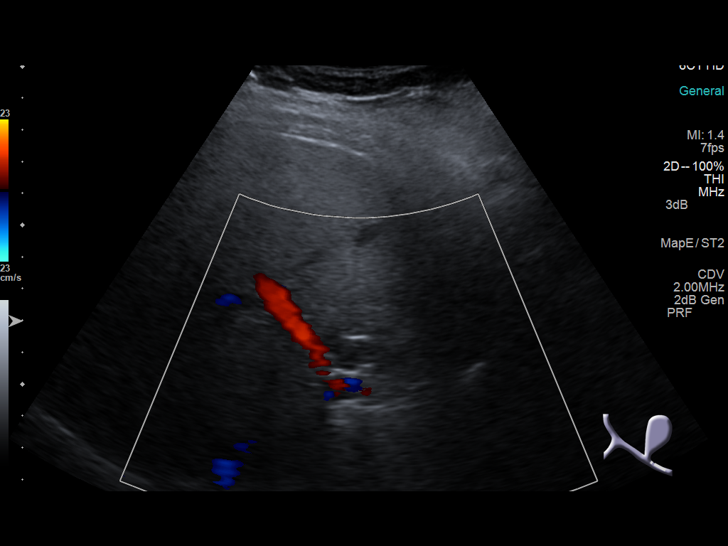
[im 7/38]
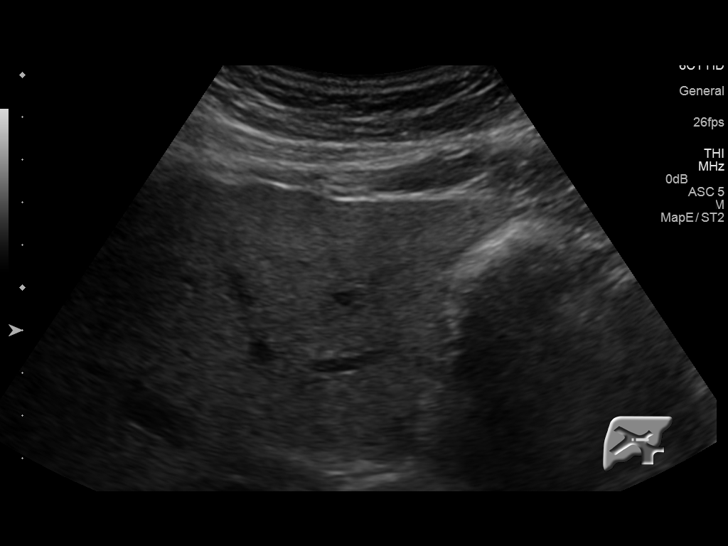
[im 10/38]
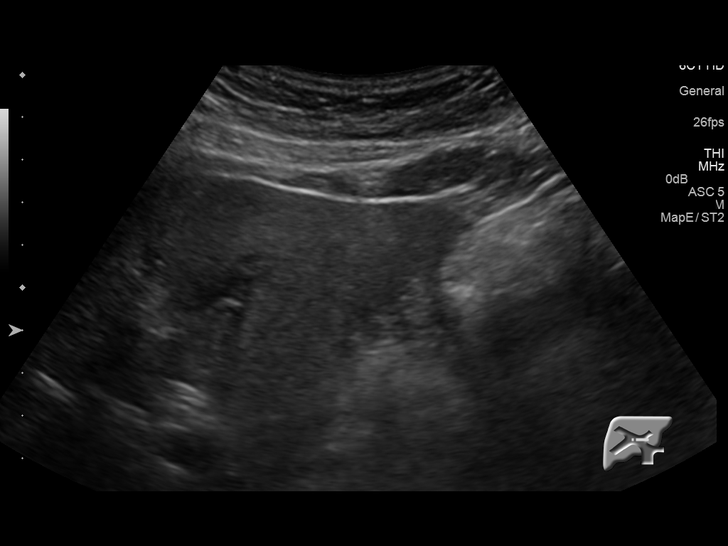
[im 13/38]
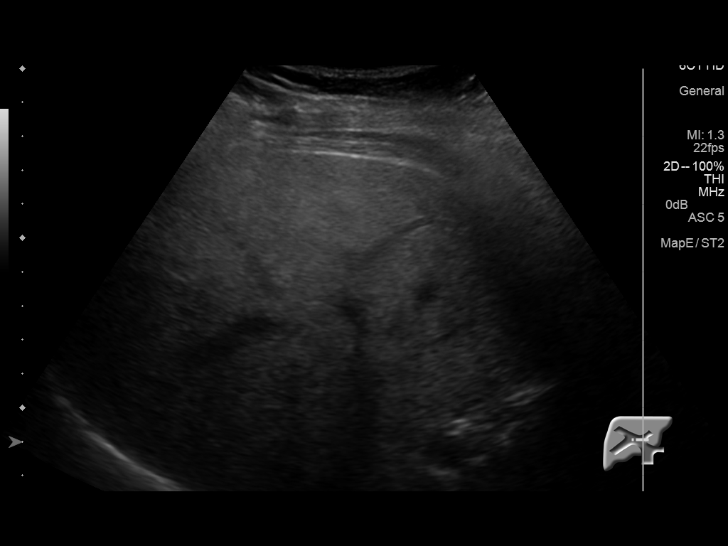
[im 14/38]
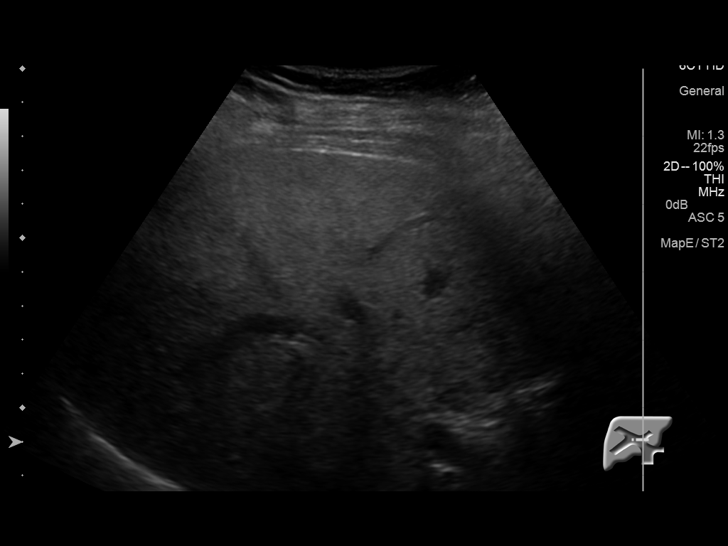
[im 17/38]
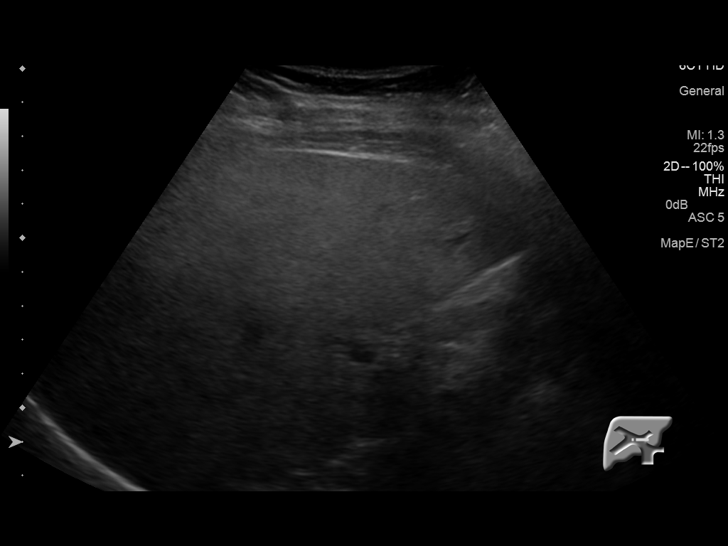
[im 21/38]
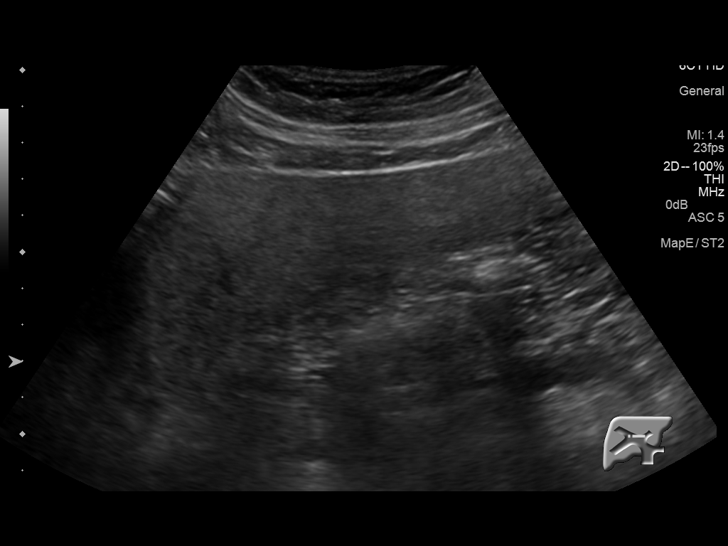
[im 24/38]
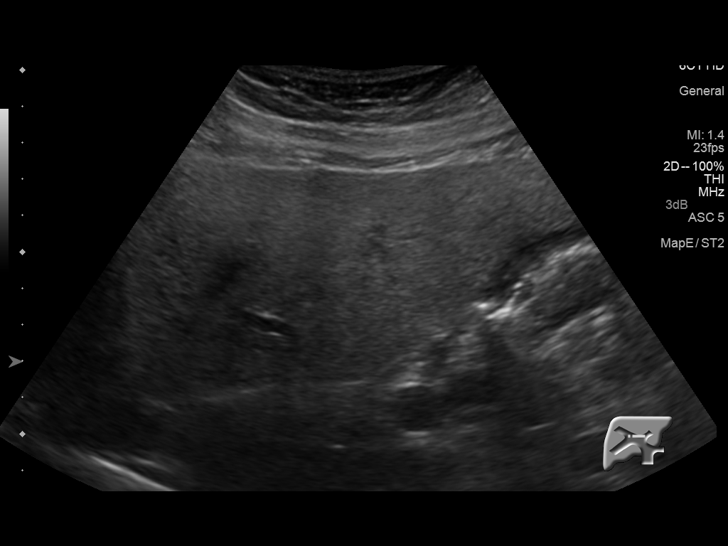
[im 25/38]
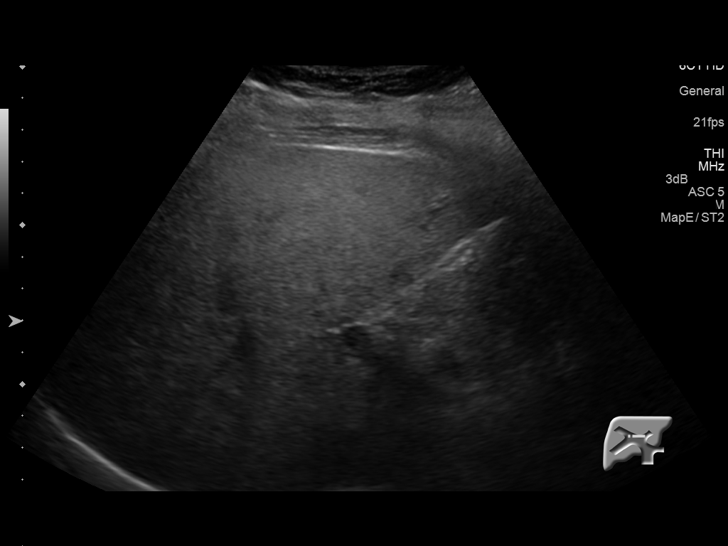
[im 28/38]
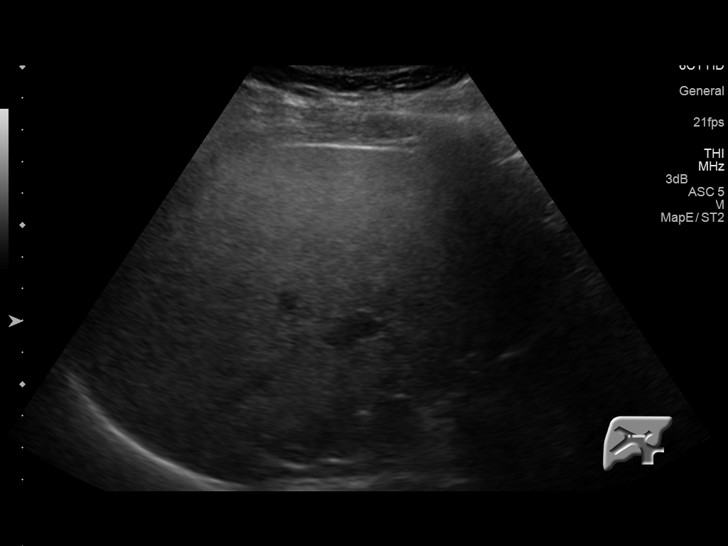
[im 31/38]
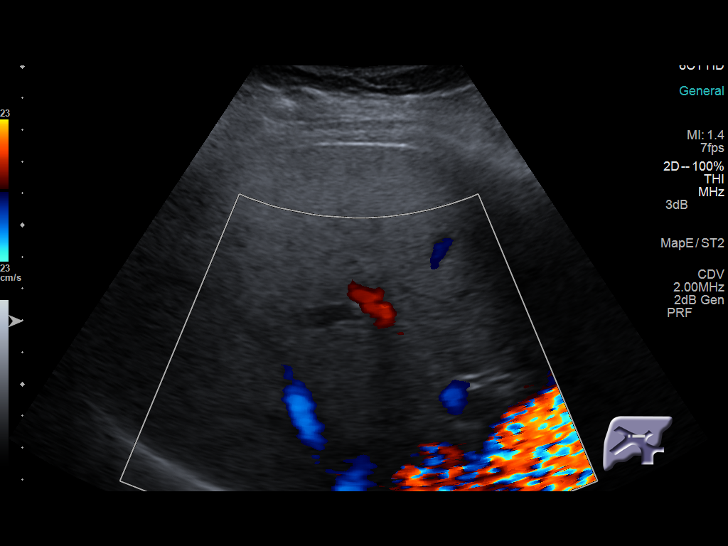
[im 34/38]
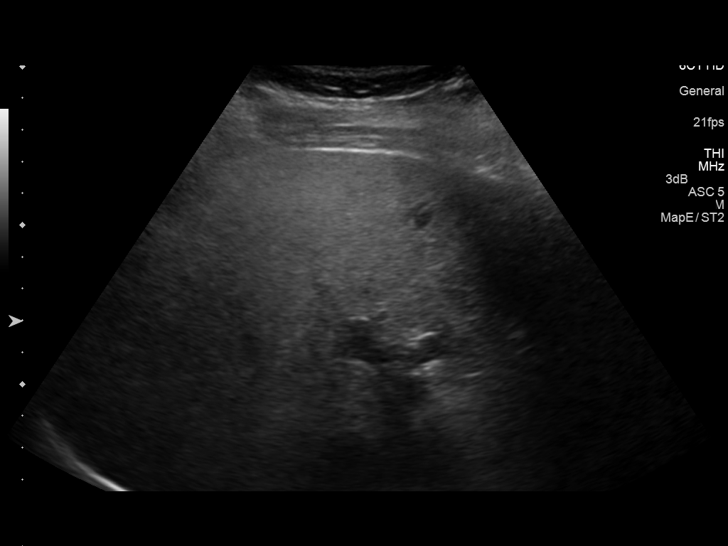
[im 38/38]
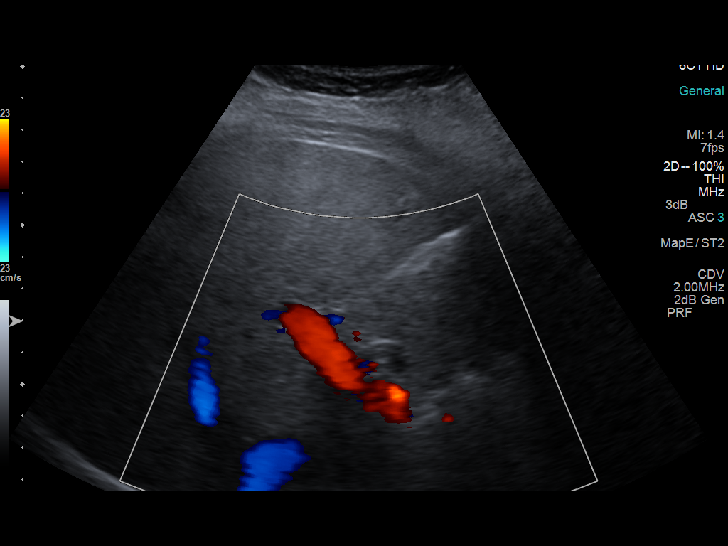

[14 of 25 positions shown; findings below may reference images not displayed]

FINDINGS: Gallbladder:

Cholecystectomy.

Common bile duct:

Diameter: 9 mm. Mild prominence of the common bile duct can be seen
following cholecystectomy. Biliary obstruction cannot be entirely
excluded. No intrahepatic biliary distention present.

Liver:

Liver is echogenic consistent fatty infiltration and/or
hepatocellular disease. No focal hepatic abnormality identified.
IMPRESSION: 1. Cholecystectomy.
2. The common bile duct is mildly prominent at 9 mm. This can be
seen following cholecystectomy.
3. Liver is echogenic consistent with fatty infiltration and/or
hepatocellular disease.

## 2017-05-21 ENCOUNTER — Other Ambulatory Visit: Payer: Self-pay | Admitting: Family

## 2017-06-04 ENCOUNTER — Other Ambulatory Visit: Payer: Self-pay | Admitting: Family

## 2017-06-12 ENCOUNTER — Ambulatory Visit (INDEPENDENT_AMBULATORY_CARE_PROVIDER_SITE_OTHER): Payer: PPO | Admitting: Family

## 2017-06-12 ENCOUNTER — Encounter: Payer: Self-pay | Admitting: Family

## 2017-06-12 VITALS — BP 112/75 | HR 58 | Temp 97.5°F | Ht 66.0 in | Wt 180.0 lb

## 2017-06-12 DIAGNOSIS — L03114 Cellulitis of left upper limb: Secondary | ICD-10-CM

## 2017-06-12 DIAGNOSIS — S50362A Insect bite (nonvenomous) of left elbow, initial encounter: Secondary | ICD-10-CM

## 2017-06-12 DIAGNOSIS — W57XXXA Bitten or stung by nonvenomous insect and other nonvenomous arthropods, initial encounter: Secondary | ICD-10-CM | POA: Diagnosis not present

## 2017-06-12 MED ORDER — DOXYCYCLINE HYCLATE 100 MG PO TABS: 100.0000 mg | ORAL_TABLET | Freq: Two times a day (BID) | ORAL | 0 refills | Status: DC

## 2017-06-12 MED ORDER — CEFTRIAXONE SODIUM 1 G IJ SOLR
1.0000 g | Freq: Once | INTRAMUSCULAR | Status: AC
  Administered 2017-06-12: 1 g via INTRAMUSCULAR

## 2017-06-12 NOTE — Progress Notes (Signed)
   Subjective:    Patient ID: Laura James, female    DOB: January 05, 1951, 67 y.o.   MRN: [id number]  Chief Complaint  Patient presents with  . left arm swelling and itching    Rash  This is a new problem. The current episode started yesterday. The problem has been gradually worsening since onset. The affected locations include the left elbow. The rash is characterized by itchiness, redness and swelling. She was exposed to an insect bite/sting. Pertinent negatives include no congestion, cough, diarrhea, fatigue, fever, joint pain, shortness of breath, sore throat or vomiting.      Review of Systems  Constitutional: Negative for fatigue and fever.  HENT: Negative for congestion and sore throat.   Respiratory: Negative for cough and shortness of breath.   Gastrointestinal: Negative for diarrhea and vomiting.  Musculoskeletal: Negative for joint pain.  Skin: Positive for rash.  All other systems reviewed and are negative.      Objective:   Physical Exam  Constitutional: She is oriented to person, place, and time. She appears well-developed and well-nourished. No distress.  HENT:  Head: Normocephalic and atraumatic.  Eyes: Pupils are equal, round, and reactive to light.  Neck: Normal range of motion. Neck supple. No thyromegaly present.  Cardiovascular: Normal rate, regular rhythm, normal heart sounds and intact distal pulses.  No murmur heard. Pulmonary/Chest: Effort normal and breath sounds normal. No respiratory distress. She has no wheezes.  Abdominal: Soft. Bowel sounds are normal. She exhibits no distension. There is no tenderness.  Musculoskeletal: Normal range of motion. She exhibits no edema or tenderness.  Neurological: She is alert and oriented to person, place, and time. She has normal reflexes. No cranial nerve deficit.  Skin: Skin is warm and dry. There is erythema.  Left elbow erythemas, warmth, tenderness approx 4.4X6.8 cm. Area marked  Psychiatric: She has a normal  mood and affect. Her behavior is normal. Judgment and thought content normal.  Vitals reviewed.     BP 112/75   Pulse (!) 58   Temp (!) 97.5 F (36.4 C) (Oral)   Ht 5\' 6"  (1.676 m)   Wt 180 lb (81.6 kg)   BMI 29.05 kg/m      Assessment & Plan:  Keirstan was seen today for left arm swelling and itching.  Diagnoses and all orders for this visit:  Insect bite of left elbow, initial encounter -     cefTRIAXone (ROCEPHIN) injection 1 g -     doxycycline (VIBRA-TABS) 100 MG tablet; Take 1 tablet (100 mg total) by mouth 2 (two) times daily.  Cellulitis of left upper extremity -     cefTRIAXone (ROCEPHIN) injection 1 g -     doxycycline (VIBRA-TABS) 100 MG tablet; Take 1 tablet (100 mg total) by mouth 2 (two) times daily.   Elevate when possible Area marked- Report any increase in redness, tenderness, or swelling Do not scratch  RTO in 10 days if area is not improved   Evelina Dun, FNP

## 2017-06-12 NOTE — Patient Instructions (Signed)

## 2017-07-21 IMAGING — DX DG HIP (WITH OR WITHOUT PELVIS) 2-3V*L*
3 series · 3 of 3 positions shown · non-contrast
Comparison: AP view of both hips January 09, 2014

CLINICAL DATA: Progressively increasing pain in the left hip over
the past 4 weeks without known injury

EXAM:
DG HIP (WITH OR WITHOUT PELVIS) 2-3V LEFT

[pelvis ap]
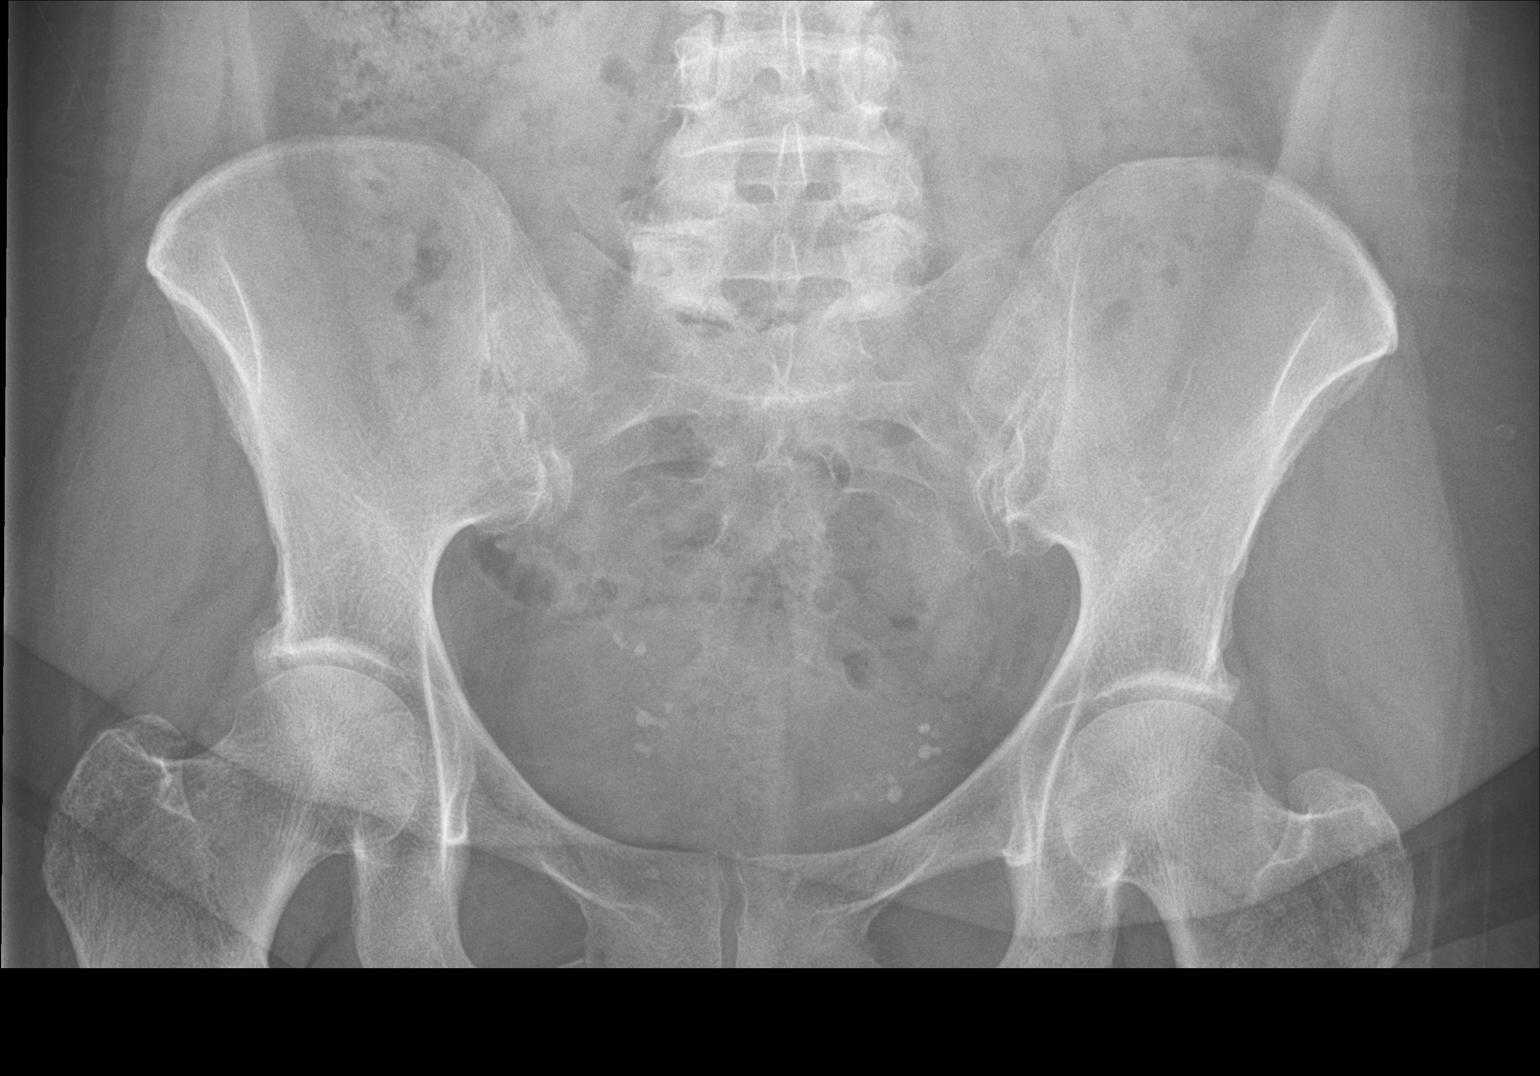

[hip ap]
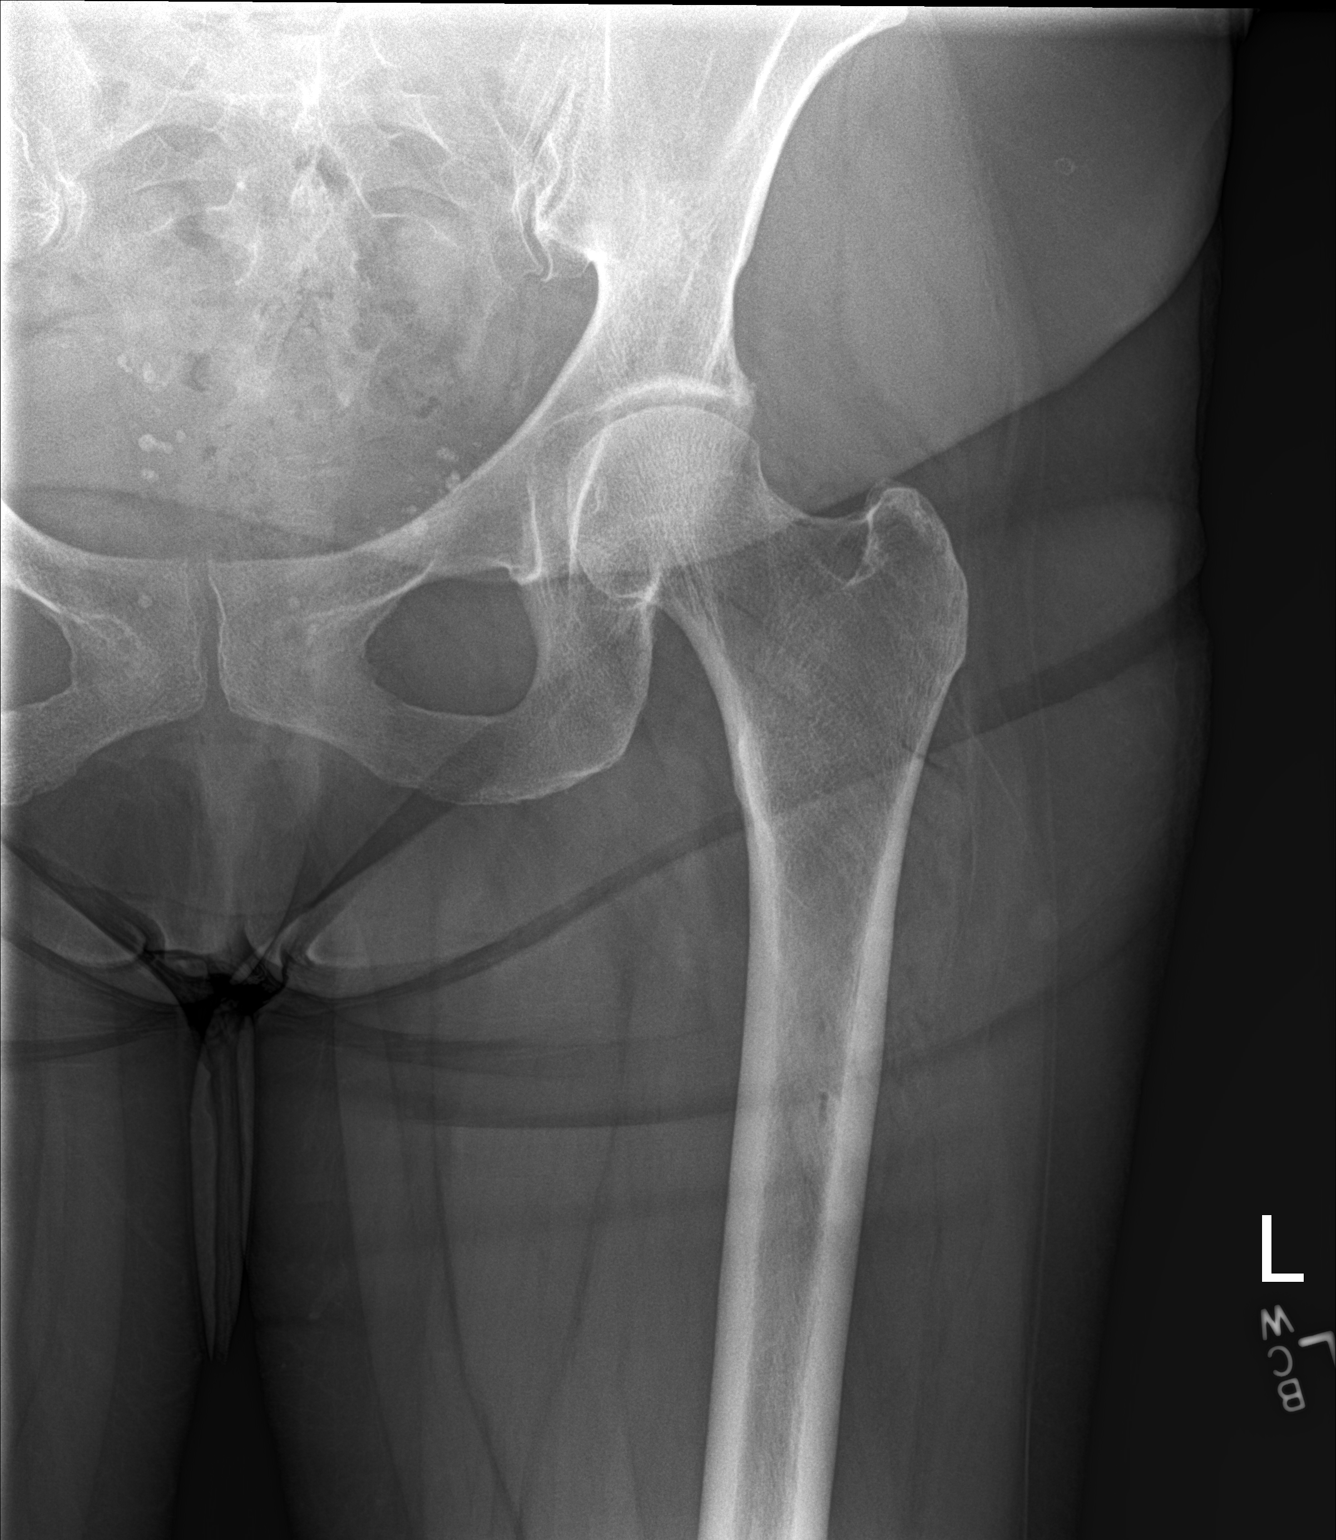

[hip lat]
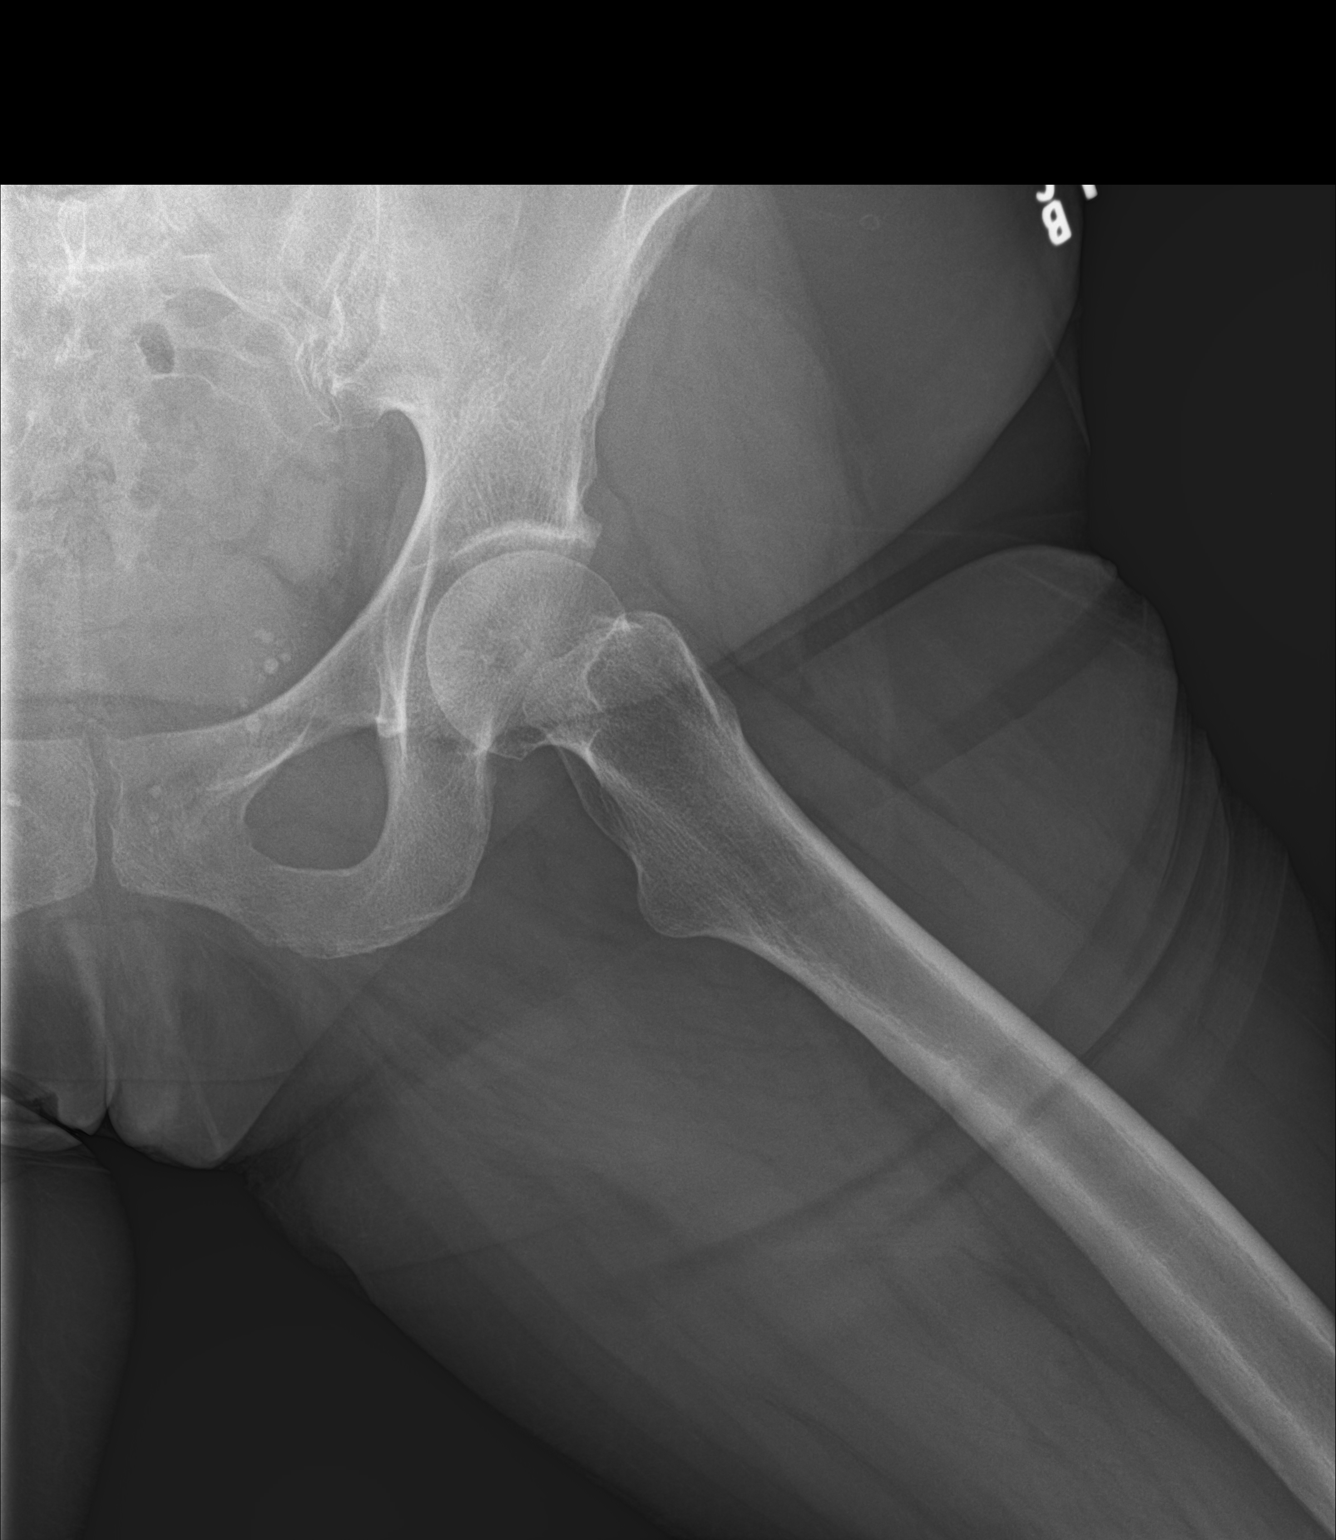

[3 of 3 positions shown; findings below may reference images not displayed]

FINDINGS: The bony pelvis is subjectively adequately mineralized. There is no
acute or healing fracture. There is no lytic nor blastic lesion. AP
and lateral views of the left hip reveal preservation of the joint
space. The articular surfaces of the femoral head and acetabulum
remains smoothly rounded. The femoral neck, intertrochanteric, and
subtrochanteric regions are normal.
IMPRESSION: There is no acute or chronic bony abnormality of the left hip.

## 2017-08-19 ENCOUNTER — Other Ambulatory Visit: Payer: Self-pay | Admitting: Family

## 2017-09-01 ENCOUNTER — Other Ambulatory Visit: Payer: Self-pay | Admitting: Family

## 2017-10-06 ENCOUNTER — Other Ambulatory Visit: Payer: Self-pay | Admitting: Family

## 2017-10-24 DIAGNOSIS — H25813 Combined forms of age-related cataract, bilateral: Secondary | ICD-10-CM | POA: Diagnosis not present

## 2017-10-24 DIAGNOSIS — H04123 Dry eye syndrome of bilateral lacrimal glands: Secondary | ICD-10-CM | POA: Diagnosis not present

## 2017-10-24 DIAGNOSIS — H35371 Puckering of macula, right eye: Secondary | ICD-10-CM | POA: Diagnosis not present

## 2017-10-24 DIAGNOSIS — H43813 Vitreous degeneration, bilateral: Secondary | ICD-10-CM | POA: Diagnosis not present

## 2017-10-25 ENCOUNTER — Encounter: Payer: Self-pay | Admitting: Family

## 2017-10-25 ENCOUNTER — Ambulatory Visit (INDEPENDENT_AMBULATORY_CARE_PROVIDER_SITE_OTHER): Payer: PPO | Admitting: Family

## 2017-10-25 VITALS — BP 114/65 | HR 55 | Temp 97.3°F | Ht 66.0 in | Wt 177.6 lb

## 2017-10-25 DIAGNOSIS — W57XXXA Bitten or stung by nonvenomous insect and other nonvenomous arthropods, initial encounter: Secondary | ICD-10-CM | POA: Diagnosis not present

## 2017-10-25 DIAGNOSIS — S50361A Insect bite (nonvenomous) of right elbow, initial encounter: Secondary | ICD-10-CM | POA: Diagnosis not present

## 2017-10-25 MED ORDER — ATENOLOL 25 MG PO TABS: 25.0000 mg | ORAL_TABLET | Freq: Every day | ORAL | 0 refills | Status: DC

## 2017-10-25 MED ORDER — HYDROXYZINE PAMOATE 25 MG PO CAPS: 25.0000 mg | ORAL_CAPSULE | Freq: Three times a day (TID) | ORAL | 2 refills | Status: DC | PRN

## 2017-10-25 MED ORDER — DOXYCYCLINE HYCLATE 100 MG PO TABS: 100.0000 mg | ORAL_TABLET | Freq: Two times a day (BID) | ORAL | 0 refills | Status: DC

## 2017-10-25 NOTE — Patient Instructions (Signed)
Insect Bite, Adult An insect bite can make your skin red, itchy, and swollen. An insect bite is different from an insect sting, which happens when an insect injects poison (venom) into the skin. Some insects can spread disease to people through a bite. However, most insect bites do not lead to disease and are not serious. What are the causes? Insects may bite for a variety of reasons, including:  Hunger.  To defend themselves.  Insects that bite include:  Spiders.  Mosquitoes.  Ticks.  Fleas.  Ants.  Flies.  Bedbugs.  What are the signs or symptoms? Symptoms of this condition include:  Itching or pain in the bite area.  Redness and swelling in the bite area.  An open wound (skin ulcer).  In many cases, symptoms last for 2-4 days. How is this diagnosed? This condition is usually diagnosed based on symptoms and a physical exam. How is this treated? Treatment is usually not needed. Symptoms often go away on their own. When treatment is recommended, it may involve:  Applying a cream or lotion to the bitten area. This treatment helps with itching.  Taking an antibiotic medicine. This treatment is needed if the bite area gets infected.  Getting a tetanus shot.  Applying ice to the affected area.  Medicines called antihistamines. This treatment is needed if you develop an allergic reaction to the insect bite.  Follow these instructions at home: Bite area care  Do not scratch the bite area.  Keep the bite area clean and dry. Wash it every day with soap and water as told by your health care provider.  Check the bite area every day for signs of infection. Check for: ? More redness, swelling, or pain. ? Fluid or blood. ? Warmth. ? Pus. Managing pain, itching, and swelling   You may apply a baking soda paste, cortisone cream, or calamine lotion to the bite area as told by your health care provider.  If directed, applyice to the bite area. ? Put ice in a  plastic bag. ? Place a towel between your skin and the bag. ? Leave the ice on for 20 minutes, 2-3 times per day. Medicines  Apply or take over-the-counter and prescription medicines only as told by your health care provider.  If you were prescribed an antibiotic medicine, use it as told by your health care provider. Do not stop using the antibiotic even if your condition improves. General instructions  Keep all follow-up visits as told by your health care provider. This is important. How is this prevented? To help reduce your risk of insect bites:  When you are outdoors, wear clothing that covers your arms and legs.  Use insect repellent. The best insect repellents contain: ? DEET, picaridin, oil of lemon eucalyptus (OLE), or IR3535. ? Higher amounts of an active ingredient.  If your home windows do not have screens, consider installing them.  Contact a health care provider if:  You have more redness, swelling, or pain in the bite area.  You have fluid, blood, or pus coming from the bite area.  The bite area feels warm to the touch.  You have a fever. Get help right away if:  You have joint pain.  You have a rash.  You have shortness of breath.  You feel unusually tired or sleepy.  You have neck pain.  You have a headache.  You have unusual weakness.  You have chest pain.  You have nausea, vomiting, or pain in the abdomen. This   information is not intended to replace advice given to you by your health care provider. Make sure you discuss any questions you have with your health care provider. Document Released: 02/17/2004 Document Revised: 09/08/2015 Document Reviewed: 07/19/2015 Elsevier Interactive Patient Education  2018 [employer] Inc[employer]  

## 2017-10-25 NOTE — Progress Notes (Signed)
   Subjective:    Patient ID: Laura James, female    DOB: 1950/06/28, 67 y.o.   MRN: [id number]  Chief Complaint  Patient presents with  . two bites near right elbow    HPI PT presents to the office today with erythemas, heat, swelling, and tenderness of her right elbow that she noticed last night. She states she never felt a bite, but the same thing happened on 06/12/17 on her left elbow.   She states she was outside watering flowers earlier in the day.    Review of Systems  Skin: Positive for rash.  All other systems reviewed and are negative.      Objective:   Physical Exam  Constitutional: She is oriented to person, place, and time. She appears well-developed and well-nourished. No distress.  HENT:  Head: Normocephalic.  Eyes: Pupils are equal, round, and reactive to light.  Cardiovascular: Normal rate, regular rhythm, normal heart sounds and intact distal pulses.  No murmur heard. Pulmonary/Chest: Effort normal and breath sounds normal. No respiratory distress. She has no wheezes.  Abdominal: Soft. Bowel sounds are normal. She exhibits no distension. There is no tenderness.  Musculoskeletal: Normal range of motion. She exhibits no edema or tenderness.  Neurological: She is alert and oriented to person, place, and time. She has normal reflexes. No cranial nerve deficit.  Skin: Skin is warm and dry. Rash noted. There is erythema.     Psychiatric: She has a normal mood and affect. Her behavior is normal. Judgment and thought content normal.  Vitals reviewed.     BP 114/65   Pulse (!) 55   Temp (!) 97.3 F (36.3 C) (Oral)   Ht 5\' 6"  (1.676 m)   Wt 177 lb 9.6 oz (80.6 kg)   BMI 28.67 kg/m      Assessment & Plan:  Laura James comes in today with chief complaint of two bites near right elbow   Diagnosis and orders addressed:  1. Insect bite of right elbow, initial encounter Keep elevated Report increased redness, swelling, or fevers Do not scratch -  doxycycline (VIBRA-TABS) 100 MG tablet; Take 1 tablet (100 mg total) by mouth 2 (two) times daily.  Dispense: 20 tablet; Refill: 0 - hydrOXYzine (VISTARIL) 25 MG capsule; Take 1 capsule (25 mg total) by mouth 3 (three) times daily as needed.  Dispense: 60 capsule; Refill: [place], FNP

## 2017-11-01 ENCOUNTER — Ambulatory Visit (INDEPENDENT_AMBULATORY_CARE_PROVIDER_SITE_OTHER): Payer: PPO | Admitting: *Deleted

## 2017-11-01 ENCOUNTER — Encounter: Payer: Self-pay | Admitting: *Deleted

## 2017-11-01 VITALS — BP 99/63 | HR 57 | Ht 64.0 in | Wt 177.0 lb

## 2017-11-01 DIAGNOSIS — Z23 Encounter for immunization: Secondary | ICD-10-CM

## 2017-11-01 DIAGNOSIS — Z0000 Encounter for general adult medical examination without abnormal findings: Secondary | ICD-10-CM | POA: Diagnosis not present

## 2017-11-01 NOTE — Patient Instructions (Signed)
  Laura James , Thank you for taking time to come for your Medicare Wellness Visit. I appreciate your ongoing commitment to your health goals. Please review the following plan we discussed and let me know if I can assist you in the future.   These are the goals we discussed: Goals    . Exercise 3x per week (30 min per time)     Increase walking to 3 times per week for 30 minutes each session       This is a list of the screening recommended for you and due dates:  Health Maintenance  Topic Date Due  . Pneumonia vaccines (2 of 2 - PPSV23) 01/18/2017  . Flu Shot  06/12/2018*  . DEXA scan (bone density measurement)  08/03/2018  . Mammogram  11/20/2018  . Colon Cancer Screening  11/29/2020  . Tetanus Vaccine  12/24/2023  .  Hepatitis C: One time screening is recommended by Center for Disease Control  (CDC) for  adults born from [birth year] through 1967.   Completed  . Pap Smear  Discontinued  *Topic was postponed. The date shown is not the original due date.

## 2017-11-01 NOTE — Progress Notes (Addendum)
Subjective:   Laura James is a 67 y.o. female who presents for a Medicare Annual Wellness Visit. Laura James lives at home with her husband. Her daughter and granddaughter live behind them and come over frequently for supper. They have one other daughter and one son and five grandchildren that live in [place]. 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 She is also an Dentist at [employer].    Review of Systems    Patient reports that her overall health is better compared to last year.  Cardiac Risk Factors include: advanced age (>66men, >106 women);hypertension;sedentary lifestyle  She started Weight Watcher's at the beginning of the year and has lost 22 pounds.   All other systems negative       Current Medications (verified) Outpatient Encounter Medications as of 11/01/2017  Medication Sig  . albuterol (PROVENTIL HFA;VENTOLIN HFA) 108 (90 Base) MCG/ACT inhaler Inhale 2 puffs into the lungs every 6 (six) hours as needed for wheezing or shortness of breath.  Marland Kitchen aspirin 81 MG tablet Take 81 mg by mouth daily.    Marland Kitchen atenolol (TENORMIN) 25 MG tablet Take 1 tablet (25 mg total) by mouth daily.  . cetirizine (ZYRTEC) 10 MG tablet Take 10 mg by mouth daily as needed.   . cholecalciferol (VITAMIN D) 1000 units tablet Take 1 tablet (1,000 Units total) by mouth daily.  . Cyanocobalamin (B-12) 3000 MCG CAPS Take 1 capsule by mouth daily.  Marland Kitchen doxycycline (VIBRA-TABS) 100 MG tablet Take 1 tablet (100 mg total) by mouth 2 (two) times daily.  . fluticasone (FLONASE) 50 MCG/ACT nasal spray Place 2 sprays into both nostrils daily. (Patient taking differently: Place 2 sprays into both nostrils daily as needed. )  . hydrochlorothiazide (HYDRODIURIL) 25 MG tablet TAKE 1 TABLET BY MOUTH EVERY DAY  . hydrOXYzine (VISTARIL) 25 MG capsule Take 1 capsule (25 mg total) by mouth 3 (three) times daily as  needed.  Marland Kitchen ibuprofen (ADVIL,MOTRIN) 100 MG/5ML suspension Take 200-400 mg by mouth 2 (two) times daily as needed.  . Omeprazole (PRILOSEC PO) Take by mouth.   No facility-administered encounter medications on file as of 11/01/2017.     Allergies (verified) Adhesive [tape] and Latex   History: Past Medical History:  Diagnosis Date  . Barrett esophagus   . 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 Depression   . Diverticulosis of colon (without mention of hemorrhage)   . Esophageal reflux   . Essential hypertension, benign   . Gastritis   . Grave's disease   . Hiatal hernia   . Hypothyroidism   . IBS (irritable bowel syndrome)   . Obesity   . Osteopenia   . Sleep apnea   . Symptomatic menopausal or female climacteric states    Past Surgical History:  Procedure Laterality Date  . ABDOMINAL HYSTERECTOMY N/A   . CHOLECYSTECTOMY    . INCONTINENCE SURGERY    . TONSILLECTOMY     Family History  Problem Relation Age of Onset  . Lung cancer Father   . Hypertension Mother   . Arthritis Mother   . Osteoporosis Mother   . Kidney disease Mother        related to APAP use  . Diabetes Unknown   . Clotting disorder Unknown   . Breast cancer Unknown        aunt  . Hypertension Brother   .  Hypertension Daughter   . Diabetes Son   . Diabetes Daughter   . Colon cancer Neg Hx    Social History   Socioeconomic History  . Marital status: Married    Spouse name: Not on file  . Number of children: 3  . Years of education: 65  . Highest education level: Master's degree (e.g., MA, MS, MEng, MEd, MSW, MBA)  Occupational History  . Occupation: Hospice- Chaplain    Comment: [employer]  . Financial resource strain: Not hard at all  . Food insecurity:    Worry: Never true    Inability: Never true  . Transportation needs:    Medical: No    Non-medical: No  Tobacco Use  . Smoking status: Former Smoker    Packs/day:  0.50    Years: 5.00    Pack years: 2.50    Types: Cigarettes    Last attempt to quit: 01/24/1968    Years since quitting: 49.8  . Smokeless tobacco: Never Used  Substance and Sexual Activity  . Alcohol use: No    Alcohol/week: 0.0 standard drinks  . Drug use: No  . Sexual activity: Yes  Lifestyle  . Physical activity:    Days per week: 0 days    Minutes per session: 0 min  . Stress: Not at all  Relationships  . Social connections:    Talks on phone: More than three times a week    Gets together: More than three times a week    Attends religious service: More than 4 times per year    Active member of club or organization: Yes    Attends meetings of clubs or organizations: More than 4 times per year    Relationship status: Married  Other Topics Concern  . Not on file  Social History Narrative   Lives at home with husband and granddaughter.   They are rasing a great grandson.      Tobacco Use No.  Clinical Intake:  Pre-visit preparation completed: No  Pain : No/denies pain     Nutritional Status: BMI 25 -29 Overweight Diabetes: No  How often do you need to have someone help you when you read instructions, pamphlets, or other written materials from your doctor or pharmacy?: 1 - Never What is the last grade level you completed in school?: master's degree in divinity     Information entered by :: Chong Sicilian, RN   Activities of Daily Living In your present state of health, do you have any difficulty performing the following activities: 11/01/2017  Hearing? N  Vision? N  Difficulty concentrating or making decisions? N  Walking or climbing stairs? N  Dressing or bathing? N  Doing errands, shopping? N  Preparing Food and eating ? N  Using the Toilet? N  In the past six months, have you accidently leaked urine? N  Do you have problems with loss of bowel control? N  Managing your Medications? N  Managing your Finances? N  Housekeeping or managing your  Housekeeping? N  Some recent data might be hidden     Diet Member of Weight Watcher's Stopped eating McDonald's biscuits and sweet tea everyday Breakfast-Eats a large breakfast: Grits, eggs, tomatoes Lunch-bag of weight watcher chips and a slice of cheese or fruit Supper-Mostly at home and is careful about what she eats  Exercise Current Exercise Habits: The patient does not participate in regular exercise at present, Exercise limited by: None identified   Depression Screen PHQ  2/9 Scores 11/01/2017 10/25/2017 06/12/2017 01/29/2017 01/11/2017 10/12/2016 08/11/2016  PHQ - 2 Score 0 0 0 0 0 0 0     Fall Risk Fall Risk  11/01/2017 10/25/2017 06/12/2017 01/29/2017 01/11/2017  Falls in the past year? No No No No No    Safety Is the patient's home free of loose throw rugs in walkways, pet beds, electrical cords, etc?   yes       Handrails on the stairs?   yes      Adequate lighting?   yes  Patient Care Team: Sharion Balloon, FNP as PCP - General (Family Medicine)  Hospitalizations, surgeries, and ER visits in previous 12 months No hospitalizations, ER visits, or surgeries this past year.   Objective:    Today's Vitals   11/01/17 1011  BP: 99/63  Pulse: (!) 57  Weight: 177 lb (80.3 kg)  Height: 5\' 4"  (1.626 m)   Body mass index is 30.38 kg/m.  Advanced Directives 11/01/2017 08/02/2016 12/27/2015 04/02/2014  Does Patient Have a Medical Advance Directive? No No No No  Would patient like information on creating a medical advance directive? No - Patient declined Yes (ED - Information included in AVS) - -    Hearing/Vision  No hearing or vision deficits noted during visit.  Cognitive Function: MMSE - Mini Mental State Exam 11/01/2017 08/02/2016  Orientation to time 5 5  Orientation to Place 5 5  Registration 3 3  Attention/ Calculation 5 5  Recall 3 3  Language- name 2 objects 2 2  Language- repeat 1 1  Language- follow 3 step command 3 3  Language- read & follow direction 1  1  Write a sentence 1 1  Copy design 1 1  Total score 30 30       Normal Cognitive Function Screening: Yes    Immunizations and Health Maintenance Immunization History  Administered Date(s) Administered  . Influenza, High Dose Seasonal PF 01/19/2016, 11/24/2016  . Influenza-Unspecified 11/23/2013  . Pneumococcal Conjugate-13 01/19/2016  . Pneumococcal Polysaccharide-23 11/01/2017  . Tdap 12/23/2013  . Zoster 06/28/2015   There are no preventive care reminders to display for this patient. Health Maintenance  Topic Date Due  . INFLUENZA VACCINE  06/12/2018 (Originally 08/23/2017)  . DEXA SCAN  08/03/2018  . MAMMOGRAM  11/20/2018  . COLONOSCOPY  11/29/2020  . TETANUS/TDAP  12/24/2023  . Hepatitis C Screening  Completed  . PNA vac Low Risk Adult  Completed  . PAP SMEAR  Discontinued        Assessment:   This is a routine wellness examination for Laura James.    Plan:    Goals    . Exercise 3x per week (30 min per time)     Increase walking to 3 times per week for 30 minutes each session        Health Maintenance Recommendations: Pneumococcal vaccine  Influenza vaccine   Additional Screening Recommendations: Lung: Low Dose CT Chest recommended if Age 65-80 years, 30 pack-year currently smoking OR have quit w/in 15years. Patient does not qualify. Hepatitis C Screening recommended: no  Today's Orders Orders Placed This Encounter  Procedures  . Pneumococcal polysaccharide vaccine 23-valent greater than or equal to 2yo subcutaneous/IM  Given today  Keep f/u with Sharion Balloon, FNP and any other specialty appointments you may have Continue current medications Move carefully to avoid falls. Use assistive devices like a cane or walker if needed. Aim for at least 150 minutes of moderate activity a week. This can  be done with chair exercises if necessary. Read or work on puzzles daily Stay connected with friends and family  I have personally reviewed and noted the  following in the patient's chart:   . Medical and social history . Use of alcohol, tobacco or illicit drugs  . Current medications and supplements . Functional ability and status . Nutritional status . Physical activity . Advanced directives . List of other physicians . Hospitalizations, surgeries, and ER visits in previous 12 months . Vitals . Screenings to include cognitive, depression, and falls . Referrals and appointments  In addition, I have reviewed and discussed with patient certain preventive protocols, quality metrics, and best practice recommendations. A written personalized care plan for preventive services as well as general preventive health recommendations were provided to patient.     Chong Sicilian, RN   11/01/2017    I have reviewed and agree with the above AWV documentation.   Evelina Dun, FNP

## 2017-11-10 ENCOUNTER — Other Ambulatory Visit: Payer: Self-pay | Admitting: Family

## 2017-11-29 DIAGNOSIS — H268 Other specified cataract: Secondary | ICD-10-CM | POA: Diagnosis not present

## 2017-11-29 DIAGNOSIS — H2511 Age-related nuclear cataract, right eye: Secondary | ICD-10-CM | POA: Diagnosis not present

## 2018-01-24 ENCOUNTER — Telehealth: Payer: Self-pay | Admitting: *Deleted

## 2018-01-24 ENCOUNTER — Other Ambulatory Visit: Payer: Self-pay | Admitting: Family

## 2018-01-24 MED ORDER — ATENOLOL 25 MG PO TABS: 25.0000 mg | ORAL_TABLET | Freq: Every day | ORAL | 0 refills | Status: DC

## 2018-01-24 NOTE — Telephone Encounter (Signed)
Pt needing RF on atenolol Appt made for 02/07/18 Aware 30 day RF sent to [facility]

## 2018-02-07 ENCOUNTER — Ambulatory Visit (INDEPENDENT_AMBULATORY_CARE_PROVIDER_SITE_OTHER): Payer: Medicare Other | Admitting: Family

## 2018-02-07 ENCOUNTER — Encounter: Payer: Self-pay | Admitting: Family

## 2018-02-07 VITALS — BP 120/72 | HR 55 | Temp 97.5°F | Ht 64.0 in | Wt 178.0 lb

## 2018-02-07 DIAGNOSIS — M150 Primary generalized (osteo)arthritis: Secondary | ICD-10-CM | POA: Diagnosis not present

## 2018-02-07 DIAGNOSIS — G4733 Obstructive sleep apnea (adult) (pediatric): Secondary | ICD-10-CM

## 2018-02-07 DIAGNOSIS — K219 Gastro-esophageal reflux disease without esophagitis: Secondary | ICD-10-CM

## 2018-02-07 DIAGNOSIS — M159 Polyosteoarthritis, unspecified: Secondary | ICD-10-CM

## 2018-02-07 DIAGNOSIS — R5383 Other fatigue: Secondary | ICD-10-CM

## 2018-02-07 DIAGNOSIS — Z0000 Encounter for general adult medical examination without abnormal findings: Secondary | ICD-10-CM

## 2018-02-07 DIAGNOSIS — Z0001 Encounter for general adult medical examination with abnormal findings: Secondary | ICD-10-CM

## 2018-02-07 DIAGNOSIS — I10 Essential (primary) hypertension: Secondary | ICD-10-CM | POA: Diagnosis not present

## 2018-02-07 MED ORDER — ATENOLOL 25 MG PO TABS: 25.0000 mg | ORAL_TABLET | Freq: Every day | ORAL | 2 refills | Status: DC

## 2018-02-07 MED ORDER — OMEPRAZOLE 20 MG PO CPDR: 20.0000 mg | DELAYED_RELEASE_CAPSULE | Freq: Every day | ORAL | 3 refills | Status: DC

## 2018-02-07 NOTE — Progress Notes (Signed)
 Subjective:    Patient ID: Laura James, female    DOB: 02/07/1950, 67 y.o.   MRN: [id number]  Chief Complaint  Patient presents with  . Medical Management of Chronic Issues   Pt presents to the office today for CPE.  Hypertension  This is a chronic problem. The current episode started more than 1 year ago. The problem has been resolved since onset. The problem is controlled. Associated symptoms include malaise/fatigue ("some times"). Pertinent negatives include no headaches, peripheral edema or shortness of breath. Risk factors for coronary artery disease include sedentary lifestyle and family history. The current treatment provides moderate improvement. There is no history of kidney disease, CAD/MI, CVA or heart failure.  Gastroesophageal Reflux  She complains of heartburn. She reports no belching or no coughing. This is a chronic problem. The current episode started more than 1 year ago. The problem occurs occasionally. She has tried a PPI for the symptoms. The treatment provided moderate relief.  Arthritis  Presents for follow-up visit. She complains of pain and stiffness. The symptoms have been stable. Affected locations include the left shoulder and right shoulder. Her pain is at a severity of 7/10.  OSA Pt states she does not sleep with CPAP. States she lost weight and states she feels better. Last sleep study was 07/06/14.   Review of Systems  Constitutional: Positive for malaise/fatigue ("some times").  Respiratory: Negative for cough and shortness of breath.   Gastrointestinal: Positive for heartburn.  Musculoskeletal: Positive for arthritis and stiffness.  Neurological: Negative for headaches.  All other systems reviewed and are negative.      Objective:   Physical Exam Vitals signs reviewed.  Constitutional:      General: She is not in acute distress.    Appearance: She is well-developed.  HENT:     Head: Normocephalic and atraumatic.     Right Ear: Tympanic  membrane normal.     Left Ear: Tympanic membrane normal.  Eyes:     Pupils: Pupils are equal, round, and reactive to light.  Neck:     Musculoskeletal: Normal range of motion and neck supple.     Thyroid: No thyromegaly.  Cardiovascular:     Rate and Rhythm: Normal rate and regular rhythm.     Heart sounds: Normal heart sounds. No murmur.  Pulmonary:     Effort: Pulmonary effort is normal. No respiratory distress.     Breath sounds: Normal breath sounds. No wheezing.  Abdominal:     General: Bowel sounds are normal. There is no distension.     Palpations: Abdomen is soft.     Tenderness: There is no abdominal tenderness.  Musculoskeletal: Normal range of motion.        General: No tenderness.  Skin:    General: Skin is warm and dry.  Neurological:     Mental Status: She is alert and oriented to person, place, and time.     Cranial Nerves: No cranial nerve deficit.     Deep Tendon Reflexes: Reflexes are normal and symmetric.  Psychiatric:        Behavior: Behavior normal.        Thought Content: Thought content normal.        Judgment: Judgment normal.       BP 120/72   Pulse (!) 55   Temp (!) 97.5 F (36.4 C) (Oral)   Ht 5' 4" (1.626 m)   Wt 178 lb (80.7 kg)   BMI 30.55 kg/m        Assessment & Plan:  Laura James comes in today with chief complaint of Medical Management of Chronic Issues   Diagnosis and orders addressed:  1. Annual physical exam - CMP14+EGFR - CBC with Differential/Platelet - Lipid panel - TSH  2. Essential hypertension, benign - CMP14+EGFR - CBC with Differential/Platelet - atenolol (TENORMIN) 25 MG tablet; Take 1 tablet (25 mg total) by mouth daily.  Dispense: 90 tablet; Refill: 2  3. Obstructive sleep apnea - CMP14+EGFR - CBC with Differential/Platelet - Ambulatory referral to Pulmonology  4. Gastroesophageal reflux disease without esophagitis - CMP14+EGFR - CBC with Differential/Platelet - omeprazole (PRILOSEC) 20 MG capsule;  Take 1 capsule (20 mg total) by mouth daily.  Dispense: 90 capsule; Refill: 3  5. Primary osteoarthritis involving multiple joints - CMP14+EGFR - CBC with Differential/Platelet  6. Other fatigue - CMP14+EGFR - CBC with Differential/Platelet - Ambulatory referral to Pulmonology   Labs pending Health Maintenance reviewed Diet and exercise encouraged  Follow up plan: 6 months   Evelina Dun, FNP

## 2018-02-07 NOTE — Patient Instructions (Addendum)

## 2018-02-08 ENCOUNTER — Other Ambulatory Visit: Payer: Self-pay | Admitting: Family

## 2018-02-08 LAB — CBC WITH DIFFERENTIAL/PLATELET
Basophils Absolute: 0 10*3/uL (ref 0.0–0.2)
Basos: 0 %
EOS (ABSOLUTE): 0.4 10*3/uL (ref 0.0–0.4)
Eos: 4 %
Hematocrit: 37.6 % (ref 34.0–46.6)
Hemoglobin: 12 g/dL (ref 11.1–15.9)
Immature Grans (Abs): 0 10*3/uL (ref 0.0–0.1)
Immature Granulocytes: 0 %
LYMPHS ABS: 3 10*3/uL (ref 0.7–3.1)
Lymphs: 35 %
MCH: 25.1 pg — ABNORMAL LOW (ref 26.6–33.0)
MCHC: 31.9 g/dL (ref 31.5–35.7)
MCV: 79 fL (ref 79–97)
Monocytes Absolute: 0.5 10*3/uL (ref 0.1–0.9)
Monocytes: 5 %
Neutrophils Absolute: 4.8 10*3/uL (ref 1.4–7.0)
Neutrophils: 56 %
Platelets: 365 10*3/uL (ref 150–450)
RBC: 4.78 x10E6/uL (ref 3.77–5.28)
RDW: 14.4 % (ref 11.7–15.4)
WBC: 8.7 10*3/uL (ref 3.4–10.8)

## 2018-02-08 LAB — CMP14+EGFR
A/G RATIO: 1.4 (ref 1.2–2.2)
ALT: 17 IU/L (ref 0–32)
AST: 22 IU/L (ref 0–40)
Albumin: 4 g/dL (ref 3.6–4.8)
Alkaline Phosphatase: 81 IU/L (ref 39–117)
BUN/Creatinine Ratio: 16 (ref 12–28)
BUN: 16 mg/dL (ref 8–27)
Bilirubin Total: <0.2 mg/dL (ref 0.0–1.2)
CO2: 23 mmol/L (ref 20–29)
Calcium: 10.1 mg/dL (ref 8.7–10.3)
Chloride: 99 mmol/L (ref 96–106)
Creatinine, Ser: 0.98 mg/dL (ref 0.57–1.00)
GFR calc Af Amer: 68 mL/min/{1.73_m2} (ref >59)
GFR calc non Af Amer: 59 mL/min/{1.73_m2} — ABNORMAL LOW (ref >59)
GLOBULIN, TOTAL: 2.9 g/dL (ref 1.5–4.5)
Glucose: 98 mg/dL (ref 65–99)
POTASSIUM: 4.1 mmol/L (ref 3.5–5.2)
Sodium: 139 mmol/L (ref 134–144)
Total Protein: 6.9 g/dL (ref 6.0–8.5)

## 2018-02-08 LAB — LIPID PANEL
Chol/HDL Ratio: 3.7 ratio (ref 0.0–4.4)
Cholesterol, Total: 186 mg/dL (ref 100–199)
HDL: 50 mg/dL (ref >39)
LDL Calculated: 119 mg/dL — ABNORMAL HIGH (ref 0–99)
Triglycerides: 87 mg/dL (ref 0–149)
VLDL Cholesterol Cal: 17 mg/dL (ref 5–40)

## 2018-02-08 LAB — TSH: TSH: 1.86 u[IU]/mL (ref 0.450–4.500)

## 2018-02-08 MED ORDER — ATORVASTATIN CALCIUM 10 MG PO TABS: 10.0000 mg | ORAL_TABLET | Freq: Every day | ORAL | 3 refills | Status: DC

## 2018-03-07 ENCOUNTER — Other Ambulatory Visit: Payer: Self-pay | Admitting: Family

## 2018-04-04 ENCOUNTER — Encounter: Payer: Self-pay | Admitting: Pulmonary Disease

## 2018-04-04 ENCOUNTER — Ambulatory Visit (INDEPENDENT_AMBULATORY_CARE_PROVIDER_SITE_OTHER): Payer: Medicare Other | Admitting: Pulmonary Disease

## 2018-04-04 DIAGNOSIS — G4733 Obstructive sleep apnea (adult) (pediatric): Secondary | ICD-10-CM | POA: Diagnosis not present

## 2018-04-04 NOTE — Assessment & Plan Note (Addendum)
moderate sleep apnea in 2016. Since you have lost 20 pounds, we will repeat home sleep study. Based on this, we will decide need for CPAP. Meanwhile CPAP supplies will be renewed, [facility] If she still has OSA she may require lower CPAP settings and we will change her to auto CPAP 10 to 15 cm  Weight loss encouraged, compliance with goal of at least 4-6 hrs every night is the expectation. Advised against medications with sedative side effects Cautioned against driving when sleepy - understanding that sleepiness will vary on a day to day basis

## 2018-04-04 NOTE — Progress Notes (Signed)
Subjective:    Patient ID: Laura James, female    DOB: 04/06/1950, 68 y.o.   MRN: [id number]  HPI  Chief Complaint  Patient presents with  . Sleep Consult    Referred by Evelina Dun NP for possible OSA. Has had SSs before.    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  OSA was diagnosed in 2016 when she presented with snoring and excessive daytime somnolence.  N PSG showed moderate OSA with severe in rem sleep with lowest desaturation 81%.  This was corrected by 15 cm of CPAP pressure with full facemask.  She was started on the settings and seemed to do well with good compliance noted on initial follow-up study.  Over the next few years she lost 20 pounds from her weight of 199 pounds to her current weight of 179 pounds and she stopped using her machine. She now reports sleepiness during the day, snoring has returned.  She seems to be able to manage on 4 hours of "good sleep".  Her best sleep is generally in the early morning hours. Epworth sleepiness score is 9 and she reports sleepiness while sitting and reading, as a passenger in a car or lying down to rest in the afternoons.  Bedtime is 11 PM, sleep latency can be up to 45 minutes, she sleeps on her side with 1-2 pillows, reports 2-3 nocturnal awakenings including nocturia.  She will sometimes wake up around 3:30 AM and be unable to fall asleep again but generally wake-up time otherwise at 6:45 AM, she wakes up feeling tired without dryness of mouth or headaches There is no history suggestive of cataplexy, sleep paralysis or parasomnias  Blood pressure is controlled with 2 medications, GERD symptoms are controlled with Prilosec   Significant tests/ events reviewed  03/2014 PSG AHI 16/hour, increase during REM sleep, lowest desaturation 81%, corrected by CPAP of 15 cm with a full facemask - 199 lbs    Past Medical History:  Diagnosis Date  . Barrett esophagus    . 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 Depression   . Diverticulosis of colon (without mention of hemorrhage)   . Esophageal reflux   . Essential hypertension, benign   . Gastritis   . Grave's disease   . Hiatal hernia   . Hypothyroidism   . IBS (irritable bowel syndrome)   . Obesity   . Osteopenia   . Sleep apnea   . Symptomatic menopausal or female climacteric states      Review of Systems  Constitutional: Positive for unexpected weight change. Negative for fever.  HENT: Positive for dental problem. Negative for congestion, ear pain, nosebleeds, postnasal drip, rhinorrhea, sinus pressure, sneezing, sore throat and trouble swallowing.   Eyes: Negative for redness and itching.  Respiratory: Negative for cough, chest tightness, shortness of breath and wheezing.   Cardiovascular: Negative for palpitations and leg swelling.  Gastrointestinal: Negative for nausea and vomiting.  Genitourinary: Negative for dysuria.  Musculoskeletal: Negative for joint swelling.  Skin: Negative for rash.  Allergic/Immunologic: Negative.  Negative for environmental allergies, food allergies and immunocompromised state.  Neurological: Negative for headaches.  Hematological: Does not bruise/bleed easily.  Psychiatric/Behavioral: Negative for dysphoric mood. The patient is not nervous/anxious.        Objective:   Physical Exam  Gen. Pleasant,  in no distress, normal affect ENT - no pallor,icterus, no post nasal drip, class 2 airway, normal  bite Neck: No JVD, no thyromegaly, no carotid bruits Lungs: no use of accessory muscles, no dullness to percussion, decreased without rales or rhonchi  Cardiovascular: Rhythm regular, heart sounds  normal, no murmurs or gallops, no peripheral edema Abdomen: soft and non-tender, no hepatosplenomegaly, BS normal. Musculoskeletal: No deformities, no cyanosis or clubbing Neuro:  alert, non focal, no tremors        Assessment & Plan:

## 2018-04-04 NOTE — Patient Instructions (Signed)
You had moderate sleep apnea in 2016. Since you have lost 20 pounds, we will repeat home sleep study. Based on this, we will decide need for CPAP. Meanwhile CPAP supplies will be renewed, [facility]

## 2018-04-12 ENCOUNTER — Other Ambulatory Visit: Payer: Self-pay

## 2018-04-12 DIAGNOSIS — G4733 Obstructive sleep apnea (adult) (pediatric): Secondary | ICD-10-CM

## 2018-04-18 ENCOUNTER — Other Ambulatory Visit: Payer: Self-pay | Admitting: *Deleted

## 2018-04-18 MED ORDER — ALBUTEROL SULFATE HFA 108 (90 BASE) MCG/ACT IN AERS: 2.0000 | INHALATION_SPRAY | Freq: Four times a day (QID) | RESPIRATORY_TRACT | 0 refills | Status: DC | PRN

## 2018-05-06 DIAGNOSIS — G4733 Obstructive sleep apnea (adult) (pediatric): Secondary | ICD-10-CM | POA: Diagnosis not present

## 2018-05-11 ENCOUNTER — Other Ambulatory Visit: Payer: Self-pay | Admitting: Family

## 2018-05-14 ENCOUNTER — Other Ambulatory Visit: Payer: Self-pay | Admitting: Family

## 2018-06-11 ENCOUNTER — Ambulatory Visit: Payer: Medicare Other

## 2018-06-11 ENCOUNTER — Other Ambulatory Visit: Payer: Self-pay

## 2018-06-11 DIAGNOSIS — G4733 Obstructive sleep apnea (adult) (pediatric): Secondary | ICD-10-CM

## 2018-06-21 ENCOUNTER — Telehealth: Payer: Self-pay | Admitting: Pulmonary Disease

## 2018-06-21 DIAGNOSIS — G4733 Obstructive sleep apnea (adult) (pediatric): Secondary | ICD-10-CM

## 2018-06-21 NOTE — Telephone Encounter (Signed)
Sleep study again showed mild OSA 13/hour but worse during supine sleep 32/hour Recommend continue CPAP but change to auto CPAP settings 10 to 15 cm

## 2018-06-21 NOTE — Telephone Encounter (Signed)
Called and spoke with pt letting her know the results of the HST and that RA said for pt to continue CPAP but we were going to change the settings and pt verbalized understanding. Order has been placed stating for pt to continue CPAP but to change the settings and [facility]. Nothing further needed.

## 2018-07-14 ENCOUNTER — Other Ambulatory Visit: Payer: Self-pay | Admitting: Family

## 2018-07-30 DIAGNOSIS — Z1159 Encounter for screening for other viral diseases: Secondary | ICD-10-CM | POA: Diagnosis not present

## 2018-08-07 ENCOUNTER — Other Ambulatory Visit: Payer: Self-pay | Admitting: Family

## 2018-08-08 ENCOUNTER — Encounter: Payer: Self-pay | Admitting: Family

## 2018-08-08 ENCOUNTER — Other Ambulatory Visit: Payer: Self-pay

## 2018-08-08 ENCOUNTER — Ambulatory Visit (INDEPENDENT_AMBULATORY_CARE_PROVIDER_SITE_OTHER): Payer: Medicare Other | Admitting: Family

## 2018-08-08 VITALS — BP 106/66 | HR 63 | Temp 97.9°F | Ht 64.0 in | Wt 183.0 lb

## 2018-08-08 DIAGNOSIS — R7989 Other specified abnormal findings of blood chemistry: Secondary | ICD-10-CM | POA: Diagnosis not present

## 2018-08-08 DIAGNOSIS — I10 Essential (primary) hypertension: Secondary | ICD-10-CM

## 2018-08-08 DIAGNOSIS — M150 Primary generalized (osteo)arthritis: Secondary | ICD-10-CM

## 2018-08-08 DIAGNOSIS — G4733 Obstructive sleep apnea (adult) (pediatric): Secondary | ICD-10-CM

## 2018-08-08 DIAGNOSIS — K219 Gastro-esophageal reflux disease without esophagitis: Secondary | ICD-10-CM | POA: Diagnosis not present

## 2018-08-08 DIAGNOSIS — E785 Hyperlipidemia, unspecified: Secondary | ICD-10-CM | POA: Insufficient documentation

## 2018-08-08 DIAGNOSIS — Z7689 Persons encountering health services in other specified circumstances: Secondary | ICD-10-CM | POA: Diagnosis not present

## 2018-08-08 DIAGNOSIS — M159 Polyosteoarthritis, unspecified: Secondary | ICD-10-CM

## 2018-08-08 NOTE — Patient Instructions (Signed)
Health Maintenance After Age 70 After age 70, you are at a higher risk for certain long-term diseases and infections as well as injuries from falls. Falls are a major cause of broken bones and head injuries in people who are older than age 70. Getting regular preventive care can help to keep you healthy and well. Preventive care includes getting regular testing and making lifestyle changes as recommended by your health care provider. Talk with your health care provider about:  Which screenings and tests you should have. A screening is a test that checks for a disease when you have no symptoms.  A diet and exercise plan that is right for you. What should I know about screenings and tests to prevent falls? Screening and testing are the best ways to find a health problem early. Early diagnosis and treatment give you the best chance of managing medical conditions that are common after age 70. Certain conditions and lifestyle choices may make you more likely to have a fall. Your health care provider may recommend:  Regular vision checks. Poor vision and conditions such as cataracts can make you more likely to have a fall. If you wear glasses, make sure to get your prescription updated if your vision changes.  Medicine review. Work with your health care provider to regularly review all of the medicines you are taking, including over-the-counter medicines. Ask your health care provider about any side effects that may make you more likely to have a fall. Tell your health care provider if any medicines that you take make you feel dizzy or sleepy.  Osteoporosis screening. Osteoporosis is a condition that causes the bones to get weaker. This can make the bones weak and cause them to break more easily.  Blood pressure screening. Blood pressure changes and medicines to control blood pressure can make you feel dizzy.  Strength and balance checks. Your health care provider may recommend certain tests to check your  strength and balance while standing, walking, or changing positions.  Foot health exam. Foot pain and numbness, as well as not wearing proper footwear, can make you more likely to have a fall.  Depression screening. You may be more likely to have a fall if you have a fear of falling, feel emotionally low, or feel unable to do activities that you used to do.  Alcohol use screening. Using too much alcohol can affect your balance and may make you more likely to have a fall. What actions can I take to lower my risk of falls? General instructions  Talk with your health care provider about your risks for falling. Tell your health care provider if: ? You fall. Be sure to tell your health care provider about all falls, even ones that seem minor. ? You feel dizzy, sleepy, or off-balance.  Take over-the-counter and prescription medicines only as told by your health care provider. These include any supplements.  Eat a healthy diet and maintain a healthy weight. A healthy diet includes low-fat dairy products, low-fat (lean) meats, and fiber from whole grains, beans, and lots of fruits and vegetables. Home safety  Remove any tripping hazards, such as rugs, cords, and clutter.  Install safety equipment such as grab bars in bathrooms and safety rails on stairs.  Keep rooms and walkways well-lit. Activity   Follow a regular exercise program to stay fit. This will help you maintain your balance. Ask your health care provider what types of exercise are appropriate for you.  If you need a cane or   walker, use it as recommended by your health care provider.  Wear supportive shoes that have nonskid soles. Lifestyle  Do not drink alcohol if your health care provider tells you not to drink.  If you drink alcohol, limit how much you have: ? 0-1 drink a day for women. ? 0-2 drinks a day for men.  Be aware of how much alcohol is in your drink. In the [place] one drink equals one typical bottle of beer (12  oz), one-half glass of wine (5 oz), or one shot of hard liquor (1 oz).  Do not use any products that contain nicotine or tobacco, such as cigarettes and e-cigarettes. If you need help quitting, ask your health care provider. Summary  Having a healthy lifestyle and getting preventive care can help to protect your health and wellness after age 70.  Screening and testing are the best way to find a health problem early and help you avoid having a fall. Early diagnosis and treatment give you the best chance for managing medical conditions that are more common for people who are older than age 70.  Falls are a major cause of broken bones and head injuries in people who are older than age 70. Take precautions to prevent a fall at home.  Work with your health care provider to learn what changes you can make to improve your health and wellness and to prevent falls. This information is not intended to replace advice given to you by your health care provider. Make sure you discuss any questions you have with your health care provider. Document Released: 11/22/2016 Document Revised: 05/02/2018 Document Reviewed: 11/22/2016 Elsevier Patient Education  2020 [employer]  

## 2018-08-08 NOTE — Progress Notes (Signed)
Subjective:    Patient ID: Laura James, female    DOB: 16-Sep-1950, 68 y.o.   MRN: [id number]  Chief Complaint  Patient presents with  . Medical Management of Chronic Issues    six month    Pt presents to the office today for chronic follow up.  Hypertension This is a chronic problem. The current episode started more than 1 year ago. The problem has been resolved since onset. The problem is controlled. Associated symptoms include shortness of breath (when exercising). Pertinent negatives include no headaches, malaise/fatigue or peripheral edema. Risk factors for coronary artery disease include dyslipidemia, obesity and sedentary lifestyle. The current treatment provides moderate improvement. There is no history of kidney disease, CAD/MI, CVA or heart failure.  Gastroesophageal Reflux She complains of belching and heartburn. She reports no tooth decay or no wheezing. This is a chronic problem. The current episode started more than 1 year ago. The problem occurs occasionally. The problem has been waxing and waning. She has tried a PPI for the symptoms. The treatment provided moderate relief.  Hyperlipidemia This is a chronic problem. The current episode started more than 1 year ago. The problem is uncontrolled. Recent lipid tests were reviewed and are high. Exacerbating diseases include obesity. Associated symptoms include shortness of breath (when exercising). Current antihyperlipidemic treatment includes statins. The current treatment provides moderate improvement of lipids. Risk factors for coronary artery disease include dyslipidemia, hypertension and post-menopausal.  OSA Uses CPAP most nights. Stable.   Review of Systems  Constitutional: Negative for malaise/fatigue.  Respiratory: Positive for shortness of breath (when exercising). Negative for wheezing.   Gastrointestinal: Positive for heartburn.  Neurological: Negative for headaches.  All other systems reviewed and are negative.       Objective:   Physical Exam Vitals signs reviewed.  Constitutional:      General: She is not in acute distress.    Appearance: She is well-developed.  HENT:     Head: Normocephalic and atraumatic.     Right Ear: Tympanic membrane normal.     Left Ear: Tympanic membrane normal.  Eyes:     Pupils: Pupils are equal, round, and reactive to light.  Neck:     Musculoskeletal: Normal range of motion and neck supple.     Thyroid: No thyromegaly.  Cardiovascular:     Rate and Rhythm: Normal rate and regular rhythm.     Heart sounds: Normal heart sounds. No murmur.  Pulmonary:     Effort: Pulmonary effort is normal. No respiratory distress.     Breath sounds: Normal breath sounds. No wheezing.  Abdominal:     General: Bowel sounds are normal. There is no distension.     Palpations: Abdomen is soft.     Tenderness: There is no abdominal tenderness.  Musculoskeletal: Normal range of motion.        General: No tenderness.  Skin:    General: Skin is warm and dry.  Neurological:     Mental Status: She is alert and oriented to person, place, and time.     Cranial Nerves: No cranial nerve deficit.     Deep Tendon Reflexes: Reflexes are normal and symmetric.  Psychiatric:        Behavior: Behavior normal.        Thought Content: Thought content normal.        Judgment: Judgment normal.     BP 106/66   Pulse 63   Temp 97.9 F (36.6 C) (Oral)   Ht 5'  4" (1.626 m)   Wt 183 lb (83 kg)   BMI 31.41 kg/m      Assessment & Plan:  Laura James comes in today with chief complaint of Medical Management of Chronic Issues (six month )   Diagnosis and orders addressed:  1. Essential hypertension, benign - CMP14+EGFR - CBC with Differential/Platelet  2. Gastroesophageal reflux disease without esophagitis - CMP14+EGFR - CBC with Differential/Platelet  3. Primary osteoarthritis involving multiple joints - CMP14+EGFR - CBC with Differential/Platelet  4. Obstructive sleep apnea -  CMP14+EGFR - CBC with Differential/Platelet  5. Hyperlipidemia, unspecified hyperlipidemia type - CMP14+EGFR - CBC with Differential/Platelet - Lipid panel   Labs pending Health Maintenance reviewed Diet and exercise encouraged  Follow up plan: 6 months   Evelina Dun, FNP

## 2018-08-09 ENCOUNTER — Other Ambulatory Visit: Payer: Self-pay | Admitting: *Deleted

## 2018-08-09 DIAGNOSIS — R5383 Other fatigue: Secondary | ICD-10-CM

## 2018-08-09 LAB — CBC WITH DIFFERENTIAL/PLATELET
Basophils Absolute: 0.1 10*3/uL (ref 0.0–0.2)
Basos: 1 %
EOS (ABSOLUTE): 0.5 10*3/uL — ABNORMAL HIGH (ref 0.0–0.4)
Eos: 6 %
Hematocrit: 37.8 % (ref 34.0–46.6)
Hemoglobin: 11.7 g/dL (ref 11.1–15.9)
Immature Grans (Abs): 0 10*3/uL (ref 0.0–0.1)
Immature Granulocytes: 0 %
Lymphocytes Absolute: 2.9 10*3/uL (ref 0.7–3.1)
Lymphs: 36 %
MCH: 23.8 pg — ABNORMAL LOW (ref 26.6–33.0)
MCHC: 31 g/dL — ABNORMAL LOW (ref 31.5–35.7)
MCV: 77 fL — ABNORMAL LOW (ref 79–97)
Monocytes Absolute: 0.5 10*3/uL (ref 0.1–0.9)
Monocytes: 7 %
Neutrophils Absolute: 4.1 10*3/uL (ref 1.4–7.0)
Neutrophils: 50 %
Platelets: 361 10*3/uL (ref 150–450)
RBC: 4.91 x10E6/uL (ref 3.77–5.28)
RDW: 14.5 % (ref 11.7–15.4)
WBC: 8.1 10*3/uL (ref 3.4–10.8)

## 2018-08-09 LAB — CMP14+EGFR
ALT: 16 IU/L (ref 0–32)
AST: 22 IU/L (ref 0–40)
Albumin/Globulin Ratio: 1.5 (ref 1.2–2.2)
Albumin: 4.1 g/dL (ref 3.8–4.8)
Alkaline Phosphatase: 84 IU/L (ref 39–117)
BUN/Creatinine Ratio: 14 (ref 12–28)
BUN: 16 mg/dL (ref 8–27)
Bilirubin Total: 0.3 mg/dL (ref 0.0–1.2)
CO2: 24 mmol/L (ref 20–29)
Calcium: 10.3 mg/dL (ref 8.7–10.3)
Chloride: 104 mmol/L (ref 96–106)
Creatinine, Ser: 1.16 mg/dL — ABNORMAL HIGH (ref 0.57–1.00)
GFR calc Af Amer: 56 mL/min/{1.73_m2} — ABNORMAL LOW (ref >59)
GFR calc non Af Amer: 48 mL/min/{1.73_m2} — ABNORMAL LOW (ref >59)
Globulin, Total: 2.8 g/dL (ref 1.5–4.5)
Glucose: 79 mg/dL (ref 65–99)
Potassium: 4.4 mmol/L (ref 3.5–5.2)
Sodium: 144 mmol/L (ref 134–144)
Total Protein: 6.9 g/dL (ref 6.0–8.5)

## 2018-08-09 LAB — LIPID PANEL
Chol/HDL Ratio: 3.2 ratio (ref 0.0–4.4)
Cholesterol, Total: 152 mg/dL (ref 100–199)
HDL: 47 mg/dL (ref >39)
LDL Calculated: 78 mg/dL (ref 0–99)
Triglycerides: 133 mg/dL (ref 0–149)
VLDL Cholesterol Cal: 27 mg/dL (ref 5–40)

## 2018-08-10 LAB — IRON: Iron: 57 ug/dL (ref 27–139)

## 2018-08-10 LAB — SPECIMEN STATUS REPORT

## 2018-08-12 DIAGNOSIS — H43813 Vitreous degeneration, bilateral: Secondary | ICD-10-CM | POA: Diagnosis not present

## 2018-08-12 DIAGNOSIS — Z961 Presence of intraocular lens: Secondary | ICD-10-CM | POA: Diagnosis not present

## 2018-08-12 DIAGNOSIS — H35371 Puckering of macula, right eye: Secondary | ICD-10-CM | POA: Diagnosis not present

## 2018-08-12 DIAGNOSIS — H25812 Combined forms of age-related cataract, left eye: Secondary | ICD-10-CM | POA: Diagnosis not present

## 2018-08-12 DIAGNOSIS — H04123 Dry eye syndrome of bilateral lacrimal glands: Secondary | ICD-10-CM | POA: Diagnosis not present

## 2018-10-11 ENCOUNTER — Other Ambulatory Visit: Payer: Self-pay | Admitting: Family

## 2018-11-03 ENCOUNTER — Other Ambulatory Visit: Payer: Self-pay | Admitting: Family

## 2018-11-04 ENCOUNTER — Other Ambulatory Visit: Payer: Self-pay

## 2018-11-05 ENCOUNTER — Encounter: Payer: Self-pay | Admitting: Family

## 2018-11-05 ENCOUNTER — Ambulatory Visit (INDEPENDENT_AMBULATORY_CARE_PROVIDER_SITE_OTHER): Payer: Medicare Other | Admitting: Family

## 2018-11-05 VITALS — BP 118/72 | HR 58 | Temp 98.0°F | Ht 64.0 in | Wt 187.0 lb

## 2018-11-05 DIAGNOSIS — Z23 Encounter for immunization: Secondary | ICD-10-CM

## 2018-11-05 DIAGNOSIS — M5441 Lumbago with sciatica, right side: Secondary | ICD-10-CM | POA: Diagnosis not present

## 2018-11-05 DIAGNOSIS — M7071 Other bursitis of hip, right hip: Secondary | ICD-10-CM

## 2018-11-05 DIAGNOSIS — G8929 Other chronic pain: Secondary | ICD-10-CM

## 2018-11-05 MED ORDER — DICLOFENAC SODIUM 75 MG PO TBEC: 75.0000 mg | DELAYED_RELEASE_TABLET | Freq: Two times a day (BID) | ORAL | 3 refills | Status: DC

## 2018-11-05 MED ORDER — PREDNISONE 10 MG (21) PO TBPK: ORAL_TABLET | ORAL | 0 refills | Status: DC

## 2018-11-05 NOTE — Progress Notes (Signed)
Subjective:    Patient ID: Laura James, female    DOB: 04-19-1950, 68 y.o.   MRN: [id number]  Chief Complaint  Patient presents with  . right hip and knee pain   PT presents to the office today with right lower back pain, right hip pain that started over a year ago. However, it has gradually worsen where she can not lay flat or stand in place too long. She states she had back pain about a year ago and saw Ortho who told her they thought it was her sciatic. She states she never had a MRI, but the pain has gradually worsen. She has taken motrin with mild relief.   She completed physical therapy with no relief.  Arthritis Presents for follow-up visit. She complains of pain and stiffness. The symptoms have been worsening. Affected locations include the right knee, left hip and right hip (back). Her pain is at a severity of 8/10 (when laying down).      Review of Systems  Musculoskeletal: Positive for arthritis and stiffness.  All other systems reviewed and are negative.      Objective:   Physical Exam Vitals signs reviewed.  Constitutional:      General: She is not in acute distress.    Appearance: She is well-developed.  HENT:     Head: Normocephalic and atraumatic.  Eyes:     Pupils: Pupils are equal, round, and reactive to light.  Neck:     Musculoskeletal: Normal range of motion and neck supple.     Thyroid: No thyromegaly.  Cardiovascular:     Rate and Rhythm: Normal rate and regular rhythm.     Heart sounds: Normal heart sounds. No murmur.  Pulmonary:     Effort: Pulmonary effort is normal. No respiratory distress.     Breath sounds: Normal breath sounds. No wheezing.  Abdominal:     General: Bowel sounds are normal. There is no distension.     Palpations: Abdomen is soft.     Tenderness: There is no abdominal tenderness.  Musculoskeletal:        General: Tenderness present.     Comments: Negative SLR, tenderness on lateral right hip with palpation, pain in right  hip with internal rotation  Skin:    General: Skin is warm and dry.  Neurological:     Mental Status: She is alert and oriented to person, place, and time.     Cranial Nerves: No cranial nerve deficit.     Deep Tendon Reflexes: Reflexes are normal and symmetric.  Psychiatric:        Behavior: Behavior normal.        Thought Content: Thought content normal.        Judgment: Judgment normal.      BP 118/72   Pulse (!) 58   Temp 98 F (36.7 C) (Temporal)   Ht 5\' 4"  (1.626 m)   Wt 187 lb (84.8 kg)   SpO2 98%   BMI 32.10 kg/m       Assessment & Plan:  Laura James comes in today with chief complaint of right hip and knee pain   Diagnosis and orders addressed:  1. Chronic right-sided low back pain with right-sided sciatica ROM exercises encouraged Rest Ice MRI pending - MR Lumbar Spine Wo Contrast; Future - predniSONE (STERAPRED UNI-PAK 21 TAB) 10 MG (21) TBPK tablet; Use as directed  Dispense: 21 tablet; Refill: 0 - diclofenac (VOLTAREN) 75 MG EC tablet; Take 1 tablet (75  mg total) by mouth 2 (two) times daily.  Dispense: 60 tablet; Refill: 3  2. Bursitis of right hip, unspecified bursa Rest Ice ROM exercises encouraged- Handout given No other NSAID's while taking diclofenac  - predniSONE (STERAPRED UNI-PAK 21 TAB) 10 MG (21) TBPK tablet; Use as directed  Dispense: 21 tablet; Refill: 0 - diclofenac (VOLTAREN) 75 MG EC tablet; Take 1 tablet (75 mg total) by mouth 2 (two) times daily.  Dispense: 60 tablet; Refill: [place], FNP

## 2018-11-05 NOTE — Patient Instructions (Signed)
Hip Bursitis Rehab Ask your health care provider which exercises are safe for you. Do exercises exactly as told by your health care provider and adjust them as directed. It is normal to feel mild stretching, pulling, tightness, or discomfort as you do these exercises. Stop right away if you feel sudden pain or your pain gets worse. Do not begin these exercises until told by your health care provider. Stretching exercise This exercise warms up your muscles and joints and improves the movement and flexibility of your hip. This exercise also helps to relieve pain and stiffness. Iliotibial band stretch An iliotibial band is a strong band of muscle tissue that runs from the outer side of your hip to the outer side of your thigh and knee. 1. Lie on your side with your left / right leg in the top position. 2. Bend your left / right knee and grab your ankle. Stretch out your bottom arm to help you balance. 3. Slowly bring your knee back so your thigh is behind your body. 4. Slowly lower your knee toward the floor until you feel a gentle stretch on the outside of your left / right thigh. If you do not feel a stretch and your knee will not fall farther, place the heel of your other foot on top of your knee and pull your knee down toward the floor with your foot. 5. Hold this position for __________ seconds. 6. Slowly return to the starting position. Repeat __________ times. Complete this exercise __________ times a day. Strengthening exercises These exercises build strength and endurance in your hip and pelvis. Endurance is the ability to use your muscles for a long time, even after they get tired. Bridge This exercise strengthens the muscles that move your thigh backward (hip extensors). 1. Lie on your back on a firm surface with your knees bent and your feet flat on the floor. 2. Tighten your buttocks muscles and lift your buttocks off the floor until your trunk is level with your thighs. ? Do not arch  your back. ? You should feel the muscles working in your buttocks and the back of your thighs. If you do not feel these muscles, slide your feet 1-2 inches (2.5-5 cm) farther away from your buttocks. ? If this exercise is too easy, try doing it with your arms crossed over your chest. 3. Hold this position for __________ seconds. 4. Slowly lower your hips to the starting position. 5. Let your muscles relax completely after each repetition. Repeat __________ times. Complete this exercise __________ times a day. Squats This exercise strengthens the muscles in front of your thigh and knee (quadriceps). 1. Stand in front of a table, with your feet and knees pointing straight ahead. You may rest your hands on the table for balance but not for support. 2. Slowly bend your knees and lower your hips like you are going to sit in a chair. ? Keep your weight over your heels, not over your toes. ? Keep your lower legs upright so they are parallel with the table legs. ? Do not let your hips go lower than your knees. ? Do not bend lower than told by your health care provider. ? If your hip pain increases, do not bend as low. 3. Hold the squat position for __________ seconds. 4. Slowly push with your legs to return to standing. Do not use your hands to pull yourself to standing. Repeat __________ times. Complete this exercise __________ times a day. Hip hike 1. Stand   sideways on a bottom step. Stand on your left / right leg with your other foot unsupported next to the step. You can hold on to the railing or wall for balance if needed. 2. Keep your knees straight and your torso square. Then lift your left / right hip up toward the ceiling. 3. Hold this position for __________ seconds. 4. Slowly let your left / right hip lower toward the floor, past the starting position. Your foot should get closer to the floor. Do not lean or bend your knees. Repeat __________ times. Complete this exercise __________ times a  day. Single leg stand 1. Without shoes, stand near a railing or in a doorway. You may hold on to the railing or door frame as needed for balance. 2. Squeeze your left / right buttock muscles, then lift up your other foot. ? Do not let your left / right hip push out to the side. ? It is helpful to stand in front of a mirror for this exercise so you can watch your hip. 3. Hold this position for __________ seconds. Repeat __________ times. Complete this exercise __________ times a day. This information is not intended to replace advice given to you by your health care provider. Make sure you discuss any questions you have with your health care provider. Document Released: 02/17/2004 Document Revised: 05/06/2018 Document Reviewed: 05/06/2018 Elsevier Patient Education  [employer]. Hip Bursitis  Hip bursitis is inflammation of a fluid-filled sac (bursa) in the hip joint. The bursa prevents the bones in the hip joint from rubbing against each other. Hip bursitis can cause mild to moderate pain, and symptoms often come and go over time. What are the causes? This condition may be caused by:  Injury to the hip.  Overuse of the muscles that surround the hip joint.  Previous injury or surgery of the hip.  Arthritis or gout.  Diabetes.  Thyroid disease.  Infection. In some cases, the cause may not be known. What are the signs or symptoms? Symptoms of this condition include:  Mild or moderate pain in the hip area. Pain may get worse with movement.  Tenderness and swelling of the hip, especially on the outer side of the hip.  In rare cases, the bursa may become infected. This may cause a fever, as well as warmth and redness in the area. Symptoms may come and go. How is this diagnosed? This condition may be diagnosed based on:  A physical exam.  Your medical history.  X-rays.  Removal of fluid from your inflamed bursa for testing (biopsy). You may be sent to a health care  provider who specializes in bone diseases (orthopedist) or a provider who specializes in joint inflammation (rheumatologist). How is this treated? This condition is treated by resting, icing, applying pressure (compression), and raising (elevating) the injured area. This is called RICE treatment. In some cases, this may be enough to make your symptoms go away. Treatment may also include:  Using crutches.  Draining fluid out of the bursa to help relieve swelling.  Injecting medicine that helps to reduce inflammation (cortisone).  Additional medicines if the bursa is infected. Follow these instructions at home: Managing pain, stiffness, and swelling   If directed, put ice on the painful area. ? Put ice in a plastic bag. ? Place a towel between your skin and the bag. ? Leave the ice on for 20 minutes, 2-3 times a day. ? Raise (elevate) your hip above the level of your heart as  much as you can without pain. To do this, try putting a pillow under your hips while you lie down. Activity  Return to your normal activities as told by your health care provider. Ask your health care provider what activities are safe for you.  Rest and protect your hip as much as possible until your pain and swelling get better. General instructions  Take over-the-counter and prescription medicines only as told by your health care provider.  Wear compression wraps only as told by your health care provider.  Do not use your hip to support your body weight until your health care provider says that you can. Use crutches as told by your health care provider.  Gently massage and stretch your injured area as often as is comfortable.  Keep all follow-up visits as told by your health care provider. This is important. How is this prevented?  Exercise regularly, as told by your health care provider.  Warm up and stretch before being active.  Cool down and stretch after being active.  If an activity irritates  your hip or causes pain, avoid the activity as much as possible.  Avoid sitting down for long periods at a time. Contact a health care provider if you:  Have a fever.  Develop new symptoms.  Have difficulty walking or doing everyday activities.  Have pain that gets worse or does not get better with medicine.  Develop red skin or a feeling of warmth in your hip area. Get help right away if you:  Cannot move your hip.  Have severe pain. Summary  Hip bursitis is inflammation of a fluid-filled sac (bursa) in the hip joint.  Hip bursitis can cause mild to moderate pain, and symptoms often come and go over time.  This condition is treated with rest, ice, compression, elevation, and medicines. This information is not intended to replace advice given to you by your health care provider. Make sure you discuss any questions you have with your health care provider. Document Released: 07/01/2001 Document Revised: 09/17/2017 Document Reviewed: 09/17/2017 Elsevier Patient Education  2020 [employer].

## 2018-11-11 ENCOUNTER — Ambulatory Visit (HOSPITAL_COMMUNITY)
Admission: RE | Admit: 2018-11-11 | Discharge: 2018-11-11 | Disposition: A | Discharge status: Home / Self Care | Payer: Medicare Other | Source: Ambulatory Visit | Attending: Family | Admitting: Family

## 2018-11-11 ENCOUNTER — Other Ambulatory Visit: Payer: Self-pay

## 2018-11-11 DIAGNOSIS — M5441 Lumbago with sciatica, right side: Secondary | ICD-10-CM | POA: Diagnosis not present

## 2018-11-11 DIAGNOSIS — G8929 Other chronic pain: Secondary | ICD-10-CM | POA: Insufficient documentation

## 2018-11-24 ENCOUNTER — Other Ambulatory Visit: Payer: Self-pay | Admitting: Family

## 2018-11-24 DIAGNOSIS — I10 Essential (primary) hypertension: Secondary | ICD-10-CM

## 2018-12-03 ENCOUNTER — Other Ambulatory Visit: Payer: Self-pay

## 2018-12-03 DIAGNOSIS — Z20822 Contact with and (suspected) exposure to covid-19: Secondary | ICD-10-CM

## 2018-12-05 LAB — NOVEL CORONAVIRUS, NAA: SARS-CoV-2, NAA: NOT DETECTED

## 2018-12-11 ENCOUNTER — Ambulatory Visit (INDEPENDENT_AMBULATORY_CARE_PROVIDER_SITE_OTHER): Payer: Medicare Other | Admitting: *Deleted

## 2018-12-11 ENCOUNTER — Other Ambulatory Visit: Payer: Self-pay

## 2018-12-11 DIAGNOSIS — Z0000 Encounter for general adult medical examination without abnormal findings: Secondary | ICD-10-CM | POA: Diagnosis not present

## 2018-12-11 NOTE — Progress Notes (Addendum)
...     [place] VISIT  12/11/2018  Telephone Visit Disclaimer This Medicare AWV was conducted by telephone due to national recommendations for restrictions regarding the COVID-19 Pandemic (e.g. social distancing).  I verified, using two identifiers, that I am speaking with Laura James Jungwirth or their authorized healthcare agent. I discussed the limitations, risks, security, and privacy concerns of performing an evaluation and management service by telephone and the potential availability of an in-person appointment in the future. The patient expressed understanding and agreed to proceed.   Subjective:  Laura James is a 68 y.o. female patient of Hawks, Laura Hawthorne, FNP who had a Medicare Annual Wellness Visit today via telephone. Laura James is Retired and lives with their spouse. she has 3 children. she reports that she is socially active and does interact with friends/family regularly. she is not physically active and enjoys reading,studying the bible.  Patient Care Team: Sharion Balloon, FNP as PCP - General (Family Medicine)  Advanced Directives 12/11/2018 11/01/2017 08/02/2016 12/27/2015 04/02/2014  Does Patient Have a Medical Advance Directive? No No No No No  Would patient like information on creating a medical advance directive? Yes (MAU/Ambulatory/Procedural Areas - Information given);Yes (ED - Information included in AVS) No - Patient declined Yes (ED - Information included in AVS) - [facility] Utilization Over the Past 12 Months: # of hospitalizations or ER visits: 0 # of surgeries: 0  Review of Systems    Patient reports that her overall health is unchanged compared to last year.  History obtained from chart review and the patient  Patient Reported Readings (BP, Pulse, CBG, Weight, etc) none  Pain Assessment Pain : No/denies pain     Current Medications & Allergies (verified) Allergies as of 12/11/2018      Reactions   Adhesive [tape] Itching, Rash   Latex  Rash      Medication List       Accurate as of December 11, 2018  9:04 AM. If you have any questions, ask your nurse or doctor.        STOP taking these medications   ibuprofen 100 MG/5ML suspension Commonly known as: ADVIL   omeprazole 20 MG capsule Commonly known as: PriLOSEC   predniSONE 10 MG (21) Tbpk tablet Commonly known as: STERAPRED UNI-PAK 21 TAB     TAKE these medications   albuterol 108 (90 Base) MCG/ACT inhaler Commonly known as: VENTOLIN HFA TAKE 2 PUFFS BY MOUTH EVERY 6 HOURS AS NEEDED FOR WHEEZE OR SHORTNESS OF BREATH   aspirin 81 MG tablet Take 81 mg by mouth daily.   atenolol 25 MG tablet Commonly known as: TENORMIN TAKE 1 TABLET BY MOUTH EVERY DAY   atorvastatin 10 MG tablet Commonly known as: LIPITOR TAKE 1 TABLET BY MOUTH EVERY DAY   B-12 3000 MCG Caps Take 1 capsule by mouth daily.   cetirizine 10 MG tablet Commonly known as: ZYRTEC Take 10 mg by mouth daily as needed.   cholecalciferol 1000 units tablet Commonly known as: VITAMIN D Take 1 tablet (1,000 Units total) by mouth daily.   diclofenac 75 MG EC tablet Commonly known as: VOLTAREN Take 1 tablet (75 mg total) by mouth 2 (two) times daily.   fluticasone 50 MCG/ACT nasal spray Commonly known as: FLONASE Place 2 sprays into both nostrils daily. What changed:   when to take this  reasons to take this   hydrochlorothiazide 25 MG tablet Commonly known as: HYDRODIURIL TAKE 1 TABLET BY MOUTH  EVERY DAY   magnesium oxide 400 MG tablet Commonly known as: MAG-OX Take 400 mg by mouth daily.       History (reviewed): Past Medical History:  Diagnosis Date  . Barrett esophagus   . 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 Depression   . Diverticulosis of colon (without mention of hemorrhage)   . Esophageal reflux   . Essential hypertension, benign   . Gastritis   . Grave's disease   . Hiatal hernia   . Hypothyroidism   . IBS  (irritable bowel syndrome)   . Obesity   . Osteopenia   . Sleep apnea   . Symptomatic menopausal or female climacteric states    Past Surgical History:  Procedure Laterality Date  . ABDOMINAL HYSTERECTOMY N/A   . CHOLECYSTECTOMY    . INCONTINENCE SURGERY    . TONSILLECTOMY     Family History  Problem Relation Age of Onset  . Lung cancer Father   . Hypertension Mother   . Arthritis Mother   . Osteoporosis Mother   . Kidney disease Mother        related to APAP use  . Diabetes Other   . Clotting disorder Other   . Breast cancer Other        aunt  . Hypertension Brother   . Hypertension Daughter   . Diabetes Son   . Diabetes Daughter   . Colon cancer Neg Hx    Social History   Socioeconomic History  . Marital status: Married    Spouse name: Not on file  . Number of children: 3  . Years of education: 47  . Highest education level: Master's degree (e.g., MA, MS, MEng, MEd, MSW, MBA)  Occupational History  . Occupation: Hospice- Chaplain    Comment: [employer]  . Occupation: retired  Scientific laboratory technician  . Financial resource strain: Not hard at all  . Food insecurity    Worry: Not on file    Inability: Not on file  . Transportation needs    Medical: No    Non-medical: No  Tobacco Use  . Smoking status: Former Smoker    Packs/day: 0.50    Years: 5.00    Pack years: 2.50    Types: Cigarettes    Quit date: 01/24/1968    Years since quitting: 50.9  . Smokeless tobacco: Never Used  Substance and Sexual Activity  . Alcohol use: No    Alcohol/week: 0.0 standard drinks  . Drug use: No  . Sexual activity: Yes  Lifestyle  . Physical activity    Days per week: 0 days    Minutes per session: 0 min  . Stress: Not at all  Relationships  . Social connections    Talks on phone: More than three times a week    Gets together: More than three times a week    Attends religious service: More than 4 times per year    Active member of club or organization: Yes    Attends  meetings of clubs or organizations: More than 4 times per year    Relationship status: Married  Other Topics Concern  . Not on file  Social History Narrative   Lives at home with husband     Activities of Daily Living In your present state of health, do you have any difficulty performing the following activities: 12/11/2018  Hearing? N  Vision? N  Difficulty concentrating or making decisions? N  Walking or  climbing stairs? N  Dressing or bathing? N  Doing errands, shopping? N  Preparing Food and eating ? N  Using the Toilet? N  In the past six months, have you accidently leaked urine? N  Do you have problems with loss of bowel control? N  Managing your Medications? N  Managing your Finances? N  Housekeeping or managing your Housekeeping? N  Some recent data might be hidden    Patient Education/ Literacy How often do you need to have someone help you when you read instructions, pamphlets, or other written materials from your doctor or pharmacy?: 1 - Never What is the last grade level you completed in school?: Master's  Exercise Current Exercise Habits: Home exercise routine, Type of exercise: treadmill;walking, Time (Minutes): 30, Frequency (Times/Week): 4, Weekly Exercise (Minutes/Week): 120  Diet Patient reports consuming 2 meals a day and 2 snack(s) a day Patient reports that her primary diet is: Regular Patient reports that she does have regular access to food.   Depression Screen PHQ 2/9 Scores 12/11/2018 11/05/2018 08/08/2018 02/07/2018 11/01/2017 10/25/2017 06/12/2017  PHQ - 2 Score 0 0 0 0 0 0 0     Fall Risk Fall Risk  12/11/2018 11/05/2018 08/08/2018 02/07/2018 11/01/2017  Falls in the past year? 0 0 0 0 No     Objective:  Laura James Laura James seemed alert and oriented and she participated appropriately during our telephone visit.  Blood Pressure Weight BMI  BP Readings from Last 3 Encounters:  11/05/18 118/72  08/08/18 106/66  04/04/18 122/80   Wt Readings from  Last 3 Encounters:  11/05/18 187 lb (84.8 kg)  08/08/18 183 lb (83 kg)  04/04/18 179 lb 6.4 oz (81.4 kg)   BMI Readings from Last 1 Encounters:  11/05/18 32.10 kg/m    *Unable to obtain current vital signs, weight, and BMI due to telephone visit type  Hearing/Vision  . Shalia did not seem to have difficulty with hearing/understanding during the telephone conversation . Reports that she has had a formal eye exam by an eye care professional within the past year . Reports that she has not had a formal hearing evaluation within the past year *Unable to fully assess hearing and vision during telephone visit type  Cognitive Function: 6CIT Screen 12/11/2018  What Year? 0 points  What month? 0 points  What time? 0 points  Count back from 20 0 points  Months in reverse 0 points  Repeat phrase 0 points  Total Score 0   (Normal:0-7, Significant for Dysfunction: >8)  Normal Cognitive Function Screening: Yes   Immunization & Health Maintenance Record Immunization History  Administered Date(s) Administered  . Fluad Quad(high Dose 65+) 11/05/2018  . Influenza, High Dose Seasonal PF 01/19/2016, 11/24/2016  . Influenza-Unspecified 11/23/2013  . Pneumococcal Conjugate-13 01/19/2016  . Pneumococcal Polysaccharide-23 11/01/2017  . Tdap 12/23/2013  . Zoster 06/28/2015    Health Maintenance  Topic Date Due  . DEXA SCAN  08/03/2018  . MAMMOGRAM  11/19/2018  . COLONOSCOPY  11/29/2020  . TETANUS/TDAP  12/24/2023  . INFLUENZA VACCINE  Completed  . Hepatitis C Screening  Completed  . PNA vac Low Risk Adult  Completed       Assessment  This is a routine wellness examination for [employer].  Health Maintenance: Due or Overdue Health Maintenance Due  Topic Date Due  . DEXA SCAN  08/03/2018  . MAMMOGRAM  11/19/2018    Laura James does not need a referral for Community Assistance: Care Management:  no Social Work:    no Prescription Assistance:  no Nutrition/Diabetes  Education:  no   Plan:  Personalized Goals Goals Addressed            This Visit's Progress   . Exercise 3x per week (30 min per time)   Not on track    Increase walking to 3 times per week for 30 minutes each session    . Increase physical activity       Pt has not maintained last year' s goal. She would like to do the same goal again and will start right away.      Personalized Health Maintenance & Screening Recommendations  Screening mammography Bone densitometry screening  Lung Cancer Screening Recommended: no (Low Dose CT Chest recommended if Age 82-80 years, 30 pack-year currently smoking OR have quit w/in past 15 years) Hepatitis C Screening recommended: no HIV Screening recommended: no  Advanced Directives: Written information was prepared per patient's request.  Referrals & Orders No orders of the defined types were placed in this encounter.   Follow-up Plan . Follow-up with Sharion Balloon, FNP as planned . Scheduled  Mammogram for 12/17/18. . All immunizations are up to date, except Shingrix. She will discuss vaccine and cost at next visit. [facility] is great per patient, just arthritis is flaring up more often. . Advanced directives placed up front for pick up per patients request.    I have personally reviewed and noted the following in the patient's chart:   . Medical and social history . Use of alcohol, tobacco or illicit drugs  . Current medications and supplements . Functional ability and status . Nutritional status . Physical activity . Advanced directives . List of other physicians . Hospitalizations, surgeries, and ER visits in previous 12 months . Vitals . Screenings to include cognitive, depression, and falls . Referrals and appointments  In addition, I have reviewed and discussed with Laura James Castell certain preventive protocols, quality metrics, and best practice recommendations. A written personalized care plan for preventive services as  well as general preventive health recommendations is available and can be mailed to the patient at her request.      Faylene Million Charmaine,LPN  [phone]   I have reviewed and agree with the above AWV documentation.   Evelina Dun, FNP

## 2018-12-25 ENCOUNTER — Ambulatory Visit (INDEPENDENT_AMBULATORY_CARE_PROVIDER_SITE_OTHER): Payer: Medicare Other | Admitting: Physician Assistant

## 2018-12-25 ENCOUNTER — Other Ambulatory Visit: Payer: Self-pay

## 2018-12-25 ENCOUNTER — Encounter: Payer: Self-pay | Admitting: Physician Assistant

## 2018-12-25 DIAGNOSIS — R110 Nausea: Secondary | ICD-10-CM | POA: Diagnosis not present

## 2018-12-25 DIAGNOSIS — R42 Dizziness and giddiness: Secondary | ICD-10-CM

## 2018-12-25 MED ORDER — HYDROXYZINE HCL 25 MG PO TABS: 25.0000 mg | ORAL_TABLET | Freq: Three times a day (TID) | ORAL | 2 refills | Status: DC | PRN

## 2018-12-25 MED ORDER — MECLIZINE HCL 25 MG PO TABS: 25.0000 mg | ORAL_TABLET | Freq: Three times a day (TID) | ORAL | 0 refills | Status: DC | PRN

## 2018-12-25 NOTE — Progress Notes (Signed)
Telephone visit  Subjective: [id number] PCP: Sharion Balloon, FNP [id number] Laura James is a 68 y.o. female calls for telephone consult today. Patient provides verbal consent for consult held via phone.  Patient is identified with 2 separate identifiers.  At this time the entire area is on COVID-19 social distancing and stay home orders are in place.  Patient is of higher risk and therefore we are performing this by a virtual method.  Location of patient: home Location of provider: HOME Others present for call: no  Patient reports that she has had 1 day of vertigo type symptoms.  She reports that she did have this problem several years ago.  Yesterday when she got up she was feeling a little bad.  As the day went on she went to her daughter'Laura house and was bending over helping her pain and while she was in that position she experienced the first symptoms of the vertigo.  As time went on it never got better and she experience a lot of nausea.  She denies any vomiting.  Her husband did drive her home.  Even when she was driving it felt very dizzy.  She has tried to rest.  When she is still it does not bother her too much but it is still quite present today.   ROS: Per HPI  Allergies  Allergen Reactions  . Adhesive [Tape] Itching and Rash  . Latex Rash   Past Medical History:  Diagnosis Date  . Barrett esophagus   . 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 Depression   . Diverticulosis of colon (without mention of hemorrhage)   . Esophageal reflux   . Essential hypertension, benign   . Gastritis   . Grave'Laura disease   . Hiatal hernia   . Hypothyroidism   . IBS (irritable bowel syndrome)   . Obesity   . Osteopenia   . Sleep apnea   . Symptomatic menopausal or female climacteric states     Current Outpatient Medications:  .  albuterol (VENTOLIN HFA) 108 (90 Base) MCG/ACT inhaler, TAKE 2 PUFFS BY MOUTH EVERY 6 HOURS AS NEEDED FOR WHEEZE  OR SHORTNESS OF BREATH, Disp: 6.7 Inhaler, Rfl: 0 .  aspirin 81 MG tablet, Take 81 mg by mouth daily.  , Disp: , Rfl:  .  atenolol (TENORMIN) 25 MG tablet, TAKE 1 TABLET BY MOUTH EVERY DAY, Disp: 90 tablet, Rfl: 0 .  atorvastatin (LIPITOR) 10 MG tablet, TAKE 1 TABLET BY MOUTH EVERY DAY, Disp: 90 tablet, Rfl: 0 .  cetirizine (ZYRTEC) 10 MG tablet, Take 10 mg by mouth daily as needed. , Disp: , Rfl:  .  cholecalciferol (VITAMIN D) 1000 units tablet, Take 1 tablet (1,000 Units total) by mouth daily., Disp: , Rfl:  .  Cyanocobalamin (B-12) 3000 MCG CAPS, Take 1 capsule by mouth daily., Disp: , Rfl:  .  diclofenac (VOLTAREN) 75 MG EC tablet, Take 1 tablet (75 mg total) by mouth 2 (two) times daily. (Patient not taking: Reported on 12/11/2018), Disp: 60 tablet, Rfl: 3 .  fluticasone (FLONASE) 50 MCG/ACT nasal spray, Place 2 sprays into both nostrils daily. (Patient taking differently: Place 2 sprays into both nostrils daily as needed. ), Disp: 16 g, Rfl: 6 .  hydrochlorothiazide (HYDRODIURIL) 25 MG tablet, TAKE 1 TABLET BY MOUTH EVERY DAY, Disp: 90 tablet, Rfl: 1 .  magnesium oxide (MAG-OX) 400 MG tablet, Take 400 mg by mouth daily., Disp: , Rfl:  .  meclizine (ANTIVERT) 25 MG tablet, Take 1 tablet (25 mg total) by mouth 3 (three) times daily as needed for dizziness., Disp: 30 tablet, Rfl: 0  Assessment/ Plan: 68 y.o. female   1. Vertigo - meclizine (ANTIVERT) 25 MG tablet; Take 1 tablet (25 mg total) by mouth 3 (three) times daily as needed for dizziness.  Dispense: 30 tablet; Refill: 0  2. Nausea - meclizine (ANTIVERT) 25 MG tablet; Take 1 tablet (25 mg total) by mouth 3 (three) times daily as needed for dizziness.  Dispense: 30 tablet; Refill: 0   Return if symptoms worsen or fail to improve.  Continue all other maintenance medications as listed above.  Start time: 2:58 PM End time: 3:09 PM  Meds ordered this encounter  Medications  . meclizine (ANTIVERT) 25 MG tablet    Sig: Take 1  tablet (25 mg total) by mouth 3 (three) times daily as needed for dizziness.    Dispense:  30 tablet    Refill:  0    Order Specific Question:   Supervising Provider    Answer:   Janora Norlander [id number]    Particia Nearing PA-C [place] [phone]

## 2018-12-27 ENCOUNTER — Telehealth: Payer: Self-pay | Admitting: Family

## 2018-12-27 NOTE — Telephone Encounter (Signed)
Patient wants nurse to call with Mammogram results.

## 2018-12-27 NOTE — Telephone Encounter (Signed)
Patient notified of results.

## 2019-01-28 ENCOUNTER — Other Ambulatory Visit: Payer: Self-pay | Admitting: Family

## 2019-02-22 ENCOUNTER — Other Ambulatory Visit: Payer: Self-pay | Admitting: Family

## 2019-02-22 DIAGNOSIS — I10 Essential (primary) hypertension: Secondary | ICD-10-CM

## 2019-02-27 ENCOUNTER — Other Ambulatory Visit: Payer: Self-pay | Admitting: Family

## 2019-02-27 NOTE — Telephone Encounter (Signed)
[facility]. NTBS 30 days given 01/28/19

## 2019-02-27 NOTE — Telephone Encounter (Signed)
Lmtcb.

## 2019-03-06 ENCOUNTER — Other Ambulatory Visit: Payer: Self-pay

## 2019-03-06 NOTE — Patient Outreach (Signed)
[facility]) Care Management  03/06/2019  Laura James 1950-12-15 [id number]   Medication Adherence call to Mrs. Laura James [employer] Verify spoke with patient she is past due on Atorvastatin 10 mg,patient explain she takes 1 tablet daily and has medication at this time,patient explain she will order when due. Laura James is showing past due under [place].   [place] Management Direct Dial [phone]  Fax [phone] [email or web address]

## 2019-03-20 ENCOUNTER — Other Ambulatory Visit: Payer: Self-pay | Admitting: Family

## 2019-03-20 DIAGNOSIS — I10 Essential (primary) hypertension: Secondary | ICD-10-CM

## 2019-03-21 ENCOUNTER — Other Ambulatory Visit: Payer: Self-pay

## 2019-03-24 ENCOUNTER — Ambulatory Visit (INDEPENDENT_AMBULATORY_CARE_PROVIDER_SITE_OTHER): Payer: Medicare Other | Admitting: Family Medicine

## 2019-03-24 ENCOUNTER — Encounter: Payer: Self-pay | Admitting: Family Medicine

## 2019-03-24 ENCOUNTER — Other Ambulatory Visit: Payer: Self-pay

## 2019-03-24 VITALS — BP 98/64 | HR 67 | Temp 98.2°F | Resp 20 | Ht 64.0 in | Wt 186.0 lb

## 2019-03-24 DIAGNOSIS — G2581 Restless legs syndrome: Secondary | ICD-10-CM

## 2019-03-24 DIAGNOSIS — L299 Pruritus, unspecified: Secondary | ICD-10-CM | POA: Diagnosis not present

## 2019-03-24 DIAGNOSIS — I10 Essential (primary) hypertension: Secondary | ICD-10-CM

## 2019-03-24 DIAGNOSIS — R202 Paresthesia of skin: Secondary | ICD-10-CM

## 2019-03-24 DIAGNOSIS — M8589 Other specified disorders of bone density and structure, multiple sites: Secondary | ICD-10-CM | POA: Diagnosis not present

## 2019-03-24 MED ORDER — ROPINIROLE HCL 0.25 MG PO TABS: 0.2500 mg | ORAL_TABLET | Freq: Every day | ORAL | 3 refills | Status: DC

## 2019-03-24 MED ORDER — ATENOLOL 25 MG PO TABS: 12.5000 mg | ORAL_TABLET | Freq: Every day | ORAL | 5 refills | Status: DC

## 2019-03-24 NOTE — Patient Instructions (Signed)

## 2019-03-24 NOTE — Progress Notes (Signed)
Subjective:  Patient ID: Laura James, female    DOB: 30-Dec-1950, 69 y.o.   MRN: [id number]  Patient Care Team: Sharion Balloon, FNP as PCP - General (Family Medicine)   Chief Complaint:  rls and anxiety / itching   HPI: Laura James is a 69 y.o. female presenting on 03/24/2019 for rls and anxiety / itching  Pt presents today with ongoing and worsening restless leg syndrome, pruritis without rash, and intermittent positional dizziness. Pt states this started about 6-8 months ago and does not seem to be improving. She has not been taking the Vistaril as needed for pruritis. States her legs are constantly needing to move when in bed at night. States she feels like she has to move them. She states the pruritis is worse at night also. States she will take a shower and this will relieve the pruritis but not the restless legs. States she has had intermittent positional vertigo. States when she stands suddenly she has dizziness. States this subsides after a few seconds. She has noticed her BP being in the 90/60 range at home. No chest pain, weakness, or confusion with the dizziness.  No other associated symptoms. States she seems to get anxious when the symptoms start and this makes it worse.    Relevant past medical, surgical, family, and social history reviewed and updated as indicated.  Allergies and medications reviewed and updated. Date reviewed: Chart in Epic.   Past Medical History:  Diagnosis Date  . Barrett esophagus   . 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 Depression   . Diverticulosis of colon (without mention of hemorrhage)   . Esophageal reflux   . Essential hypertension, benign   . Gastritis   . Grave's disease   . Hiatal hernia   . Hypothyroidism   . IBS (irritable bowel syndrome)   . Obesity   . Osteopenia   . Sleep apnea   . Symptomatic menopausal or female climacteric states     Past Surgical History:  Procedure  Laterality Date  . ABDOMINAL HYSTERECTOMY N/A   . CHOLECYSTECTOMY    . INCONTINENCE SURGERY    . TONSILLECTOMY      Social History   Socioeconomic History  . Marital status: Married    Spouse name: Not on file  . Number of children: 3  . Years of education: 25  . Highest education level: Master's degree (e.g., MA, MS, MEng, MEd, MSW, MBA)  Occupational History  . Occupation: Hospice- Chaplain    Comment: [employer]  . Occupation: retired  Tobacco Use  . Smoking status: Former Smoker    Packs/day: 0.50    Years: 5.00    Pack years: 2.50    Types: Cigarettes    Quit date: 01/24/1968    Years since quitting: 51.1  . Smokeless tobacco: Never Used  Substance and Sexual Activity  . Alcohol use: No    Alcohol/week: 0.0 standard drinks  . Drug use: No  . Sexual activity: Yes  Other Topics Concern  . Not on file  Social History Narrative   Lives at home with husband    Social Determinants of Health   Financial Resource Strain:   . Difficulty of Paying Living Expenses: Not on file  Food Insecurity:   . Worried About Charity fundraiser in the Last Year: Not asked  . Ran Out of Food in the Last Year: Not asked  Transportation Needs:   .  Lack of Transportation (Medical): Not on file  . Lack of Transportation (Non-Medical): Not on file  Physical Activity:   . Days of Exercise per Week: Not on file  . Minutes of Exercise per Session: Not on file  Stress:   . Feeling of Stress : Not on file  Social Connections:   . Frequency of Communication with Friends and Family: Not on file  . Frequency of Social Gatherings with Friends and Family: Not on file  . Attends Religious Services: Not on file  . Active Member of Clubs or Organizations: Not on file  . Attends Archivist Meetings: Not on file  . Marital Status: Not on file  Intimate Partner Violence:   . Fear of Current or Ex-Partner: Not on file  . Emotionally Abused: Not on file  . Physically Abused: Not on  file  . Sexually Abused: Not on file    Outpatient Encounter Medications as of 03/24/2019  Medication Sig  . albuterol (VENTOLIN HFA) 108 (90 Base) MCG/ACT inhaler TAKE 2 PUFFS BY MOUTH EVERY 6 HOURS AS NEEDED FOR WHEEZE OR SHORTNESS OF BREATH  . Apoaequorin (PREVAGEN PO) Take 1 tablet by mouth daily.  Marland Kitchen aspirin 81 MG tablet Take 81 mg by mouth daily.    Marland Kitchen atenolol (TENORMIN) 25 MG tablet Take 0.5 tablets (12.5 mg total) by mouth daily. (Needs to be seen before next refill)  . atorvastatin (LIPITOR) 10 MG tablet Take 1 tablet (10 mg total) by mouth daily. (Needs to be seen before next refill)  . cetirizine (ZYRTEC) 10 MG tablet Take 10 mg by mouth daily as needed.   . cholecalciferol (VITAMIN D) 1000 units tablet Take 1 tablet (1,000 Units total) by mouth daily.  . Cyanocobalamin (B-12) 3000 MCG CAPS Take 1 capsule by mouth daily.  . diclofenac (VOLTAREN) 75 MG EC tablet Take 1 tablet (75 mg total) by mouth 2 (two) times daily.  Marland Kitchen docusate sodium (COLACE) 100 MG capsule Take 100 mg by mouth daily as needed for mild constipation.  . hydrochlorothiazide (HYDRODIURIL) 25 MG tablet TAKE 1 TABLET BY MOUTH EVERY DAY  . hydrOXYzine (ATARAX/VISTARIL) 25 MG tablet Take 1 tablet (25 mg total) by mouth 3 (three) times daily as needed.  . magnesium oxide (MAG-OX) 400 MG tablet Take 400 mg by mouth daily.  . [DISCONTINUED] atenolol (TENORMIN) 25 MG tablet Take 1 tablet (25 mg total) by mouth daily. (Needs to be seen before next refill)  . fluticasone (FLONASE) 50 MCG/ACT nasal spray Place 2 sprays into both nostrils daily. (Patient not taking: Reported on 03/24/2019)  . meclizine (ANTIVERT) 25 MG tablet Take 1 tablet (25 mg total) by mouth 3 (three) times daily as needed for dizziness. (Patient not taking: Reported on 03/24/2019)  . rOPINIRole (REQUIP) 0.25 MG tablet Take 1 tablet (0.25 mg total) by mouth at bedtime.   No facility-administered encounter medications on file as of 03/24/2019.    Allergies    Allergen Reactions  . Adhesive [Tape] Itching and Rash  . Latex Rash    Review of Systems  Constitutional: Negative for activity change, appetite change, chills, diaphoresis, fatigue, fever and unexpected weight change.  HENT: Negative.   Eyes: Negative.  Negative for photophobia and visual disturbance.  Respiratory: Negative for cough, chest tightness and shortness of breath.   Cardiovascular: Negative for chest pain, palpitations and leg swelling.  Gastrointestinal: Negative for abdominal pain, blood in stool, constipation, diarrhea, nausea and vomiting.  Endocrine: Negative.  Negative for cold intolerance, heat  intolerance, polydipsia, polyphagia and polyuria.  Genitourinary: Negative for decreased urine volume, difficulty urinating, dysuria, frequency and urgency.  Musculoskeletal: Positive for myalgias. Negative for arthralgias.  Skin: Negative for color change and rash.       Pruritis  Allergic/Immunologic: Negative.   Neurological: Positive for dizziness. Negative for tremors, seizures, syncope, facial asymmetry, speech difficulty, weakness, light-headedness, numbness and headaches.  Hematological: Negative.   Psychiatric/Behavioral: Negative for confusion, hallucinations, sleep disturbance and suicidal ideas. The patient is nervous/anxious.   All other systems reviewed and are negative.       Objective:  BP 98/64   Pulse 67   Temp 98.2 F (36.8 C)   Resp 20   Ht '5\' 4"'$  (1.626 m)   Wt 186 lb (84.4 kg)   SpO2 98%   BMI 31.93 kg/m    Wt Readings from Last 3 Encounters:  03/24/19 186 lb (84.4 kg)  11/05/18 187 lb (84.8 kg)  08/08/18 183 lb (83 kg)    Physical Exam Vitals and nursing note reviewed.  Constitutional:      General: She is not in acute distress.    Appearance: Normal appearance. She is well-developed and well-groomed. She is obese. She is not ill-appearing, toxic-appearing or diaphoretic.  HENT:     Head: Normocephalic and atraumatic.     Jaw: There  is normal jaw occlusion.     Right Ear: Hearing normal.     Left Ear: Hearing normal.     Nose: Nose normal.     Mouth/Throat:     Lips: Pink.     Mouth: Mucous membranes are moist.     Pharynx: Oropharynx is clear. Uvula midline.  Eyes:     General: Lids are normal.     Extraocular Movements: Extraocular movements intact.     Conjunctiva/sclera: Conjunctivae normal.     Pupils: Pupils are equal, round, and reactive to light.  Neck:     Thyroid: No thyroid mass, thyromegaly or thyroid tenderness.     Vascular: No carotid bruit or JVD.     Trachea: Trachea and phonation normal.  Cardiovascular:     Rate and Rhythm: Normal rate and regular rhythm.     Chest Wall: PMI is not displaced.     Pulses: Normal pulses.     Heart sounds: Normal heart sounds. No murmur. No friction rub. No gallop.   Pulmonary:     Effort: Pulmonary effort is normal. No respiratory distress.     Breath sounds: Normal breath sounds. No wheezing.  Abdominal:     General: Bowel sounds are normal. There is no distension or abdominal bruit.     Palpations: Abdomen is soft. There is no hepatomegaly or splenomegaly.     Tenderness: There is no abdominal tenderness. There is no right CVA tenderness or left CVA tenderness.     Hernia: No hernia is present.  Musculoskeletal:        General: Normal range of motion.     Cervical back: Normal range of motion and neck supple.     Right lower leg: No edema.     Left lower leg: No edema.  Lymphadenopathy:     Cervical: No cervical adenopathy.  Skin:    General: Skin is warm and dry.     Capillary Refill: Capillary refill takes less than 2 seconds.     Coloration: Skin is not cyanotic, jaundiced or pale.     Findings: No rash.  Neurological:     General: No focal deficit  present.     Mental Status: She is alert and oriented to person, place, and time.     Cranial Nerves: Cranial nerves are intact. No cranial nerve deficit.     Sensory: Sensation is intact. No sensory  deficit.     Motor: Motor function is intact. No weakness.     Coordination: Coordination is intact. Coordination normal.     Gait: Gait is intact. Gait normal.     Deep Tendon Reflexes: Reflexes are normal and symmetric. Reflexes normal.  Psychiatric:        Attention and Perception: Attention and perception normal.        Mood and Affect: Mood and affect normal.        Speech: Speech normal.        Behavior: Behavior normal. Behavior is cooperative.        Thought Content: Thought content normal.        Cognition and Memory: Cognition and memory normal.        Judgment: Judgment normal.     Results for orders placed or performed in visit on 12/03/18  Novel Coronavirus, NAA ([facility])   Specimen: Nasopharyngeal(NP) swabs in vial transport medium   NASOPHARYNGE  TESTING  Result Value Ref Range   SARS-CoV-2, NAA Not Detected Not Detected       Pertinent labs & imaging results that were available during my care of the patient were reviewed by me and considered in my medical decision making.  Assessment & Plan:  Laura James was seen today for rls and anxiety / itching.  Diagnoses and all orders for this visit:  Essential hypertension, benign BP low in office today and reported low readings at home. Will change atenolol to 12.5 mg daily. Pt aware to monitor BP and HR and to report any persistent high or low readings. Labs pending.  -     atenolol (TENORMIN) 25 MG tablet; Take 0.5 tablets (12.5 mg total) by mouth daily. (Needs to be seen before next refill) -     CMP14+EGFR -     CBC with Differential/Platelet -     Thyroid Panel With TSH  Paresthesia of bilateral legs RLS (restless legs syndrome) Pruritus Ongoing and worsening paresthesias, pruritis, and RLS of bilateral lower extremities. Will check for electrolyte and vitamin/mineral deficiencies today. Will check for underlying thyroid disease and anemia. Low dose Requip nightly. Pt aware to report any new, worsening, or persistent  symptoms. Can take Vistaril as needed for ongoing pruritis. -     CMP14+EGFR -     CBC with Differential/Platelet -     Thyroid Panel With TSH -     VITAMIN D 25 Hydroxy (Vit-D Deficiency, Fractures) -     Vitamin B12 -     Magnesium -     rOPINIRole (REQUIP) 0.25 MG tablet; Take 1 tablet (0.25 mg total) by mouth at bedtime.     Continue all other maintenance medications.  Follow up plan: Return in about 2 weeks (around 04/07/2019), or if symptoms worsen or fail to improve.  Continue healthy lifestyle choices, including diet (rich in fruits, vegetables, and lean proteins, and low in salt and simple carbohydrates) and exercise (at least 30 minutes of moderate physical activity daily).  Educational handout given for RLS  The above assessment and management plan was discussed with the patient. The patient verbalized understanding of and has agreed to the management plan. Patient is aware to call the clinic if they develop any new symptoms or if  symptoms persist or worsen. Patient is aware when to return to the clinic for a follow-up visit. Patient educated on when it is appropriate to go to the emergency department.   Monia Pouch, FNP-C [facility] [phone]

## 2019-03-25 LAB — CBC WITH DIFFERENTIAL/PLATELET
Basophils Absolute: 0 10*3/uL (ref 0.0–0.2)
Basos: 1 %
EOS (ABSOLUTE): 0.6 10*3/uL — ABNORMAL HIGH (ref 0.0–0.4)
Eos: 7 %
Hematocrit: 31 % — ABNORMAL LOW (ref 34.0–46.6)
Hemoglobin: 9.5 g/dL — ABNORMAL LOW (ref 11.1–15.9)
Immature Grans (Abs): 0 10*3/uL (ref 0.0–0.1)
Immature Granulocytes: 0 %
Lymphocytes Absolute: 2.8 10*3/uL (ref 0.7–3.1)
Lymphs: 34 %
MCH: 22.7 pg — ABNORMAL LOW (ref 26.6–33.0)
MCHC: 30.6 g/dL — ABNORMAL LOW (ref 31.5–35.7)
MCV: 74 fL — ABNORMAL LOW (ref 79–97)
Monocytes Absolute: 0.6 10*3/uL (ref 0.1–0.9)
Monocytes: 7 %
Neutrophils Absolute: 4.3 10*3/uL (ref 1.4–7.0)
Neutrophils: 51 %
Platelets: 372 10*3/uL (ref 150–450)
RBC: 4.19 x10E6/uL (ref 3.77–5.28)
RDW: 14.1 % (ref 11.7–15.4)
WBC: 8.3 10*3/uL (ref 3.4–10.8)

## 2019-03-25 LAB — THYROID PANEL WITH TSH
Free Thyroxine Index: 1.7 (ref 1.2–4.9)
T3 Uptake Ratio: 23 % — ABNORMAL LOW (ref 24–39)
T4, Total: 7.5 ug/dL (ref 4.5–12.0)
TSH: 1.8 u[IU]/mL (ref 0.450–4.500)

## 2019-03-25 LAB — CMP14+EGFR
ALT: 15 IU/L (ref 0–32)
AST: 23 IU/L (ref 0–40)
Albumin/Globulin Ratio: 1.6 (ref 1.2–2.2)
Albumin: 3.9 g/dL (ref 3.8–4.8)
Alkaline Phosphatase: 82 IU/L (ref 39–117)
BUN/Creatinine Ratio: 12 (ref 12–28)
BUN: 15 mg/dL (ref 8–27)
Bilirubin Total: <0.2 mg/dL (ref 0.0–1.2)
CO2: 23 mmol/L (ref 20–29)
Calcium: 9.7 mg/dL (ref 8.7–10.3)
Chloride: 104 mmol/L (ref 96–106)
Creatinine, Ser: 1.26 mg/dL — ABNORMAL HIGH (ref 0.57–1.00)
GFR calc Af Amer: 50 mL/min/{1.73_m2} — ABNORMAL LOW (ref >59)
GFR calc non Af Amer: 43 mL/min/{1.73_m2} — ABNORMAL LOW (ref >59)
Globulin, Total: 2.5 g/dL (ref 1.5–4.5)
Glucose: 86 mg/dL (ref 65–99)
Potassium: 3.6 mmol/L (ref 3.5–5.2)
Sodium: 143 mmol/L (ref 134–144)
Total Protein: 6.4 g/dL (ref 6.0–8.5)

## 2019-03-25 LAB — VITAMIN B12: Vitamin B-12: >2000 pg/mL — ABNORMAL HIGH (ref 232–1245)

## 2019-03-25 LAB — VITAMIN D 25 HYDROXY (VIT D DEFICIENCY, FRACTURES): Vit D, 25-Hydroxy: 46.4 ng/mL (ref 30.0–100.0)

## 2019-03-25 LAB — MAGNESIUM: Magnesium: 2.2 mg/dL (ref 1.6–2.3)

## 2019-03-31 ENCOUNTER — Ambulatory Visit: Payer: Medicare Other | Admitting: Family

## 2019-04-07 ENCOUNTER — Ambulatory Visit (INDEPENDENT_AMBULATORY_CARE_PROVIDER_SITE_OTHER): Payer: Medicare Other | Admitting: Family Medicine

## 2019-04-07 ENCOUNTER — Other Ambulatory Visit: Payer: Self-pay

## 2019-04-07 ENCOUNTER — Encounter: Payer: Self-pay | Admitting: Family Medicine

## 2019-04-07 VITALS — BP 100/64 | HR 73 | Temp 98.9°F | Resp 20 | Ht 64.0 in | Wt 184.0 lb

## 2019-04-07 DIAGNOSIS — G2581 Restless legs syndrome: Secondary | ICD-10-CM | POA: Insufficient documentation

## 2019-04-07 DIAGNOSIS — N289 Disorder of kidney and ureter, unspecified: Secondary | ICD-10-CM

## 2019-04-07 DIAGNOSIS — D649 Anemia, unspecified: Secondary | ICD-10-CM

## 2019-04-07 DIAGNOSIS — R6889 Other general symptoms and signs: Secondary | ICD-10-CM | POA: Diagnosis not present

## 2019-04-07 DIAGNOSIS — D508 Other iron deficiency anemias: Secondary | ICD-10-CM | POA: Diagnosis not present

## 2019-04-07 NOTE — Patient Instructions (Signed)
Anemia  Anemia is a condition in which you do not have enough red blood cells or hemoglobin. Hemoglobin is a substance in red blood cells that carries oxygen. When you do not have enough red blood cells or hemoglobin (are anemic), your body cannot get enough oxygen and your organs may not work properly. As a result, you may feel very tired or have other problems. What are the causes? Common causes of anemia include:  Excessive bleeding. Anemia can be caused by excessive bleeding inside or outside the body, including bleeding from the intestine or from periods in women.  Poor nutrition.  Long-lasting (chronic) kidney, thyroid, and liver disease.  Bone marrow disorders.  Cancer and treatments for cancer.  HIV (human immunodeficiency virus) and AIDS (acquired immunodeficiency syndrome).  Treatments for HIV and AIDS.  Spleen problems.  Blood disorders.  Infections, medicines, and autoimmune disorders that destroy red blood cells. What are the signs or symptoms? Symptoms of this condition include:  Minor weakness.  Dizziness.  Headache.  Feeling heartbeats that are irregular or faster than normal (palpitations).  Shortness of breath, especially with exercise.  Paleness.  Cold sensitivity.  Indigestion.  Nausea.  Difficulty sleeping.  Difficulty concentrating. Symptoms may occur suddenly or develop slowly. If your anemia is mild, you may not have symptoms. How is this diagnosed? This condition is diagnosed based on:  Blood tests.  Your medical history.  A physical exam.  Bone marrow biopsy. Your health care provider may also check your stool (feces) for blood and may do additional testing to look for the cause of your bleeding. You may also have other tests, including:  Imaging tests, such as a CT scan or MRI.  Endoscopy.  Colonoscopy. How is this treated? Treatment for this condition depends on the cause. If you continue to lose a lot of blood, you may  need to be treated at a hospital. Treatment may include:  Taking supplements of iron, vitamin S31, or folic acid.  Taking a hormone medicine (erythropoietin) that can help to stimulate red blood cell growth.  Having a blood transfusion. This may be needed if you lose a lot of blood.  Making changes to your diet.  Having surgery to remove your spleen. Follow these instructions at home:  Take over-the-counter and prescription medicines only as told by your health care provider.  Take supplements only as told by your health care provider.  Follow any diet instructions that you were given.  Keep all follow-up visits as told by your health care provider. This is important. Contact a health care provider if:  You develop new bleeding anywhere in the body. Get help right away if:  You are very weak.  You are short of breath.  You have pain in your abdomen or chest.  You are dizzy or feel faint.  You have trouble concentrating.  You have bloody or black, tarry stools.  You vomit repeatedly or you vomit up blood. Summary  Anemia is a condition in which you do not have enough red blood cells or enough of a substance in your red blood cells that carries oxygen (hemoglobin).  Symptoms may occur suddenly or develop slowly.  If your anemia is mild, you may not have symptoms.  This condition is diagnosed with blood tests as well as a medical history and physical exam. Other tests may be needed.  Treatment for this condition depends on the cause of the anemia. This information is not intended to replace advice given to you by  your health care provider. Make sure you discuss any questions you have with your health care provider. Document Revised: 12/22/2016 Document Reviewed: 02/11/2016 Elsevier Patient Education  [employer].

## 2019-04-07 NOTE — Progress Notes (Signed)
Subjective:  Patient ID: Laura James, female    DOB: December 03, 1950, 69 y.o.   MRN: [id number]  Patient Care Team: Sharion Balloon, FNP as PCP - General (Family Medicine)   Chief Complaint:  Medical Management of Chronic Issues (RLS, HTN , rash follow up )   HPI: Laura James is a 69 y.o. female presenting on 04/07/2019 for Medical Management of Chronic Issues (RLS, HTN , rash follow up )  Pt following up today for dizziness, RLS, and exertional shortness of breath. Pt states she took the Requip for her RLS and it seemed to make it worse so she stopped it. States she has been using a weighted blanket and this is beneficial. Pt states she continues to have positional dizziness and exertional shortness of breath. States this has not changed. No new or worsening symptoms. She has not had any abnormal bleeding or bruising, changes in stool color, melena, or hematochezia. No hematemesis.   Relevant past medical, surgical, family, and social history reviewed and updated as indicated.  Allergies and medications reviewed and updated. Date reviewed: Chart in Epic.   Past Medical History:  Diagnosis Date  . Barrett esophagus   . 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 Depression   . Diverticulosis of colon (without mention of hemorrhage)   . Esophageal reflux   . Essential hypertension, benign   . Gastritis   . Grave's disease   . Hiatal hernia   . Hypothyroidism   . IBS (irritable bowel syndrome)   . Obesity   . Osteopenia   . Sleep apnea   . Symptomatic menopausal or female climacteric states     Past Surgical History:  Procedure Laterality Date  . ABDOMINAL HYSTERECTOMY N/A   . CHOLECYSTECTOMY    . INCONTINENCE SURGERY    . TONSILLECTOMY      Social History   Socioeconomic History  . Marital status: Married    Spouse name: Not on file  . Number of children: 3  . Years of education: 80  . Highest education level: Master's degree  (e.g., MA, MS, MEng, MEd, MSW, MBA)  Occupational History  . Occupation: Hospice- Chaplain    Comment: [employer]  . Occupation: retired  Tobacco Use  . Smoking status: Former Smoker    Packs/day: 0.50    Years: 5.00    Pack years: 2.50    Types: Cigarettes    Quit date: 01/24/1968    Years since quitting: 51.2  . Smokeless tobacco: Never Used  Substance and Sexual Activity  . Alcohol use: No    Alcohol/week: 0.0 standard drinks  . Drug use: No  . Sexual activity: Yes  Other Topics Concern  . Not on file  Social History Narrative   Lives at home with husband    Social Determinants of Health   Financial Resource Strain:   . Difficulty of Paying Living Expenses:   Food Insecurity:   . Worried About Charity fundraiser in the Last Year:   . Arboriculturist in the Last Year:   Transportation Needs:   . Film/video editor (Medical):   Marland Kitchen Lack of Transportation (Non-Medical):   Physical Activity:   . Days of Exercise per Week:   . Minutes of Exercise per Session:   Stress:   . Feeling of Stress :   Social Connections:   . Frequency of Communication with Friends and Family:   .  Frequency of Social Gatherings with Friends and Family:   . Attends Religious Services:   . Active Member of Clubs or Organizations:   . Attends Archivist Meetings:   Marland Kitchen Marital Status:   Intimate Partner Violence:   . Fear of Current or Ex-Partner:   . Emotionally Abused:   Marland Kitchen Physically Abused:   . Sexually Abused:     Outpatient Encounter Medications as of 04/07/2019  Medication Sig  . albuterol (VENTOLIN HFA) 108 (90 Base) MCG/ACT inhaler TAKE 2 PUFFS BY MOUTH EVERY 6 HOURS AS NEEDED FOR WHEEZE OR SHORTNESS OF BREATH  . Apoaequorin (PREVAGEN PO) Take 1 tablet by mouth daily.  Marland Kitchen aspirin 81 MG tablet Take 81 mg by mouth daily.    Marland Kitchen atenolol (TENORMIN) 25 MG tablet Take 0.5 tablets (12.5 mg total) by mouth daily. (Needs to be seen before next refill)  . atorvastatin  (LIPITOR) 10 MG tablet Take 1 tablet (10 mg total) by mouth daily. (Needs to be seen before next refill)  . cetirizine (ZYRTEC) 10 MG tablet Take 10 mg by mouth daily as needed.   . cholecalciferol (VITAMIN D) 1000 units tablet Take 1 tablet (1,000 Units total) by mouth daily.  . Cyanocobalamin (B-12) 3000 MCG CAPS Take 1 capsule by mouth daily.  . diclofenac (VOLTAREN) 75 MG EC tablet Take 1 tablet (75 mg total) by mouth 2 (two) times daily.  Marland Kitchen docusate sodium (COLACE) 100 MG capsule Take 100 mg by mouth daily as needed for mild constipation.  . fluticasone (FLONASE) 50 MCG/ACT nasal spray Place 2 sprays into both nostrils daily.  . hydrochlorothiazide (HYDRODIURIL) 25 MG tablet TAKE 1 TABLET BY MOUTH EVERY DAY  . magnesium oxide (MAG-OX) 400 MG tablet Take 400 mg by mouth daily.  . meclizine (ANTIVERT) 25 MG tablet Take 1 tablet (25 mg total) by mouth 3 (three) times daily as needed for dizziness.  . [DISCONTINUED] hydrOXYzine (ATARAX/VISTARIL) 25 MG tablet Take 1 tablet (25 mg total) by mouth 3 (three) times daily as needed.  . [DISCONTINUED] rOPINIRole (REQUIP) 0.25 MG tablet Take 1 tablet (0.25 mg total) by mouth at bedtime.   No facility-administered encounter medications on file as of 04/07/2019.    Allergies  Allergen Reactions  . Adhesive [Tape] Itching and Rash  . Latex Rash    Review of Systems  Constitutional: Positive for fatigue. Negative for activity change, appetite change, chills, diaphoresis, fever and unexpected weight change.  HENT: Negative.   Eyes: Negative.  Negative for photophobia and visual disturbance.  Respiratory: Positive for shortness of breath (exertional). Negative for cough and chest tightness.   Cardiovascular: Negative for chest pain, palpitations and leg swelling.  Gastrointestinal: Negative for abdominal pain, anal bleeding, blood in stool, constipation, diarrhea, nausea and vomiting.  Endocrine: Negative.   Genitourinary: Negative for decreased  urine volume, difficulty urinating, dysuria, frequency, hematuria, urgency and vaginal bleeding.  Musculoskeletal: Positive for myalgias. Negative for arthralgias.  Skin: Negative.  Negative for color change and pallor.  Allergic/Immunologic: Negative.   Neurological: Positive for dizziness (positional). Negative for tremors, seizures, syncope, facial asymmetry, speech difficulty, weakness, light-headedness, numbness and headaches.  Hematological: Negative.  Does not bruise/bleed easily.  Psychiatric/Behavioral: Negative for confusion, hallucinations, sleep disturbance and suicidal ideas.  All other systems reviewed and are negative.       Objective:  BP 100/64   Pulse 73   Temp 98.9 F (37.2 C)   Resp 20   Ht 5' 4"  (1.626 m)   Wt 184  lb (83.5 kg)   SpO2 99%   BMI 31.58 kg/m    Wt Readings from Last 3 Encounters:  04/07/19 184 lb (83.5 kg)  03/24/19 186 lb (84.4 kg)  11/05/18 187 lb (84.8 kg)    Physical Exam Vitals and nursing note reviewed.  Constitutional:      General: She is not in acute distress.    Appearance: Normal appearance. She is well-developed and well-groomed. She is obese. She is not ill-appearing, toxic-appearing or diaphoretic.  HENT:     Head: Normocephalic and atraumatic.     Jaw: There is normal jaw occlusion.     Right Ear: Hearing normal.     Left Ear: Hearing normal.     Nose: Nose normal.     Mouth/Throat:     Lips: Pink.     Mouth: Mucous membranes are moist.     Pharynx: Oropharynx is clear. Uvula midline.  Eyes:     General: Lids are normal.     Extraocular Movements: Extraocular movements intact.     Conjunctiva/sclera: Conjunctivae normal.     Pupils: Pupils are equal, round, and reactive to light.  Neck:     Thyroid: No thyroid mass, thyromegaly or thyroid tenderness.     Vascular: No carotid bruit or JVD.     Trachea: Trachea and phonation normal.  Cardiovascular:     Rate and Rhythm: Normal rate and regular rhythm.     Chest  Wall: PMI is not displaced.     Pulses: Normal pulses.     Heart sounds: Normal heart sounds. No murmur. No friction rub. No gallop.   Pulmonary:     Effort: Pulmonary effort is normal. No respiratory distress.     Breath sounds: Normal breath sounds. No wheezing.  Abdominal:     General: Bowel sounds are normal. There is no distension or abdominal bruit.     Palpations: Abdomen is soft. There is no hepatomegaly or splenomegaly.     Tenderness: There is no abdominal tenderness. There is no right CVA tenderness or left CVA tenderness.     Hernia: No hernia is present.  Musculoskeletal:        General: Normal range of motion.     Cervical back: Normal range of motion and neck supple.     Right lower leg: No edema.     Left lower leg: No edema.  Lymphadenopathy:     Cervical: No cervical adenopathy.  Skin:    General: Skin is warm and dry.     Capillary Refill: Capillary refill takes less than 2 seconds.     Coloration: Skin is not cyanotic, jaundiced or pale.     Findings: No rash.  Neurological:     General: No focal deficit present.     Mental Status: She is alert and oriented to person, place, and time.     Cranial Nerves: Cranial nerves are intact. No cranial nerve deficit.     Sensory: Sensation is intact. No sensory deficit.     Motor: Motor function is intact. No weakness.     Coordination: Coordination is intact. Coordination normal.     Gait: Gait is intact. Gait normal.     Deep Tendon Reflexes: Reflexes are normal and symmetric. Reflexes normal.  Psychiatric:        Attention and Perception: Attention and perception normal.        Mood and Affect: Mood and affect normal.        Speech: Speech normal.  Behavior: Behavior normal. Behavior is cooperative.        Thought Content: Thought content normal.        Cognition and Memory: Cognition and memory normal.        Judgment: Judgment normal.     Results for orders placed or performed in visit on 03/24/19    CMP14+EGFR  Result Value Ref Range   Glucose 86 65 - 99 mg/dL   BUN 15 8 - 27 mg/dL   Creatinine, Ser 1.26 (H) 0.57 - 1.00 mg/dL   GFR calc non Af Amer 43 (L) >59 mL/min/1.73   GFR calc Af Amer 50 (L) >59 mL/min/1.73   BUN/Creatinine Ratio 12 12 - 28   Sodium 143 134 - 144 mmol/L   Potassium 3.6 3.5 - 5.2 mmol/L   Chloride 104 96 - 106 mmol/L   CO2 23 20 - 29 mmol/L   Calcium 9.7 8.7 - 10.3 mg/dL   Total Protein 6.4 6.0 - 8.5 g/dL   Albumin 3.9 3.8 - 4.8 g/dL   Globulin, Total 2.5 1.5 - 4.5 g/dL   Albumin/Globulin Ratio 1.6 1.2 - 2.2   Bilirubin Total <0.2 0.0 - 1.2 mg/dL   Alkaline Phosphatase 82 39 - 117 IU/L   AST 23 0 - 40 IU/L   ALT 15 0 - 32 IU/L  CBC with Differential/Platelet  Result Value Ref Range   WBC 8.3 3.4 - 10.8 x10E3/uL   RBC 4.19 3.77 - 5.28 x10E6/uL   Hemoglobin 9.5 (L) 11.1 - 15.9 g/dL   Hematocrit 31.0 (L) 34.0 - 46.6 %   MCV 74 (L) 79 - 97 fL   MCH 22.7 (L) 26.6 - 33.0 pg   MCHC 30.6 (L) 31.5 - 35.7 g/dL   RDW 14.1 11.7 - 15.4 %   Platelets 372 150 - 450 x10E3/uL   Neutrophils 51 Not Estab. %   Lymphs 34 Not Estab. %   Monocytes 7 Not Estab. %   Eos 7 Not Estab. %   Basos 1 Not Estab. %   Neutrophils Absolute 4.3 1.4 - 7.0 x10E3/uL   Lymphocytes Absolute 2.8 0.7 - 3.1 x10E3/uL   Monocytes Absolute 0.6 0.1 - 0.9 x10E3/uL   EOS (ABSOLUTE) 0.6 (H) 0.0 - 0.4 x10E3/uL   Basophils Absolute 0.0 0.0 - 0.2 x10E3/uL   Immature Granulocytes 0 Not Estab. %   Immature Grans (Abs) 0.0 0.0 - 0.1 x10E3/uL  Thyroid Panel With TSH  Result Value Ref Range   TSH 1.800 0.450 - 4.500 uIU/mL   T4, Total 7.5 4.5 - 12.0 ug/dL   T3 Uptake Ratio 23 (L) 24 - 39 %   Free Thyroxine Index 1.7 1.2 - 4.9  VITAMIN D 25 Hydroxy (Vit-D Deficiency, Fractures)  Result Value Ref Range   Vit D, 25-Hydroxy 46.4 30.0 - 100.0 ng/mL  Vitamin B12  Result Value Ref Range   Vitamin B-12 >2000 (H) 232 - 1245 pg/mL  Magnesium  Result Value Ref Range   Magnesium 2.2 1.6 - 2.3 mg/dL        Pertinent labs & imaging results that were available during my care of the patient were reviewed by me and considered in my medical decision making.  Assessment & Plan:  Lilyana was seen today for medical management of chronic issues.  Diagnoses and all orders for this visit:  RLS (restless legs syndrome) Has improved some with use of weighted blanket. Not taking Requip as this made symptoms worse.   Anemia, unspecified type No obvious cause  of anemia. Will check anemia profile and FOBT. Treatment depending on results.  -     Anemia Profile B -     Fecal occult blood, imunochemical; Future  Abnormal renal function Adequate hydration discussed in detail. Labs pending.  -     BMP8+EGFR     Continue all other maintenance medications.  Follow up plan: Return if symptoms worsen or fail to improve.  Continue healthy lifestyle choices, including diet (rich in fruits, vegetables, and lean proteins, and low in salt and simple carbohydrates) and exercise (at least 30 minutes of moderate physical activity daily).  Educational handout given for anemia  The above assessment and management plan was discussed with the patient. The patient verbalized understanding of and has agreed to the management plan. Patient is aware to call the clinic if they develop any new symptoms or if symptoms persist or worsen. Patient is aware when to return to the clinic for a follow-up visit. Patient educated on when it is appropriate to go to the emergency department.   Monia Pouch, FNP-C [facility] [phone]

## 2019-04-08 ENCOUNTER — Other Ambulatory Visit: Payer: Medicare Other

## 2019-04-08 DIAGNOSIS — D649 Anemia, unspecified: Secondary | ICD-10-CM

## 2019-04-08 LAB — ANEMIA PROFILE B
Basophils Absolute: 0 10*3/uL (ref 0.0–0.2)
Basos: 0 %
EOS (ABSOLUTE): 0.6 10*3/uL — ABNORMAL HIGH (ref 0.0–0.4)
Eos: 7 %
Ferritin: 10 ng/mL — ABNORMAL LOW (ref 15–150)
Folate: 12.3 ng/mL (ref >3.0)
Hematocrit: 33.8 % — ABNORMAL LOW (ref 34.0–46.6)
Hemoglobin: 9.5 g/dL — ABNORMAL LOW (ref 11.1–15.9)
Immature Grans (Abs): 0 10*3/uL (ref 0.0–0.1)
Immature Granulocytes: 0 %
Iron Saturation: 5 % — CL (ref 15–55)
Iron: 18 ug/dL — ABNORMAL LOW (ref 27–139)
Lymphocytes Absolute: 2.2 10*3/uL (ref 0.7–3.1)
Lymphs: 25 %
MCH: 21.9 pg — ABNORMAL LOW (ref 26.6–33.0)
MCHC: 28.1 g/dL — ABNORMAL LOW (ref 31.5–35.7)
MCV: 78 fL — ABNORMAL LOW (ref 79–97)
Monocytes Absolute: 0.5 10*3/uL (ref 0.1–0.9)
Monocytes: 6 %
Neutrophils Absolute: 5.3 10*3/uL (ref 1.4–7.0)
Neutrophils: 62 %
Platelets: 350 10*3/uL (ref 150–450)
RBC: 4.33 x10E6/uL (ref 3.77–5.28)
RDW: 14.3 % (ref 11.7–15.4)
Retic Ct Pct: 1.9 % (ref 0.6–2.6)
Total Iron Binding Capacity: 382 ug/dL (ref 250–450)
UIBC: 364 ug/dL (ref 118–369)
Vitamin B-12: >2000 pg/mL — ABNORMAL HIGH (ref 232–1245)
WBC: 8.6 10*3/uL (ref 3.4–10.8)

## 2019-04-08 LAB — BMP8+EGFR
BUN/Creatinine Ratio: 14 (ref 12–28)
BUN: 14 mg/dL (ref 8–27)
CO2: 24 mmol/L (ref 20–29)
Calcium: 9.4 mg/dL (ref 8.7–10.3)
Chloride: 101 mmol/L (ref 96–106)
Creatinine, Ser: 1.02 mg/dL — ABNORMAL HIGH (ref 0.57–1.00)
GFR calc Af Amer: 64 mL/min/{1.73_m2} (ref >59)
GFR calc non Af Amer: 56 mL/min/{1.73_m2} — ABNORMAL LOW (ref >59)
Glucose: 121 mg/dL — ABNORMAL HIGH (ref 65–99)
Potassium: 3.8 mmol/L (ref 3.5–5.2)
Sodium: 139 mmol/L (ref 134–144)

## 2019-04-08 MED ORDER — IRON (FERROUS SULFATE) 325 (65 FE) MG PO TABS: 325.0000 mg | ORAL_TABLET | Freq: Two times a day (BID) | ORAL | 5 refills | Status: DC

## 2019-04-08 NOTE — Addendum Note (Signed)
Addended by: Baruch Gouty on: 04/08/2019 07:45 AM   Modules accepted: Orders

## 2019-04-09 LAB — FECAL OCCULT BLOOD, IMMUNOCHEMICAL: Fecal Occult Bld: NEGATIVE

## 2019-04-10 ENCOUNTER — Other Ambulatory Visit: Payer: Self-pay | Admitting: Family

## 2019-04-15 ENCOUNTER — Other Ambulatory Visit: Payer: Self-pay

## 2019-04-15 NOTE — Patient Outreach (Signed)
[facility]) Care Management  04/15/2019  Meilin Licursi Sider April 26, 1950 [id number]   Medication Adherence call to Mrs. Izora Gala [facility] [employer] spoke with patient she is past due on Atorvastatin 10 mg by 45 days patient explain she takes 1 tablet daily there is times when she only takes 1/2 tablet,patient said she has two month left at this time. Mrs. Varriale is showing past due under [place].   [place] [facility] Direct Dial [phone]  Fax [phone] [email or web address]

## 2019-04-22 ENCOUNTER — Telehealth: Payer: Self-pay | Admitting: Family

## 2019-04-22 NOTE — Chronic Care Management (AMB) (Signed)
  Chronic Care Management   Outreach Note  04/22/2019 Name: Laura James MRN: [id number] DOB: March 14, 1950  Laura James is a 69 y.o. year old female who is a primary care patient of Sharion Balloon, FNP. I reached out to [place] by phone today in response to a referral sent by Laura James's health plan.     An unsuccessful telephone outreach was attempted today. The patient was referred to the case management team for assistance with care management and care coordination.   Follow Up Plan: A HIPPA compliant phone message was left for the patient providing contact information and requesting a return call. The care management team will reach out to the patient again over the next 7 days. If patient returns call to provider office, please advise to call [facility] at [phone].  [place], [place] [address] Direct Dial: [phone] [email or web address]

## 2019-04-24 NOTE — Chronic Care Management (AMB) (Signed)
  Chronic Care Management   Note  04/24/2019 Name: REVE CROCKET MRN: [id number] DOB: 11-06-1950  Reita Chard Mataya is a 69 y.o. year old female who is a primary care patient of Sharion Balloon, FNP. I reached out to [place] by phone today in response to a referral sent by Ms. Reita Chard [place]'s health plan.     Ms. Schillaci was given information about Chronic Care Management services today including:  1. CCM service includes personalized support from designated clinical staff supervised by her physician, including individualized plan of care and coordination with other care providers 2. 24/7 contact phone numbers for assistance for urgent and routine care needs. 3. Service will only be billed when office clinical staff spend 20 minutes or more in a month to coordinate care. 4. Only one practitioner may furnish and bill the service in a calendar month. 5. The patient may stop CCM services at any time (effective at the end of the month) by phone call to the office staff. 6. The patient will be responsible for cost sharing (co-pay) of up to 20% of the service fee (after annual deductible is met).  Patient agreed to services and verbal consent obtained.   Follow up plan: Telephone appointment with care management team member scheduled for:12/16/2019.  [place], [place] [address] Direct Dial: [phone] [email or web address]: [email or web address]

## 2019-06-03 ENCOUNTER — Telehealth (INDEPENDENT_AMBULATORY_CARE_PROVIDER_SITE_OTHER): Payer: Medicare Other | Admitting: Family Medicine

## 2019-06-03 ENCOUNTER — Encounter: Payer: Self-pay | Admitting: Family Medicine

## 2019-06-03 DIAGNOSIS — R197 Diarrhea, unspecified: Secondary | ICD-10-CM | POA: Diagnosis not present

## 2019-06-03 MED ORDER — DICYCLOMINE HCL 10 MG PO CAPS: ORAL_CAPSULE | ORAL | 2 refills | Status: DC

## 2019-06-03 NOTE — Progress Notes (Signed)
Subjective:    Patient ID: Laura James, female    DOB: [birth year], 69 y.o.   MRN: [id number]   HPI: Laura James is a 69 y.o. female presenting for stomach issues - cramping is severe. Then she gets clammy sweats followed by diarrhea. No nausea. Feels gas and bloating. No blood in stool. Onset a year ago. Was infrequent. Now getting more frequent and severe.Occurring 1-2 times a week. Goes back to normal after a couple of hours from the diarrheal movement. Has normal BMs between episodes   Depression screen [facility] 2/9 04/07/2019 03/24/2019 12/11/2018 11/05/2018 08/08/2018  Decreased Interest 0 0 0 0 0  Down, Depressed, Hopeless 0 0 0 0 0  PHQ - 2 Score 0 0 0 0 0     Relevant past medical, surgical, family and social history reviewed and updated as indicated.  Interim medical history since our last visit reviewed. Allergies and medications reviewed and updated.  ROS:  Review of Systems  Constitutional: Negative.   HENT: Negative.   Eyes: Negative for visual disturbance.  Respiratory: Negative for shortness of breath.   Cardiovascular: Negative for chest pain and leg swelling.  Gastrointestinal: Positive for abdominal pain.  Musculoskeletal: Negative for arthralgias.  Neurological: Negative for dizziness and headaches.     Social History   Tobacco Use  Smoking Status Former Smoker  . Packs/day: 0.50  . Years: 5.00  . Pack years: 2.50  . Types: Cigarettes  . Quit date: 01/24/1968  . Years since quitting: 51.3  Smokeless Tobacco Never Used       Objective:     Wt Readings from Last 3 Encounters:  04/07/19 184 lb (83.5 kg)  03/24/19 186 lb (84.4 kg)  11/05/18 187 lb (84.8 kg)     Exam deferred. Pt. Harboring due to COVID 19. Video visit performed.   Assessment & Plan:   1. Diarrhea of presumed infectious origin     Meds ordered this encounter  Medications  . dicyclomine (BENTYL) 10 MG capsule    Sig: Take one or two as needed at onset of abdominal cramping. Do  not exceed 4 per day    Dispense:  90 capsule    Refill:  2    Orders Placed This Encounter  Procedures  . Cdiff NAA+O+P+Stool Culture  . Giardia/Cryptosporidium EIA      Diagnoses and all orders for this visit:  Diarrhea of presumed infectious origin -     Cdiff NAA+O+P+Stool Culture -     Giardia/Cryptosporidium EIA  Other orders -     dicyclomine (BENTYL) 10 MG capsule; Take one or two as needed at onset of abdominal cramping. Do not exceed 4 per day    Virtual Visit via telephone Note  I discussed the limitations, risks, security and privacy concerns of performing an evaluation and management service by telephone and the availability of in person appointments. The patient was identified with two identifiers. Pt.expressed understanding and agreed to proceed. Pt. Is at home. Dr. Livia Snellen is in his office.  Follow Up Instructions:   I discussed the assessment and treatment plan with the patient. The patient was provided an opportunity to ask questions and all were answered. The patient agreed with the plan and demonstrated an understanding of the instructions.   The patient was advised to call back or seek an in-person evaluation if the symptoms worsen or if the condition fails to improve as anticipated.   Total minutes including chart review and phone contact time:  21   Follow up plan: Return if symptoms worsen or fail to improve.  Claretta Fraise, MD [facility]

## 2019-06-09 DIAGNOSIS — R197 Diarrhea, unspecified: Secondary | ICD-10-CM | POA: Diagnosis not present

## 2019-06-10 ENCOUNTER — Other Ambulatory Visit: Payer: Self-pay

## 2019-06-10 ENCOUNTER — Other Ambulatory Visit: Payer: Medicare Other

## 2019-06-10 DIAGNOSIS — R197 Diarrhea, unspecified: Secondary | ICD-10-CM | POA: Diagnosis not present

## 2019-06-12 LAB — GIARDIA/CRYPTOSPORIDIUM EIA
Cryptosporidium EIA: NEGATIVE
Giardia Ag, Stl: NEGATIVE

## 2019-06-14 LAB — CDIFF NAA+O+P+STOOL CULTURE
E coli, Shiga toxin Assay: NEGATIVE
Toxigenic C. Difficile by PCR: NEGATIVE

## 2019-06-17 ENCOUNTER — Other Ambulatory Visit: Payer: Self-pay | Admitting: *Deleted

## 2019-06-17 MED ORDER — ROPINIROLE HCL 0.25 MG PO TABS: 0.2500 mg | ORAL_TABLET | Freq: Every day | ORAL | 2 refills | Status: DC

## 2019-06-17 NOTE — Telephone Encounter (Signed)
Prescription sent to pharmacy.

## 2019-06-17 NOTE — Addendum Note (Signed)
Addended by: Evelina Dun A on: 06/17/2019 01:41 PM   Modules accepted: Orders

## 2019-06-17 NOTE — Telephone Encounter (Signed)
Fax from [facility] request Ropinirole HCL 0.25 1 QHS Not on current med list LOV 04/07/19 Next OV not sched

## 2019-07-07 ENCOUNTER — Other Ambulatory Visit: Payer: Self-pay | Admitting: Family

## 2019-07-07 DIAGNOSIS — M5441 Lumbago with sciatica, right side: Secondary | ICD-10-CM

## 2019-07-07 DIAGNOSIS — M7071 Other bursitis of hip, right hip: Secondary | ICD-10-CM

## 2019-08-12 DIAGNOSIS — H40013 Open angle with borderline findings, low risk, bilateral: Secondary | ICD-10-CM | POA: Diagnosis not present

## 2019-08-12 DIAGNOSIS — H25812 Combined forms of age-related cataract, left eye: Secondary | ICD-10-CM | POA: Diagnosis not present

## 2019-08-12 DIAGNOSIS — H43813 Vitreous degeneration, bilateral: Secondary | ICD-10-CM | POA: Diagnosis not present

## 2019-08-12 DIAGNOSIS — H04123 Dry eye syndrome of bilateral lacrimal glands: Secondary | ICD-10-CM | POA: Diagnosis not present

## 2019-08-12 DIAGNOSIS — Z961 Presence of intraocular lens: Secondary | ICD-10-CM | POA: Diagnosis not present

## 2019-09-22 DIAGNOSIS — Z1159 Encounter for screening for other viral diseases: Secondary | ICD-10-CM | POA: Diagnosis not present

## 2019-09-30 ENCOUNTER — Other Ambulatory Visit: Payer: Self-pay | Admitting: Family

## 2019-10-25 ENCOUNTER — Other Ambulatory Visit: Payer: Self-pay | Admitting: Family

## 2019-10-27 NOTE — Telephone Encounter (Signed)
[facility]. NTBS 30 days given 09/30/19

## 2019-10-29 NOTE — Telephone Encounter (Signed)
Patient aware.

## 2019-11-07 ENCOUNTER — Other Ambulatory Visit: Payer: Self-pay | Admitting: Family

## 2019-11-07 DIAGNOSIS — M7071 Other bursitis of hip, right hip: Secondary | ICD-10-CM

## 2019-11-07 DIAGNOSIS — M5441 Lumbago with sciatica, right side: Secondary | ICD-10-CM

## 2019-11-07 DIAGNOSIS — G8929 Other chronic pain: Secondary | ICD-10-CM

## 2019-11-11 ENCOUNTER — Other Ambulatory Visit: Payer: Self-pay | Admitting: Family

## 2019-11-11 DIAGNOSIS — G8929 Other chronic pain: Secondary | ICD-10-CM

## 2019-11-11 DIAGNOSIS — M7071 Other bursitis of hip, right hip: Secondary | ICD-10-CM

## 2019-11-24 ENCOUNTER — Other Ambulatory Visit: Payer: Self-pay | Admitting: Family

## 2019-11-24 DIAGNOSIS — Z1231 Encounter for screening mammogram for malignant neoplasm of breast: Secondary | ICD-10-CM

## 2019-12-09 ENCOUNTER — Ambulatory Visit: Payer: Medicare Other | Admitting: Family

## 2019-12-11 ENCOUNTER — Ambulatory Visit (INDEPENDENT_AMBULATORY_CARE_PROVIDER_SITE_OTHER): Payer: Medicare Other

## 2019-12-11 ENCOUNTER — Other Ambulatory Visit: Payer: Self-pay

## 2019-12-11 ENCOUNTER — Ambulatory Visit (INDEPENDENT_AMBULATORY_CARE_PROVIDER_SITE_OTHER): Payer: Medicare Other | Admitting: Family

## 2019-12-11 ENCOUNTER — Encounter: Payer: Self-pay | Admitting: Family

## 2019-12-11 VITALS — BP 114/73 | HR 60 | Temp 97.5°F | Ht 64.0 in | Wt 186.6 lb

## 2019-12-11 DIAGNOSIS — Z23 Encounter for immunization: Secondary | ICD-10-CM | POA: Diagnosis not present

## 2019-12-11 DIAGNOSIS — M7071 Other bursitis of hip, right hip: Secondary | ICD-10-CM | POA: Diagnosis not present

## 2019-12-11 DIAGNOSIS — G4733 Obstructive sleep apnea (adult) (pediatric): Secondary | ICD-10-CM

## 2019-12-11 DIAGNOSIS — M85852 Other specified disorders of bone density and structure, left thigh: Secondary | ICD-10-CM | POA: Diagnosis not present

## 2019-12-11 DIAGNOSIS — D508 Other iron deficiency anemias: Secondary | ICD-10-CM | POA: Diagnosis not present

## 2019-12-11 DIAGNOSIS — M159 Polyosteoarthritis, unspecified: Secondary | ICD-10-CM

## 2019-12-11 DIAGNOSIS — M150 Primary generalized (osteo)arthritis: Secondary | ICD-10-CM

## 2019-12-11 DIAGNOSIS — M8949 Other hypertrophic osteoarthropathy, multiple sites: Secondary | ICD-10-CM

## 2019-12-11 DIAGNOSIS — Z780 Asymptomatic menopausal state: Secondary | ICD-10-CM

## 2019-12-11 DIAGNOSIS — M5441 Lumbago with sciatica, right side: Secondary | ICD-10-CM

## 2019-12-11 DIAGNOSIS — M25551 Pain in right hip: Secondary | ICD-10-CM | POA: Diagnosis not present

## 2019-12-11 DIAGNOSIS — I10 Essential (primary) hypertension: Secondary | ICD-10-CM

## 2019-12-11 DIAGNOSIS — G8929 Other chronic pain: Secondary | ICD-10-CM

## 2019-12-11 DIAGNOSIS — E785 Hyperlipidemia, unspecified: Secondary | ICD-10-CM

## 2019-12-11 DIAGNOSIS — K219 Gastro-esophageal reflux disease without esophagitis: Secondary | ICD-10-CM

## 2019-12-11 MED ORDER — ATENOLOL 25 MG PO TABS: 12.5000 mg | ORAL_TABLET | Freq: Every day | ORAL | 5 refills | Status: DC

## 2019-12-11 MED ORDER — IRON (FERROUS SULFATE) 325 (65 FE) MG PO TABS: 325.0000 mg | ORAL_TABLET | Freq: Two times a day (BID) | ORAL | 5 refills | Status: DC

## 2019-12-11 MED ORDER — ATORVASTATIN CALCIUM 10 MG PO TABS: 10.0000 mg | ORAL_TABLET | Freq: Every day | ORAL | 1 refills | Status: DC

## 2019-12-11 MED ORDER — DICLOFENAC SODIUM 75 MG PO TBEC: 75.0000 mg | DELAYED_RELEASE_TABLET | Freq: Two times a day (BID) | ORAL | 3 refills | Status: DC

## 2019-12-11 MED ORDER — FLUTICASONE PROPIONATE 50 MCG/ACT NA SUSP: 2.0000 | Freq: Every day | NASAL | 6 refills | Status: DC

## 2019-12-11 MED ORDER — ALBUTEROL SULFATE HFA 108 (90 BASE) MCG/ACT IN AERS: INHALATION_SPRAY | RESPIRATORY_TRACT | 2 refills | Status: DC

## 2019-12-11 MED ORDER — HYDROCHLOROTHIAZIDE 25 MG PO TABS: 25.0000 mg | ORAL_TABLET | Freq: Every day | ORAL | 1 refills | Status: DC

## 2019-12-11 MED ORDER — PREDNISONE 10 MG (21) PO TBPK: ORAL_TABLET | ORAL | 0 refills | Status: DC

## 2019-12-11 NOTE — Patient Instructions (Signed)
Hip Bursitis  Hip bursitis is inflammation of a fluid-filled sac (bursa) in the hip joint. The bursa prevents the bones in the hip joint from rubbing against each other. Hip bursitis can cause mild to moderate pain, and symptoms often come and go over time. What are the causes? This condition may be caused by:  Injury to the hip.  Overuse of the muscles that surround the hip joint.  Previous injury or surgery of the hip.  Arthritis or gout.  Diabetes.  Thyroid disease.  Infection. In some cases, the cause may not be known. What are the signs or symptoms? Symptoms of this condition include:  Mild or moderate pain in the hip area. Pain may get worse with movement.  Tenderness and swelling of the hip, especially on the outer side of the hip.  In rare cases, the bursa may become infected. This may cause a fever, as well as warmth and redness in the area. Symptoms may come and go. How is this diagnosed? This condition may be diagnosed based on:  A physical exam.  Your medical history.  X-rays.  Removal of fluid from your inflamed bursa for testing (biopsy). You may be sent to a health care provider who specializes in bone diseases (orthopedist) or a provider who specializes in joint inflammation (rheumatologist). How is this treated? This condition is treated by resting, icing, applying pressure (compression), and raising (elevating) the injured area. This is called RICE treatment. In some cases, this may be enough to make your symptoms go away. Treatment may also include:  Using crutches.  Draining fluid out of the bursa to help relieve swelling.  Injecting medicine that helps to reduce inflammation (cortisone).  Additional medicines if the bursa is infected. Follow these instructions at home: Managing pain, stiffness, and swelling   If directed, put ice on the painful area. ? Put ice in a plastic bag. ? Place a towel between your skin and the bag. ? Leave the ice  on for 20 minutes, 2-3 times a day. ? Raise (elevate) your hip above the level of your heart as much as you can without pain. To do this, try putting a pillow under your hips while you lie down. Activity  Return to your normal activities as told by your health care provider. Ask your health care provider what activities are safe for you.  Rest and protect your hip as much as possible until your pain and swelling get better. General instructions  Take over-the-counter and prescription medicines only as told by your health care provider.  Wear compression wraps only as told by your health care provider.  Do not use your hip to support your body weight until your health care provider says that you can. Use crutches as told by your health care provider.  Gently massage and stretch your injured area as often as is comfortable.  Keep all follow-up visits as told by your health care provider. This is important. How is this prevented?  Exercise regularly, as told by your health care provider.  Warm up and stretch before being active.  Cool down and stretch after being active.  If an activity irritates your hip or causes pain, avoid the activity as much as possible.  Avoid sitting down for long periods at a time. Contact a health care provider if you:  Have a fever.  Develop new symptoms.  Have difficulty walking or doing everyday activities.  Have pain that gets worse or does not get better with medicine.  Develop red skin or a feeling of warmth in your hip area. Get help right away if you:  Cannot move your hip.  Have severe pain. Summary  Hip bursitis is inflammation of a fluid-filled sac (bursa) in the hip joint.  Hip bursitis can cause mild to moderate pain, and symptoms often come and go over time.  This condition is treated with rest, ice, compression, elevation, and medicines. This information is not intended to replace advice given to you by your health care  provider. Make sure you discuss any questions you have with your health care provider. Document Revised: 09/17/2017 Document Reviewed: 09/17/2017 Elsevier Patient Education  [place].

## 2019-12-11 NOTE — Progress Notes (Signed)
Subjective:    Patient ID: Laura James, female    DOB: 03-12-1950, 69 y.o.   MRN: [id number]  Chief Complaint  Patient presents with  . Medical Management of Chronic Issues  . Hypertension   Pt presents to the office today for chronic follow up.  Hypertension This is a chronic problem. The current episode started more than 1 year ago. The problem has been resolved since onset. The problem is controlled. Associated symptoms include peripheral edema (Comes and goes). Pertinent negatives include no malaise/fatigue or shortness of breath. Risk factors for coronary artery disease include dyslipidemia, obesity and sedentary lifestyle. The current treatment provides moderate improvement.  Gastroesophageal Reflux She complains of belching and heartburn. This is a chronic problem. The current episode started more than 1 year ago. The problem has been waxing and waning. She has tried a PPI for the symptoms. The treatment provided moderate relief.  Arthritis Presents for follow-up visit. She complains of pain and stiffness. The symptoms have been stable. Affected locations include the right hip (back). Her pain is at a severity of 7/10.  Hyperlipidemia This is a chronic problem. The current episode started more than 1 year ago. The problem is controlled. Recent lipid tests were reviewed and are normal. Pertinent negatives include no shortness of breath.  Anemia Presents for follow-up visit. There has been no bruising/bleeding easily, confusion or malaise/fatigue.  OSA Uses some nights. Stable.    Review of Systems  Constitutional: Negative for malaise/fatigue.  Respiratory: Negative for shortness of breath.   Gastrointestinal: Positive for heartburn.  Musculoskeletal: Positive for arthritis and stiffness.  Hematological: Does not bruise/bleed easily.  Psychiatric/Behavioral: Negative for confusion.  All other systems reviewed and are negative.      Objective:   Physical Exam Vitals  reviewed.  Constitutional:      General: She is not in acute distress.    Appearance: She is well-developed.  HENT:     Head: Normocephalic and atraumatic.     Right Ear: Tympanic membrane normal.     Left Ear: Tympanic membrane normal.  Eyes:     Pupils: Pupils are equal, round, and reactive to light.  Neck:     Thyroid: No thyromegaly.  Cardiovascular:     Rate and Rhythm: Normal rate and regular rhythm.     Heart sounds: Normal heart sounds. No murmur heard.   Pulmonary:     Effort: Pulmonary effort is normal. No respiratory distress.     Breath sounds: Normal breath sounds. No wheezing.  Abdominal:     General: Bowel sounds are normal. There is no distension.     Palpations: Abdomen is soft.     Tenderness: There is no abdominal tenderness.  Musculoskeletal:        General: Tenderness (right lateral tenderness with palpation ) present.     Cervical back: Normal range of motion and neck supple.     Comments: Pain in lumbar with flexion   Skin:    General: Skin is warm and dry.  Neurological:     Mental Status: She is alert and oriented to person, place, and time.     Cranial Nerves: No cranial nerve deficit.     Deep Tendon Reflexes: Reflexes are normal and symmetric.  Psychiatric:        Behavior: Behavior normal.        Thought Content: Thought content normal.        Judgment: Judgment normal.       BP 114/73  Pulse 60   Temp (!) 97.5 F (36.4 C) (Temporal)   Ht _0  (1.626 m)   Wt 186 lb 9.6 oz (84.6 kg)   SpO2 97%   BMI 32.03 kg/m      Assessment & Plan:  Zhania Shaheen Mimbs comes in today with chief complaint of Medical Management of Chronic Issues and Hypertension   Diagnosis and orders addressed:  1. Chronic right-sided low back pain with right-sided sciatica - diclofenac (VOLTAREN) 75 MG EC tablet; Take 1 tablet (75 mg total) by mouth 2 (two) times daily.  Dispense: 60 tablet; Refill: 3 - CMP14+EGFR - CBC with Differential/Platelet  2. Bursitis of  right hip, unspecified bursa Rest ROM exercises  - diclofenac (VOLTAREN) 75 MG EC tablet; Take 1 tablet (75 mg total) by mouth 2 (two) times daily.  Dispense: 60 tablet; Refill: 3 - CMP14+EGFR - CBC with Differential/Platelet - DG HIP UNILAT W OR W/O PELVIS 2-3 VIEWS RIGHT; Future - predniSONE (STERAPRED UNI-PAK 21 TAB) 10 MG (21) TBPK tablet; Use as directed  Dispense: 21 tablet; Refill: 0 - Ambulatory referral to Orthopedic Surgery  3. Essential hypertension, benign - atenolol (TENORMIN) 25 MG tablet; Take 0.5 tablets (12.5 mg total) by mouth daily. (Needs to be seen before next refill)  Dispense: 30 tablet; Refill: 5 - hydrochlorothiazide (HYDRODIURIL) 25 MG tablet; Take 1 tablet (25 mg total) by mouth daily. (Needs to be seen before next refill)  Dispense: 90 tablet; Refill: 1 - CMP14+EGFR - CBC with Differential/Platelet  4. Other iron deficiency anemia - Iron, Ferrous Sulfate, 325 (65 Fe) MG TABS; Take 325 mg by mouth 2 (two) times daily with a meal.  Dispense: 180 tablet; Refill: 5 - CMP14+EGFR - CBC with Differential/Platelet  5. Gastroesophageal reflux disease without esophagitis - CMP14+EGFR - CBC with Differential/Platelet  6. Primary osteoarthritis involving multiple joints - CMP14+EGFR - CBC with Differential/Platelet - predniSONE (STERAPRED UNI-PAK 21 TAB) 10 MG (21) TBPK tablet; Use as directed  Dispense: 21 tablet; Refill: 0 - Ambulatory referral to Orthopedic Surgery  7. Hyperlipidemia, unspecified hyperlipidemia type - atorvastatin (LIPITOR) 10 MG tablet; Take 1 tablet (10 mg total) by mouth daily. (Needs to be seen before next refill)  Dispense: 90 tablet; Refill: 1 - CMP14+EGFR - CBC with Differential/Platelet - Lipid panel  8. Obstructive sleep apnea - CMP14+EGFR - CBC with Differential/Platelet  9. Post-menopausal - DG [facility] DEXA   Labs pending Health Maintenance reviewed Diet and exercise encouraged  Follow up plan: 6 months    Evelina Dun,  FNP

## 2019-12-12 LAB — CMP14+EGFR
ALT: 21 IU/L (ref 0–32)
AST: 25 IU/L (ref 0–40)
Albumin/Globulin Ratio: 1.6 (ref 1.2–2.2)
Albumin: 4.1 g/dL (ref 3.7–4.7)
Alkaline Phosphatase: 83 IU/L (ref 44–121)
BUN/Creatinine Ratio: 13 (ref 12–28)
BUN: 12 mg/dL (ref 8–27)
Bilirubin Total: 0.3 mg/dL (ref 0.0–1.2)
CO2: 25 mmol/L (ref 20–29)
Calcium: 10 mg/dL (ref 8.7–10.3)
Chloride: 100 mmol/L (ref 96–106)
Creatinine, Ser: 0.94 mg/dL (ref 0.57–1.00)
GFR calc Af Amer: 71 mL/min/{1.73_m2} (ref >59)
GFR calc non Af Amer: 61 mL/min/{1.73_m2} (ref >59)
Globulin, Total: 2.5 g/dL (ref 1.5–4.5)
Glucose: 80 mg/dL (ref 65–99)
Potassium: 4.2 mmol/L (ref 3.5–5.2)
Sodium: 141 mmol/L (ref 134–144)
Total Protein: 6.6 g/dL (ref 6.0–8.5)

## 2019-12-12 LAB — LIPID PANEL
Chol/HDL Ratio: 3.3 ratio (ref 0.0–4.4)
Cholesterol, Total: 144 mg/dL (ref 100–199)
HDL: 44 mg/dL (ref >39)
LDL Chol Calc (NIH): 82 mg/dL (ref 0–99)
Triglycerides: 98 mg/dL (ref 0–149)
VLDL Cholesterol Cal: 18 mg/dL (ref 5–40)

## 2019-12-12 LAB — CBC WITH DIFFERENTIAL/PLATELET
Basophils Absolute: 0 10*3/uL (ref 0.0–0.2)
Basos: 1 %
EOS (ABSOLUTE): 0.6 10*3/uL — ABNORMAL HIGH (ref 0.0–0.4)
Eos: 7 %
Hematocrit: 42.7 % (ref 34.0–46.6)
Hemoglobin: 13.7 g/dL (ref 11.1–15.9)
Immature Grans (Abs): 0 10*3/uL (ref 0.0–0.1)
Immature Granulocytes: 0 %
Lymphocytes Absolute: 2.6 10*3/uL (ref 0.7–3.1)
Lymphs: 31 %
MCH: 28.2 pg (ref 26.6–33.0)
MCHC: 32.1 g/dL (ref 31.5–35.7)
MCV: 88 fL (ref 79–97)
Monocytes Absolute: 0.5 10*3/uL (ref 0.1–0.9)
Monocytes: 6 %
Neutrophils Absolute: 4.7 10*3/uL (ref 1.4–7.0)
Neutrophils: 55 %
Platelets: 319 10*3/uL (ref 150–450)
RBC: 4.86 x10E6/uL (ref 3.77–5.28)
RDW: 13.1 % (ref 11.7–15.4)
WBC: 8.4 10*3/uL (ref 3.4–10.8)

## 2019-12-16 ENCOUNTER — Ambulatory Visit: Payer: Medicare Other | Admitting: *Deleted

## 2019-12-16 DIAGNOSIS — E785 Hyperlipidemia, unspecified: Secondary | ICD-10-CM

## 2019-12-16 DIAGNOSIS — I10 Essential (primary) hypertension: Secondary | ICD-10-CM

## 2019-12-16 DIAGNOSIS — M7071 Other bursitis of hip, right hip: Secondary | ICD-10-CM

## 2019-12-16 NOTE — Chronic Care Management (AMB) (Signed)
  Chronic Care Management   Initial Visit Note  12/16/2019 Name: Laura James MRN: [id number] DOB: 1950/08/03  Referred by: Sharion Balloon, FNP Reason for referral : Chronic Care Management (Initial Visit)   Laura James is a 69 y.o. year old female who is a primary care patient of Sharion Balloon, FNP. The CCM team was consulted for assistance with chronic disease management and care coordination needs related to HTN, HLD and hypothyroidism.  Review of patient status, including review of consultants reports, relevant laboratory and other test results was performed prior to outreach.   I spoke with Laura James by telephone today regarding management of their chronic medical conditions. They do not have any CCM or resource needs and feel that their chronic medical conditions are well managed. They appreciated the outreach, but do not feel that CCM services are needed at this time. They will reach out in the future if a need arises. She does continue to have hip and back pain. Reviewed referral to [facility] in [place] and provided patient with their contact number so that she can reach back out to them to schedule a visit. She was very physically active prior to hip/back pain and would like to get back to that point again soon.   SDOH (Social Determinants of Health) assessments performed: Yes See Care Plan activities for detailed interventions related to SDOH     Plan:  . The patient has been provided with contact information for the care management team and has been advised to call if they would like to re-enroll in the CCM program to receive care management and care coordination services.  . CCM enrollment status changed to "previously enrolled" as per patient request on 12/16/2019  to discontinue enrollment. Case closed to case management services in primary care home.  . Patient was provided with general disease management education and encouraged to follow-up with their PCP and  specialists as recommended.  . Patient will reach out to [facility] at [phone] to schedule appointment . Patient will f/u with PCP as needed  Chong Sicilian, BSN, RN-BC [place] [facility] [place] [facility] Direct Dial: [phone]

## 2019-12-16 NOTE — Patient Instructions (Signed)
  Plan:  . The patient has been provided with contact information for the care management team and has been advised to call if they would like to re-enroll in the CCM program to receive care management and care coordination services.  . CCM enrollment status changed to "previously enrolled" as per patient request on 12/16/2019  to discontinue enrollment. Case closed to case management services in primary care home.  . Patient was provided with general disease management education and encouraged to follow-up with their PCP and specialists as recommended.  . Patient will reach out to [facility] at [phone] to schedule appointment . Patient will f/u with PCP as needed  Chong Sicilian, BSN, RN-BC [place] [facility] [place] [facility] Direct Dial: [phone]

## 2019-12-17 ENCOUNTER — Ambulatory Visit (INDEPENDENT_AMBULATORY_CARE_PROVIDER_SITE_OTHER): Payer: Medicare Other | Admitting: *Deleted

## 2019-12-17 DIAGNOSIS — Z0000 Encounter for general adult medical examination without abnormal findings: Secondary | ICD-10-CM | POA: Diagnosis not present

## 2019-12-17 NOTE — Patient Instructions (Signed)
  [place] Maintenance Summary and Written Plan of Care  [name] ,  Thank you for allowing me to perform your Medicare Annual Wellness Visit and for your ongoing commitment to your health.   Health Maintenance & Immunization History Health Maintenance  Topic Date Due  . COLONOSCOPY  11/29/2020  . MAMMOGRAM  12/16/2020  . DEXA SCAN  12/10/2021  . TETANUS/TDAP  12/24/2023  . INFLUENZA VACCINE  Completed  . COVID-19 Vaccine  Completed  . Hepatitis C Screening  Completed  . PNA vac Low Risk Adult  Completed   Immunization History  Administered Date(s) Administered  . Fluad Quad(high Dose 65+) 11/05/2018, 12/11/2019  . Influenza, High Dose Seasonal PF 01/19/2016, 11/24/2016  . Influenza-Unspecified 11/23/2013  . Moderna SARS-COVID-2 Vaccination 03/06/2019, 04/04/2019, 11/27/2019  . Pneumococcal Conjugate-13 01/19/2016  . Pneumococcal Polysaccharide-23 11/01/2017  . Tdap 12/23/2013  . Zoster 06/28/2015    These are the patient goals that we discussed: Goals Addressed            This Visit's Progress   . Increase physical activity       Pt has not maintained last year' s goal. She would like to do the same goal again and will start right away.   12/17/2019 AWV Goal: Exercise for General Health   Patient will verbalize understanding of the benefits of increased physical activity:  Exercising regularly is important. It will improve your overall fitness, flexibility, and endurance.  Regular exercise also will improve your overall health. It can help you control your weight, reduce stress, and improve your bone density.  Over the next year, patient will increase physical activity as tolerated with a goal of at least 150 minutes of moderate physical activity per week.   You can tell that you are exercising at a moderate intensity if your heart starts beating faster and you start breathing faster but can still hold a  conversation.  Moderate-intensity exercise ideas include:  Walking 1 mile (1.6 km) in about 15 minutes  Biking  Hiking  Golfing  Dancing  Water aerobics  Patient will verbalize understanding of everyday activities that increase physical activity by providing examples like the following: ? Yard work, such as: ? Pushing a Conservation officer, nature ? Raking and bagging leaves ? Washing your car ? Pushing a stroller ? Shoveling snow ? Gardening ? Washing windows or floors  Patient will be able to explain general safety guidelines for exercising:   Before you start a new exercise program, talk with your health care provider.  Do not exercise so much that you hurt yourself, feel dizzy, or get very short of breath.  Wear comfortable clothes and wear shoes with good support.  Drink plenty of water while you exercise to prevent dehydration or heat stroke.  Work out until your breathing and your heartbeat get faster.         This is a list of Health Maintenance Items that are overdue or due now: There are no preventive care reminders to display for this patient.   Orders/Referrals Placed Today: No orders of the defined types were placed in this encounter.  (Contact our referral department at [phone] if you have not spoken with someone about your referral appointment within the next 5 days)    Follow-up Plan Follow up with Laura Dun, FNP as scheduled.

## 2019-12-17 NOTE — Progress Notes (Signed)
MEDICARE ANNUAL WELLNESS VISIT  12/17/2019  Telephone Visit Disclaimer This Medicare AWV was conducted by telephone due to national recommendations for restrictions regarding the COVID-19 Pandemic (e.g. social distancing).  I verified, using two identifiers, that I am speaking with Laura James or their authorized healthcare agent. I discussed the limitations, risks, security, and privacy concerns of performing an evaluation and management service by telephone and the potential availability of an in-person appointment in the future. The patient expressed understanding and agreed to proceed.  Location of Patient: Home Location of Provider (nurse):  Office  Subjective:    Laura James is a 69 y.o. female patient of Hawks, Theador Hawthorne, FNP who had a Medicare Annual Wellness Visit today via telephone. Nupur is Retired and lives with their spouse. she has 3 children. she reports that she is socially active and does interact with friends/family regularly. she is not physically active and enjoys reading, teaching and cooking.  Patient Care Team: Sharion Balloon, FNP as PCP - General (Family Medicine)  Advanced Directives 12/17/2019 12/11/2018 11/01/2017 08/02/2016 12/27/2015 04/02/2014  Does Patient Have a Medical Advance Directive? Yes No No No No No  Type of Paramedic of [place];Living will - - - - -  Does patient want to make changes to medical advance directive? No - Patient declined - - - - -  Copy of [place] in Chart? No - copy requested - - - - -  Would patient like information on creating a medical advance directive? - Yes (MAU/Ambulatory/Procedural Areas - Information given);Yes (ED - Information included in AVS) No - Patient declined Yes (ED - Information included in AVS) - [facility] Utilization Over the Past 12 Months: # of hospitalizations or ER visits: 0 # of surgeries: 0  Review of Systems    Patient reports that her overall  health is unchanged compared to last year.  History obtained from chart review and the patient General ROS: negative  Patient Reported Readings (BP, Pulse, CBG, Weight, etc) none  Pain Assessment Pain : No/denies pain     Current Medications & Allergies (verified) Allergies as of 12/17/2019      Reactions   Adhesive [tape] Itching, Rash   Latex Rash      Medication List       Accurate as of December 17, 2019  8:46 AM. If you have any questions, ask your nurse or doctor.        albuterol 108 (90 Base) MCG/ACT inhaler Commonly known as: VENTOLIN HFA TAKE 2 PUFFS BY MOUTH EVERY 6 HOURS AS NEEDED FOR WHEEZE OR SHORTNESS OF BREATH   aspirin 81 MG tablet Take 81 mg by mouth daily.   atenolol 25 MG tablet Commonly known as: TENORMIN Take 0.5 tablets (12.5 mg total) by mouth daily. (Needs to be seen before next refill)   atorvastatin 10 MG tablet Commonly known as: LIPITOR Take 1 tablet (10 mg total) by mouth daily. (Needs to be seen before next refill)   B-12 3000 MCG Caps Take 1 capsule by mouth daily.   cetirizine 10 MG tablet Commonly known as: ZYRTEC Take 10 mg by mouth daily as needed.   cholecalciferol 1000 units tablet Commonly known as: VITAMIN D Take 1 tablet (1,000 Units total) by mouth daily.   diclofenac 75 MG EC tablet Commonly known as: VOLTAREN Take 1 tablet (75 mg total) by mouth 2 (two) times daily.   docusate sodium 100 MG  capsule Commonly known as: COLACE Take 100 mg by mouth daily as needed for mild constipation.   fluticasone 50 MCG/ACT nasal spray Commonly known as: FLONASE Place 2 sprays into both nostrils daily.   hydrochlorothiazide 25 MG tablet Commonly known as: HYDRODIURIL Take 1 tablet (25 mg total) by mouth daily. (Needs to be seen before next refill)   Iron (Ferrous Sulfate) 325 (65 Fe) MG Tabs Take 325 mg by mouth 2 (two) times daily with a meal.   magnesium oxide 400 MG tablet Commonly known as: MAG-OX Take 400 mg by  mouth daily.   meclizine 25 MG tablet Commonly known as: ANTIVERT Take 1 tablet (25 mg total) by mouth 3 (three) times daily as needed for dizziness.   predniSONE 10 MG (21) Tbpk tablet Commonly known as: STERAPRED UNI-PAK 21 TAB Use as directed       History (reviewed): Past Medical History:  Diagnosis Date  . Barrett esophagus   . 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 Depression   . Diverticulosis of colon (without mention of hemorrhage)   . Esophageal reflux   . Essential hypertension, benign   . Gastritis   . Grave's disease   . Hiatal hernia   . Hypothyroidism   . IBS (irritable bowel syndrome)   . Obesity   . Osteopenia   . Sleep apnea   . Symptomatic menopausal or female climacteric states    Past Surgical History:  Procedure Laterality Date  . ABDOMINAL HYSTERECTOMY N/A   . CHOLECYSTECTOMY    . INCONTINENCE SURGERY    . TONSILLECTOMY     Family History  Problem Relation Age of Onset  . Lung cancer Father   . Hypertension Mother   . Arthritis Mother   . Osteoporosis Mother   . Kidney disease Mother        related to APAP use  . Diabetes Other   . Clotting disorder Other   . Breast cancer Other        aunt  . Hypertension Brother   . Hypertension Daughter   . Diabetes Son   . Diabetes Daughter   . Colon cancer Neg Hx    Social History   Socioeconomic History  . Marital status: Married    Spouse name: Francee Piccolo  . Number of children: 3  . Years of education: 73  . Highest education level: Master's degree (e.g., MA, MS, MEng, MEd, MSW, MBA)  Occupational History  . Occupation: Hospice- Chaplain    Comment: [employer]  . Occupation: retired  Tobacco Use  . Smoking status: Former Smoker    Packs/day: 0.50    Years: 5.00    Pack years: 2.50    Types: Cigarettes    Quit date: 01/24/1968    Years since quitting: 51.9  . Smokeless tobacco: Never Used  Vaping Use  . Vaping Use: Never used    Substance and Sexual Activity  . Alcohol use: No    Alcohol/week: 0.0 standard drinks  . Drug use: No  . Sexual activity: Yes  Other Topics Concern  . Not on file  Social History Narrative   Lives at home with husband    Social Determinants of Health   Financial Resource Strain: Low Risk   . Difficulty of Paying Living Expenses: Not hard at all  Food Insecurity: No Food Insecurity  . Worried About Charity fundraiser in the Last Year: Never true  . Ran Out of Food  in the Last Year: Never true  Transportation Needs: No Transportation Needs  . Lack of Transportation (Medical): No  . Lack of Transportation (Non-Medical): No  Physical Activity: Inactive  . Days of Exercise per Week: 0 days  . Minutes of Exercise per Session: 0 min  Stress: No Stress Concern Present  . Feeling of Stress : Not at all  Social Connections: Socially Integrated  . Frequency of Communication with Friends and Family: More than three times a week  . Frequency of Social Gatherings with Friends and Family: More than three times a week  . Attends Religious Services: More than 4 times per year  . Active Member of Clubs or Organizations: Yes  . Attends Archivist Meetings: More than 4 times per year  . Marital Status: Married    Activities of Daily Living In your present state of health, do you have any difficulty performing the following activities: 12/17/2019  Hearing? N  Vision? N  Difficulty concentrating or making decisions? N  Walking or climbing stairs? Y  Comment Currently has bursitis  Dressing or bathing? N  Doing errands, shopping? N  Preparing Food and eating ? N  Using the Toilet? N  In the past six months, have you accidently leaked urine? N  Do you have problems with loss of bowel control? N  Managing your Medications? N  Managing your Finances? N  Housekeeping or managing your Housekeeping? N  Some recent data might be hidden    Patient Education/ Literacy How often do  you need to have someone help you when you read instructions, pamphlets, or other written materials from your doctor or pharmacy?: 1 - Never What is the last grade level you completed in school?: Master's  Exercise Current Exercise Habits: The patient does not participate in regular exercise at present, Exercise limited by: orthopedic condition(s)  Diet Patient reports consuming 2 meals a day and 4 snack(s) a day Patient reports that her primary diet is: Regular Patient reports that she does have regular access to food.   Depression Screen PHQ 2/9 Scores 12/17/2019 12/11/2019 04/07/2019 03/24/2019 12/11/2018 11/05/2018 08/08/2018  PHQ - 2 Score 0 0 0 0 0 0 0     Fall Risk Fall Risk  12/17/2019 12/11/2019 04/07/2019 03/24/2019 12/11/2018  Falls in the past year? 0 0 0 0 0  Number falls in past yr: 0 - - - -  Injury with Fall? 0 - - - -  Risk for fall due to : No Fall Risks - - - -  Follow up Falls evaluation completed - - - -     Objective:  Laura Chard Waldschmidt seemed alert and oriented and she participated appropriately during our telephone visit.  Blood Pressure Weight BMI  BP Readings from Last 3 Encounters:  12/11/19 114/73  04/07/19 100/64  03/24/19 98/64   Wt Readings from Last 3 Encounters:  12/11/19 186 lb 9.6 oz (84.6 kg)  04/07/19 184 lb (83.5 kg)  03/24/19 186 lb (84.4 kg)   BMI Readings from Last 1 Encounters:  12/11/19 32.03 kg/m    *Unable to obtain current vital signs, weight, and BMI due to telephone visit type  Hearing/Vision  . Mckaela did not seem to have difficulty with hearing/understanding during the telephone conversation . Reports that she has had a formal eye exam by an eye care professional within the past year . Reports that she has not had a formal hearing evaluation within the past year *Unable to fully assess hearing  and vision during telephone visit type  Cognitive Function: 6CIT Screen 12/17/2019 12/11/2018  What Year? 0 points 0 points  What month?  0 points 0 points  What time? 0 points 0 points  Count back from 20 0 points 0 points  Months in reverse 0 points 0 points  Repeat phrase 0 points 0 points  Total Score 0 0   (Normal:0-7, Significant for Dysfunction: >8)  Normal Cognitive Function Screening: Yes   Immunization & Health Maintenance Record Immunization History  Administered Date(s) Administered  . Fluad Quad(high Dose 65+) 11/05/2018, 12/11/2019  . Influenza, High Dose Seasonal PF 01/19/2016, 11/24/2016  . Influenza-Unspecified 11/23/2013  . Moderna SARS-COVID-2 Vaccination 03/06/2019, 04/04/2019, 11/27/2019  . Pneumococcal Conjugate-13 01/19/2016  . Pneumococcal Polysaccharide-23 11/01/2017  . Tdap 12/23/2013  . Zoster 06/28/2015    Health Maintenance  Topic Date Due  . COLONOSCOPY  11/29/2020  . MAMMOGRAM  12/16/2020  . DEXA SCAN  12/10/2021  . TETANUS/TDAP  12/24/2023  . INFLUENZA VACCINE  Completed  . COVID-19 Vaccine  Completed  . Hepatitis C Screening  Completed  . PNA vac Low Risk Adult  Completed       Assessment  This is a routine wellness examination for [employer].  Health Maintenance: Due or Overdue There are no preventive care reminders to display for this patient.  Laura Chard Bates does not need a referral for [employer] Assistance: Care Management:   no Social Work:    no Prescription Assistance:  no Nutrition/Diabetes Education:  no   Plan:  Personalized Goals Goals Addressed            This Visit's Progress   . Increase physical activity       Pt has not maintained last year' s goal. She would like to do the same goal again and will start right away.   12/17/2019 AWV Goal: Exercise for General Health   Patient will verbalize understanding of the benefits of increased physical activity:  Exercising regularly is important. It will improve your overall fitness, flexibility, and endurance.  Regular exercise also will improve your overall health. It can help you control  your weight, reduce stress, and improve your bone density.  Over the next year, patient will increase physical activity as tolerated with a goal of at least 150 minutes of moderate physical activity per week.   You can tell that you are exercising at a moderate intensity if your heart starts beating faster and you start breathing faster but can still hold a conversation.  Moderate-intensity exercise ideas include:  Walking 1 mile (1.6 km) in about 15 minutes  Biking  Hiking  Golfing  Dancing  Water aerobics  Patient will verbalize understanding of everyday activities that increase physical activity by providing examples like the following: ? Yard work, such as: ? Pushing a Conservation officer, nature ? Raking and bagging leaves ? Washing your car ? Pushing a stroller ? Shoveling snow ? Gardening ? Washing windows or floors  Patient will be able to explain general safety guidelines for exercising:   Before you start a new exercise program, talk with your health care provider.  Do not exercise so much that you hurt yourself, feel dizzy, or get very short of breath.  Wear comfortable clothes and wear shoes with good support.  Drink plenty of water while you exercise to prevent dehydration or heat stroke.  Work out until your breathing and your heartbeat get faster.       Personalized Health Maintenance &  Screening Recommendations  Advanced directives: has an advanced directive - a copy HAS NOT been provided.  Lung Cancer Screening Recommended: no (Low Dose CT Chest recommended if Age 40-80 years, 30 pack-year currently smoking OR have quit w/in past 15 years) Hepatitis C Screening recommended: no HIV Screening recommended: no  Advanced Directives: Written information was not prepared per patient's request.  Referrals & Orders No orders of the defined types were placed in this encounter.   Follow-up Plan . Follow-up with Sharion Balloon, FNP as planned . Bring in copy of  Advance Directive once finalized to be scanned into chart    I have personally reviewed and noted the following in the patient's chart:   . Medical and social history . Use of alcohol, tobacco or illicit drugs  . Current medications and supplements . Functional ability and status . Nutritional status . Physical activity . Advanced directives . List of other physicians . Hospitalizations, surgeries, and ER visits in previous 12 months . Vitals . Screenings to include cognitive, depression, and falls . Referrals and appointments  In addition, I have reviewed and discussed with Laura Chard Stockburger certain preventive protocols, quality metrics, and best practice recommendations. A written personalized care plan for preventive services as well as general preventive health recommendations is available and can be mailed to the patient at her request.      Wardell Heath, LPN  [id number]   AVS printed and mailed to patient

## 2020-01-05 ENCOUNTER — Ambulatory Visit (INDEPENDENT_AMBULATORY_CARE_PROVIDER_SITE_OTHER): Payer: Medicare Other | Admitting: Orthopedic Surgery

## 2020-01-05 ENCOUNTER — Encounter: Payer: Self-pay | Admitting: Orthopedic Surgery

## 2020-01-05 ENCOUNTER — Other Ambulatory Visit: Payer: Self-pay

## 2020-01-05 ENCOUNTER — Ambulatory Visit: Payer: Medicare Other

## 2020-01-05 VITALS — BP 135/83 | HR 60 | Ht 64.0 in | Wt 185.0 lb

## 2020-01-05 DIAGNOSIS — M7062 Trochanteric bursitis, left hip: Secondary | ICD-10-CM

## 2020-01-05 DIAGNOSIS — M545 Low back pain, unspecified: Secondary | ICD-10-CM

## 2020-01-05 DIAGNOSIS — M7061 Trochanteric bursitis, right hip: Secondary | ICD-10-CM

## 2020-01-05 MED ORDER — MELOXICAM 7.5 MG PO TABS: 7.5000 mg | ORAL_TABLET | Freq: Every day | ORAL | 5 refills | Status: DC

## 2020-01-05 NOTE — Progress Notes (Signed)
Chief Complaint  Patient presents with  . Hip Pain    Right lateral hip pain   . Back Pain    Right buttock area     History 69 year old female complains of lower back pain pain over her right and left greater trochanter and pain which runs from her back down to her knee.  She reports symptoms for a year worsening in the last 3 to 4 months unrelieved by diclofenac but relieved by recent prednisone Dosepak  Review of systems all systems are reviewed patient reports light sensitivity occasional constipation otherwise negative symptoms  Past Medical History:  Diagnosis Date  . Barrett esophagus   . 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 Depression   . Diverticulosis of colon (without mention of hemorrhage)   . Esophageal reflux   . Essential hypertension, benign   . Gastritis   . Grave's disease   . Hiatal hernia   . Hypothyroidism   . IBS (irritable bowel syndrome)   . Obesity   . Osteopenia   . Sleep apnea   . Symptomatic menopausal or female climacteric states     Past Surgical History:  Procedure Laterality Date  . ABDOMINAL HYSTERECTOMY N/A   . CHOLECYSTECTOMY    . INCONTINENCE SURGERY    . TONSILLECTOMY      Family History  Problem Relation Age of Onset  . Lung cancer Father   . Hypertension Mother   . Arthritis Mother   . Osteoporosis Mother   . Kidney disease Mother        related to APAP use  . Diabetes Other   . Clotting disorder Other   . Breast cancer Other        aunt  . Hypertension Brother   . Hypertension Daughter   . Diabetes Son   . Diabetes Daughter   . Colon cancer Neg Hx     Allergies  Allergen Reactions  . Adhesive [Tape] Itching and Rash  . Latex Rash    BP 135/83   Pulse 60   Ht 5\' 4"  (1.626 m)   Wt 185 lb (83.9 kg)   BMI 31.76 kg/m   Patient is awake alert and oriented x3 mood and affect are normal General appearance shows normal body habitus  There are no gait abnormalities she  is tender over the right and left greater trochanter with normal range of motion in each hip both hips remain stable with normal strength throughout both lower extremities skin is normal in both lower extremities pulses and temperature normal as well normal sensation and coordination of lower extremities  She does have tenderness in the lower back straight leg raises are negative  X-rays show facet arthritis in the lower back L3-S1 with normal disc spaces and slight asymmetry of the trunk with elevated left hemipelvis  Encounter Diagnoses  Name Primary?  . Lumbar pain Yes  . Greater trochanteric bursitis of both hips     Patient agrees to the following treatment plan physical therapy to address her lumbar spine arthritis  Inject both bursa left and right trochanters  Switch from diclofenac to meloxicam   Procedure note injection for right hip bursitis  Verbal consent was obtained for injection of the right hip  Timeout was completed to confirm the injection site  The medications used were 40 mg of Depo-Medrol and 1% lidocaine 3 cc  Anesthesia was provided by ethyl chloride and the skin was prepped with alcohol.  After  cleaning the skin with alcohol a 25-gauge needle was used to inject the greater trochanteric bursa right hip   No complications were noted  Procedure note injection for left hip bursitis  Verbal consent was obtained for injection of the  left hip   Timeout was completed to confirm the injection site  The medications used were 40 mg of Depo-Medrol and 1% lidocaine 3 cc  Anesthesia was provided by ethyl chloride and the skin was prepped with alcohol.  After cleaning the skin with alcohol a 25-gauge needle was used to inject the left hip greater trochanteric bursa  Encounter Diagnoses  Name Primary?  . Lumbar pain Yes  . Greater trochanteric bursitis of both hips    Follow prn  Meds ordered this encounter  Medications  . meloxicam (MOBIC) 7.5 MG  tablet    Sig: Take 1 tablet (7.5 mg total) by mouth daily.    Dispense:  30 tablet    Refill:  5

## 2020-01-05 NOTE — Addendum Note (Signed)
Addended byCandice Camp on: 01/05/2020 03:19 PM   Modules accepted: Orders

## 2020-01-05 NOTE — Patient Instructions (Addendum)
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 This swelling (inflammation) can be painful. This condition may come and go over time. What are the causes?  Injury to the hip.  Overuse of the muscles that surround the hip joint.  An earlier injury or surgery of the hip.  Arthritis or gout.  Diabetes.  Thyroid disease.  Infection.  In some cases, the cause may not be known. What are the signs or symptoms?  Mild or moderate pain in the hip area. Pain may get worse with movement.  Tenderness and swelling of the hip, especially on the outer side of the hip.  In rare cases, the bursa may become infected. This may cause: ? A fever. ? Warmth and redness in the area. Symptoms may come and go. How is this treated? This condition is treated by resting, icing, applying pressure (compression), and raising (elevating) the injured area. You may hear this called the RICE treatment. Treatment may also include:  Using crutches.  Draining fluid out of the bursa to help relieve swelling.  Giving a shot of (injecting) medicine that helps to reduce swelling (cortisone).  Other medicines if the bursa is infected. Follow these instructions at home: Managing pain, stiffness, and swelling   If told, put ice on the painful area. ? Put ice in a plastic bag. ? Place a towel between your skin and the bag. ? Leave the ice on for 20 minutes, 2-3 times a day. ? Raise (elevate) your hip above the level of your heart as much as you can without pain. To do this, try putting a pillow under your hips while you lie down. Stop if this causes pain. Activity  Return to your normal activities as told by your doctor. Ask your doctor what activities are safe for you.  Rest and protect your hip as much  as you can until you feel better. General instructions  Take over-the-counter and prescription medicines only as told by your doctor.  Wear wraps that put pressure on your hip (compression wraps) only as told by your doctor.  Do not use your hip to support your body weight until your doctor says that you can.  Use crutches as told by your doctor.  Gently rub and stretch your injured area as often as is comfortable.  Keep all follow-up visits as told by your doctor. This is important. How is this prevented?  Exercise regularly, as told by your doctor.  Warm up and stretch before being active.  Cool down and stretch after being active.  Avoid activities that bother your hip or cause pain.  Avoid sitting down for long periods at a time. Contact a doctor if:  You have a fever.  You get new symptoms.  You have trouble walking.  You have trouble doing everyday activities.  You have pain that gets worse.  You have pain that does not get better with medicine.  You get red skin on your hip area.  You get a feeling of warmth in your hip area. Get help right away if:  You cannot move your hip.  You have very bad pain. Summary  Hip bursitis is swelling of a fluid-filled sac (bursa) in your hip.  Hip bursitis can be painful.  Symptoms often come and go over time.  This condition is treated with rest, ice, compression, elevation, and medicines. This information is not intended to replace advice given to you by your health care provider. Make sure you discuss any questions you have with your health care provider. Document Revised: 09/17/2017 Document Reviewed: 09/17/2017 Elsevier Patient Education  [place].  Arthritis Arthritis means joint pain. It can also mean joint disease. A joint is a place where bones come together. There are more than 100 types of arthritis. What are the causes? This condition may be caused by:  Wear and tear of a joint. This is the  most common cause.  A lot of acid in the blood, which leads to pain in the joint (gout).  Pain and swelling (inflammation) in a joint.  Infection of a joint.  Injuries in the joint.  A reaction to medicines (allergy). In some cases, the cause may not be known. What are the signs or symptoms? Symptoms of this condition include:  Redness at a joint.  Swelling at a joint.  Stiffness at a joint.  Warmth coming from the joint.  A fever.  A feeling of being sick. How is this treated? This condition may be treated with:  Treating the cause, if it is known.  Rest.  Raising (elevating) the joint.  Putting cold or hot packs on the joint.  Medicines to treat symptoms and reduce pain and swelling.  Shots of medicines (cortisone) into the joint. You may also be told to make changes in your life, such as doing exercises and losing weight. Follow these instructions at home: Medicines  Take over-the-counter and prescription medicines only as told by your doctor.  Do not take aspirin for pain if your doctor says that you may have gout. Activity  Rest your joint if your doctor tells you to.  Avoid activities that make the pain worse.  Exercise your joint regularly as told by your doctor. Try doing exercises like: ? Swimming. ? Water aerobics. ? Biking. ? Walking. Managing pain, stiffness, and swelling      If told, put ice on the affected area. ? Put ice in a plastic bag. ? Place a towel between your skin and the bag. ? Leave the ice on for 20 minutes, 2-3 times per day.  If your joint is swollen, raise (elevate) it above the level of your heart if told by your doctor.  If your joint feels stiff in the morning, try taking a warm shower.  If told, put heat on the affected area. Do this as often as told by your doctor. Use the heat source that your doctor recommends, such as a moist heat pack or a heating pad. If you have diabetes, do not apply heat without asking  your doctor. To apply heat: ? Place a towel between your skin and the heat source. ? Leave the heat on for 20-30 minutes. ? Remove the heat if your skin turns bright red. This is very important if you are unable to feel pain, heat, or cold. You may have a greater risk of getting burned. General instructions  Do not use any products that contain nicotine or tobacco, such as cigarettes, e-cigarettes, and chewing tobacco. If you need help quitting, ask your doctor.  Keep all follow-up visits as told by your doctor. This is important. Contact a doctor if:  The pain gets worse.  You have a fever. Get help right away if:  You have very bad pain in your joint.  You have swelling  in your joint.  Your joint is red.  Many joints become painful and swollen.  You have very bad back pain.  Your leg is very weak.  You cannot control your pee (urine) or poop (stool). Summary  Arthritis means joint pain. It can also mean joint disease. A joint is a place where bones come together.  The most common cause of this condition is wear and tear of a joint.  Symptoms of this condition include redness, swelling, or stiffness of the joint.  This condition is treated with rest, raising the joint, medicines, and putting cold or hot packs on the joint.  Follow your doctor's instructions about medicines, activity, exercises, and other home care treatments. This information is not intended to replace advice given to you by your health care provider. Make sure you discuss any questions you have with your health care provider. Document Revised: 12/17/2017 Document Reviewed: 12/17/2017 Elsevier Patient Education  2020 [employer].

## 2020-01-14 DIAGNOSIS — M5136 Other intervertebral disc degeneration, lumbar region: Secondary | ICD-10-CM | POA: Diagnosis not present

## 2020-01-19 DIAGNOSIS — M5136 Other intervertebral disc degeneration, lumbar region: Secondary | ICD-10-CM | POA: Diagnosis not present

## 2020-01-22 DIAGNOSIS — M5136 Other intervertebral disc degeneration, lumbar region: Secondary | ICD-10-CM | POA: Diagnosis not present

## 2020-02-03 DIAGNOSIS — M5136 Other intervertebral disc degeneration, lumbar region: Secondary | ICD-10-CM | POA: Diagnosis not present

## 2020-02-05 DIAGNOSIS — M5136 Other intervertebral disc degeneration, lumbar region: Secondary | ICD-10-CM | POA: Diagnosis not present

## 2020-02-10 DIAGNOSIS — M5136 Other intervertebral disc degeneration, lumbar region: Secondary | ICD-10-CM | POA: Diagnosis not present

## 2020-02-12 DIAGNOSIS — M5136 Other intervertebral disc degeneration, lumbar region: Secondary | ICD-10-CM | POA: Diagnosis not present

## 2020-02-16 DIAGNOSIS — M5136 Other intervertebral disc degeneration, lumbar region: Secondary | ICD-10-CM | POA: Diagnosis not present

## 2020-02-19 DIAGNOSIS — M5136 Other intervertebral disc degeneration, lumbar region: Secondary | ICD-10-CM | POA: Diagnosis not present

## 2020-02-24 DIAGNOSIS — M5136 Other intervertebral disc degeneration, lumbar region: Secondary | ICD-10-CM | POA: Diagnosis not present

## 2020-02-26 DIAGNOSIS — M5136 Other intervertebral disc degeneration, lumbar region: Secondary | ICD-10-CM | POA: Diagnosis not present

## 2020-03-01 ENCOUNTER — Other Ambulatory Visit: Payer: Self-pay | Admitting: Family

## 2020-03-01 DIAGNOSIS — M5136 Other intervertebral disc degeneration, lumbar region: Secondary | ICD-10-CM | POA: Diagnosis not present

## 2020-03-11 ENCOUNTER — Ambulatory Visit (INDEPENDENT_AMBULATORY_CARE_PROVIDER_SITE_OTHER): Payer: Medicare Other | Admitting: Family Medicine

## 2020-03-11 DIAGNOSIS — J988 Other specified respiratory disorders: Secondary | ICD-10-CM

## 2020-03-11 DIAGNOSIS — R059 Cough, unspecified: Secondary | ICD-10-CM | POA: Diagnosis not present

## 2020-03-11 DIAGNOSIS — Z8709 Personal history of other diseases of the respiratory system: Secondary | ICD-10-CM

## 2020-03-11 MED ORDER — AZITHROMYCIN 250 MG PO TABS: ORAL_TABLET | ORAL | 0 refills | Status: DC

## 2020-03-11 MED ORDER — METHYLPREDNISOLONE 4 MG PO TBPK: ORAL_TABLET | ORAL | 0 refills | Status: DC

## 2020-03-11 MED ORDER — GUAIFENESIN-DM 100-10 MG/5ML PO SYRP: 5.0000 mL | ORAL_SOLUTION | ORAL | 0 refills | Status: DC | PRN

## 2020-03-11 NOTE — Progress Notes (Signed)
Virtual Visit via Telephone Note  I connected with Laura James on 03/11/20 at 1:24 PM by telephone and verified that I am speaking with the correct person using two identifiers. Laura James is currently located at home and nobody is currently with her during this visit. The provider, Loman Brooklyn, FNP is located in their office at time of visit.  I discussed the limitations, risks, security and privacy concerns of performing an evaluation and management service by telephone and the availability of in person appointments. I also discussed with the patient that there may be a patient responsible charge related to this service. The patient expressed understanding and agreed to proceed.  Subjective: PCP: Sharion Balloon, FNP  Chief Complaint  Patient presents with  . Cough   Patient complains of cough, chest congestion, headache, runny nose, sore throat, postnasal drainage, shortness of breath and wheezing. Onset of symptoms was 3 days ago, unchanged since that time. She is drinking plenty of fluids. Evaluation to date: at home COVID-19 test was negative. Treatment to date: Alka-selzter plus, Flonase and Albuterol inhaler. She has a history of upper airway cough syndrome. She does not smoke.    ROS: Per HPI  Current Outpatient Medications:  .  albuterol (PROAIR HFA) 108 (90 Base) MCG/ACT inhaler, INHALE 2 PUFFS BY MOUTH EVERY 6 HOURS AS NEEDED FOR WHEEZE OR SHORTNESS OF BREATH, Disp: 8.5 each, Rfl: 2 .  aspirin 81 MG tablet, Take 81 mg by mouth daily., Disp: , Rfl:  .  atenolol (TENORMIN) 25 MG tablet, Take 0.5 tablets (12.5 mg total) by mouth daily. (Needs to be seen before next refill), Disp: 30 tablet, Rfl: 5 .  atorvastatin (LIPITOR) 10 MG tablet, Take 1 tablet (10 mg total) by mouth daily. (Needs to be seen before next refill), Disp: 90 tablet, Rfl: 1 .  cetirizine (ZYRTEC) 10 MG tablet, Take 10 mg by mouth daily as needed. , Disp: , Rfl:  .  cholecalciferol (VITAMIN D) 1000 units  tablet, Take 1 tablet (1,000 Units total) by mouth daily., Disp: , Rfl:  .  Cyanocobalamin (B-12) 3000 MCG CAPS, Take 1 capsule by mouth daily., Disp: , Rfl:  .  diclofenac (VOLTAREN) 75 MG EC tablet, Take 1 tablet (75 mg total) by mouth 2 (two) times daily., Disp: 60 tablet, Rfl: 3 .  docusate sodium (COLACE) 100 MG capsule, Take 100 mg by mouth daily as needed for mild constipation., Disp: , Rfl:  .  fluticasone (FLONASE) 50 MCG/ACT nasal spray, Place 2 sprays into both nostrils daily., Disp: 16 g, Rfl: 6 .  hydrochlorothiazide (HYDRODIURIL) 25 MG tablet, Take 1 tablet (25 mg total) by mouth daily. (Needs to be seen before next refill), Disp: 90 tablet, Rfl: 1 .  Iron, Ferrous Sulfate, 325 (65 Fe) MG TABS, Take 325 mg by mouth 2 (two) times daily with a meal., Disp: 180 tablet, Rfl: 5 .  magnesium oxide (MAG-OX) 400 MG tablet, Take 400 mg by mouth daily., Disp: , Rfl:  .  meloxicam (MOBIC) 7.5 MG tablet, Take 1 tablet (7.5 mg total) by mouth daily., Disp: 30 tablet, Rfl: 5 .  predniSONE (STERAPRED UNI-PAK 21 TAB) 10 MG (21) TBPK tablet, Use as directed, Disp: 21 tablet, Rfl: 0  Allergies  Allergen Reactions  . Adhesive [Tape] Itching and Rash  . Latex Rash   Past Medical History:  Diagnosis Date  . Barrett esophagus   . 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  Depression   . Diverticulosis of colon (without mention of hemorrhage)   . Esophageal reflux   . Essential hypertension, benign   . Gastritis   . Grave's disease   . Hiatal hernia   . Hypothyroidism   . IBS (irritable bowel syndrome)   . Obesity   . Osteopenia   . Sleep apnea   . Symptomatic menopausal or female climacteric states     Observations/Objective: A&O  No respiratory distress or wheezing audible over the phone Mood, judgement, and thought processes all WNL  Assessment and Plan: 1. Respiratory infection Discussed symptom management. - methylPREDNISolone (MEDROL  DOSEPAK) 4 MG TBPK tablet; Use as directed.  Dispense: 21 each; Refill: 0 - azithromycin (ZITHROMAX Z-PAK) 250 MG tablet; Take 2 tablets (500 mg) PO today, then 1 tablet (250 mg) PO daily x4 days.  Dispense: 6 tablet; Refill: 0 - guaiFENesin-dextromethorphan (ROBITUSSIN DM) 100-10 MG/5ML syrup; Take 5 mLs by mouth every 4 (four) hours as needed for cough.  Dispense: 118 mL; Refill: 0  2. History of chronic bronchitis - methylPREDNISolone (MEDROL DOSEPAK) 4 MG TBPK tablet; Use as directed.  Dispense: 21 each; Refill: 0 - azithromycin (ZITHROMAX Z-PAK) 250 MG tablet; Take 2 tablets (500 mg) PO today, then 1 tablet (250 mg) PO daily x4 days.  Dispense: 6 tablet; Refill: 0 - guaiFENesin-dextromethorphan (ROBITUSSIN DM) 100-10 MG/5ML syrup; Take 5 mLs by mouth every 4 (four) hours as needed for cough.  Dispense: 118 mL; Refill: 0  3. Cough - guaiFENesin-dextromethorphan (ROBITUSSIN DM) 100-10 MG/5ML syrup; Take 5 mLs by mouth every 4 (four) hours as needed for cough.  Dispense: 118 mL; Refill: 0 - Novel Coronavirus, NAA ([facility]); Future   Follow Up Instructions:  I discussed the assessment and treatment plan with the patient. The patient was provided an opportunity to ask questions and all were answered. The patient agreed with the plan and demonstrated an understanding of the instructions.   The patient was advised to call back or seek an in-person evaluation if the symptoms worsen or if the condition fails to improve as anticipated.  The above assessment and management plan was discussed with the patient. The patient verbalized understanding of and has agreed to the management plan. Patient is aware to call the clinic if symptoms persist or worsen. Patient is aware when to return to the clinic for a follow-up visit. Patient educated on when it is appropriate to go to the emergency department.   Time call ended: 1:35 PM  I provided 11 minutes of non-face-to-face time during this  encounter.  Hendricks Limes, MSN, APRN, FNP-C [facility] 03/11/20

## 2020-03-11 NOTE — Addendum Note (Signed)
Addended by: Liliane Bade on: 03/11/2020 03:35 PM   Modules accepted: Orders

## 2020-03-12 LAB — SARS-COV-2, NAA 2 DAY TAT

## 2020-03-12 LAB — NOVEL CORONAVIRUS, NAA: SARS-CoV-2, NAA: NOT DETECTED

## 2020-05-07 ENCOUNTER — Other Ambulatory Visit: Payer: Self-pay | Admitting: Family

## 2020-05-07 DIAGNOSIS — I10 Essential (primary) hypertension: Secondary | ICD-10-CM

## 2020-06-04 ENCOUNTER — Other Ambulatory Visit: Payer: Self-pay | Admitting: Family

## 2020-06-04 DIAGNOSIS — E785 Hyperlipidemia, unspecified: Secondary | ICD-10-CM

## 2020-06-05 ENCOUNTER — Other Ambulatory Visit: Payer: Self-pay | Admitting: Family

## 2020-06-05 DIAGNOSIS — I10 Essential (primary) hypertension: Secondary | ICD-10-CM

## 2020-06-10 ENCOUNTER — Other Ambulatory Visit: Payer: Self-pay

## 2020-06-10 ENCOUNTER — Encounter: Payer: Self-pay | Admitting: Family

## 2020-06-10 ENCOUNTER — Ambulatory Visit (INDEPENDENT_AMBULATORY_CARE_PROVIDER_SITE_OTHER): Payer: Medicare Other | Admitting: Family

## 2020-06-10 VITALS — BP 126/77 | HR 73 | Temp 98.0°F | Ht 64.0 in | Wt 190.2 lb

## 2020-06-10 DIAGNOSIS — M8949 Other hypertrophic osteoarthropathy, multiple sites: Secondary | ICD-10-CM

## 2020-06-10 DIAGNOSIS — I10 Essential (primary) hypertension: Secondary | ICD-10-CM | POA: Diagnosis not present

## 2020-06-10 DIAGNOSIS — G4733 Obstructive sleep apnea (adult) (pediatric): Secondary | ICD-10-CM

## 2020-06-10 DIAGNOSIS — R6889 Other general symptoms and signs: Secondary | ICD-10-CM | POA: Diagnosis not present

## 2020-06-10 DIAGNOSIS — K219 Gastro-esophageal reflux disease without esophagitis: Secondary | ICD-10-CM

## 2020-06-10 DIAGNOSIS — E785 Hyperlipidemia, unspecified: Secondary | ICD-10-CM | POA: Diagnosis not present

## 2020-06-10 DIAGNOSIS — M159 Polyosteoarthritis, unspecified: Secondary | ICD-10-CM

## 2020-06-10 LAB — CMP14+EGFR
ALT: 22 IU/L (ref 0–32)
AST: 23 IU/L (ref 0–40)
Albumin/Globulin Ratio: 1.7 (ref 1.2–2.2)
Albumin: 4.1 g/dL (ref 3.7–4.7)
Alkaline Phosphatase: 100 IU/L (ref 44–121)
BUN/Creatinine Ratio: 14 (ref 12–28)
BUN: 12 mg/dL (ref 8–27)
Bilirubin Total: 0.5 mg/dL (ref 0.0–1.2)
CO2: 26 mmol/L (ref 20–29)
Calcium: 9.9 mg/dL (ref 8.7–10.3)
Chloride: 101 mmol/L (ref 96–106)
Creatinine, Ser: 0.86 mg/dL (ref 0.57–1.00)
Globulin, Total: 2.4 g/dL (ref 1.5–4.5)
Glucose: 98 mg/dL (ref 65–99)
Potassium: 3.9 mmol/L (ref 3.5–5.2)
Sodium: 141 mmol/L (ref 134–144)
Total Protein: 6.5 g/dL (ref 6.0–8.5)
eGFR: 72 mL/min/{1.73_m2} (ref >59)

## 2020-06-10 LAB — CBC WITH DIFFERENTIAL/PLATELET
Basophils Absolute: 0.1 10*3/uL (ref 0.0–0.2)
Basos: 0 %
EOS (ABSOLUTE): 0.5 10*3/uL — ABNORMAL HIGH (ref 0.0–0.4)
Eos: 4 %
Hematocrit: 39 % (ref 34.0–46.6)
Hemoglobin: 13.1 g/dL (ref 11.1–15.9)
Immature Grans (Abs): 0 10*3/uL (ref 0.0–0.1)
Immature Granulocytes: 0 %
Lymphocytes Absolute: 2 10*3/uL (ref 0.7–3.1)
Lymphs: 17 %
MCH: 28.4 pg (ref 26.6–33.0)
MCHC: 33.6 g/dL (ref 31.5–35.7)
MCV: 85 fL (ref 79–97)
Monocytes Absolute: 0.9 10*3/uL (ref 0.1–0.9)
Monocytes: 8 %
Neutrophils Absolute: 8.7 10*3/uL — ABNORMAL HIGH (ref 1.4–7.0)
Neutrophils: 71 %
Platelets: 314 10*3/uL (ref 150–450)
RBC: 4.61 x10E6/uL (ref 3.77–5.28)
RDW: 13.4 % (ref 11.7–15.4)
WBC: 12.1 10*3/uL — ABNORMAL HIGH (ref 3.4–10.8)

## 2020-06-10 NOTE — Progress Notes (Signed)
Subjective:    Patient ID: Laura James, female    DOB: [birth year], 70 y.o.   MRN: [id number]  Chief Complaint  Patient presents with  . Medical Management of Chronic Issues  . Hypertension  . Fatigue    X 1 mth. And chronic bronchitis, still having Right hip problem has been to PT and ortho has had to steroid shots. And is doing water aerobics now and hip is worse.    Pt presents to the office today for chronic follow up. PT is followed by Ortho for back pain and bursitis of hip.  Hypertension This is a chronic problem. The current episode started more than 1 year ago. The problem has been resolved since onset. The problem is controlled. Associated symptoms include malaise/fatigue. Pertinent negatives include no peripheral edema or shortness of breath. Risk factors for coronary artery disease include dyslipidemia and obesity. The current treatment provides moderate improvement. There is no history of CVA or heart failure.  Gastroesophageal Reflux She complains of belching and heartburn. This is a chronic problem. The current episode started more than 1 year ago. The problem occurs occasionally. The problem has been waxing and waning. Risk factors include obesity. She has tried a PPI for the symptoms. The treatment provided moderate relief.  Arthritis Presents for follow-up visit. She complains of pain and stiffness. The symptoms have been stable. Affected locations include the left hip and right hip (back). Her pain is at a severity of 8/10.  Hyperlipidemia This is a chronic problem. The current episode started more than 1 year ago. The problem is controlled. Recent lipid tests were reviewed and are normal. Pertinent negatives include no shortness of breath. Current antihyperlipidemic treatment includes statins. The current treatment provides moderate improvement of lipids. Risk factors for coronary artery disease include dyslipidemia, hypertension, a sedentary lifestyle and post-menopausal.    OSA Using CPAP at times.    Review of Systems  Constitutional: Positive for malaise/fatigue.  Respiratory: Negative for shortness of breath.   Gastrointestinal: Positive for heartburn.  Musculoskeletal: Positive for arthritis and stiffness.  All other systems reviewed and are negative.      Objective:   Physical Exam Vitals reviewed.  Constitutional:      General: She is not in acute distress.    Appearance: She is well-developed.  HENT:     Head: Normocephalic and atraumatic.     Right Ear: Tympanic membrane normal.     Left Ear: Tympanic membrane normal.  Eyes:     Pupils: Pupils are equal, round, and reactive to light.  Neck:     Thyroid: No thyromegaly.  Cardiovascular:     Rate and Rhythm: Normal rate and regular rhythm.     Heart sounds: Normal heart sounds. No murmur heard.   Pulmonary:     Effort: Pulmonary effort is normal. No respiratory distress.     Breath sounds: Normal breath sounds. No wheezing.  Abdominal:     General: Bowel sounds are normal. There is no distension.     Palpations: Abdomen is soft.     Tenderness: There is no abdominal tenderness.  Musculoskeletal:        General: No tenderness. Normal range of motion.     Cervical back: Normal range of motion and neck supple.  Skin:    General: Skin is warm and dry.  Neurological:     Mental Status: She is alert and oriented to person, place, and time.     Cranial Nerves: No cranial nerve  deficit.     Deep Tendon Reflexes: Reflexes are normal and symmetric.  Psychiatric:        Behavior: Behavior normal.        Thought Content: Thought content normal.        Judgment: Judgment normal.       BP 126/77   Pulse 73   Temp 98 F (36.7 C) (Temporal)   Ht 5' 4"  (1.626 m)   Wt 190 lb 3.2 oz (86.3 kg)   BMI 32.65 kg/m      Assessment & Plan:  Laura James comes in today with chief complaint of Medical Management of Chronic Issues, Hypertension, and Fatigue (X 1 mth. And chronic  bronchitis, still having Right hip problem has been to PT and ortho has had to steroid shots. And is doing water aerobics now and hip is worse. )   Diagnosis and orders addressed:  1. Essential hypertension, benign - CMP14+EGFR - CBC with Differential/Platelet  2. Gastroesophageal reflux disease without esophagitis - CMP14+EGFR - CBC with Differential/Platelet  3. Primary osteoarthritis involving multiple joints - CMP14+EGFR - CBC with Differential/Platelet  4. Hyperlipidemia, unspecified hyperlipidemia type - CMP14+EGFR - CBC with Differential/Platelet  5. Obstructive sleep apnea - CMP14+EGFR - CBC with Differential/Platelet   Labs pending Health Maintenance reviewed Diet and exercise encouraged  Follow up plan: 6 months   Laura Dun, FNP

## 2020-06-10 NOTE — Patient Instructions (Signed)
Osteoarthritis  Osteoarthritis is a type of arthritis. It refers to joint pain or joint disease. Osteoarthritis affects tissue that covers the ends of bones in joints (cartilage). Cartilage acts as a cushion between the bones and helps them move smoothly. Osteoarthritis occurs when cartilage in the joints gets worn down. Osteoarthritis is sometimes called "wear and tear" arthritis. Osteoarthritis is the most common form of arthritis. It often occurs in older people. It is a condition that gets worse over time. The joints most often affected by this condition are in the fingers, toes, hips, knees, and spine, including the neck and lower back. What are the causes? This condition is caused by the wearing down of cartilage that covers the ends of bones. What increases the risk? The following factors may make you more likely to develop this condition:  Being age 50 or older.  Obesity.  Overuse of joints.  Past injury of a joint.  Past surgery on a joint.  Family history of osteoarthritis. What are the signs or symptoms? The main symptoms of this condition are pain, swelling, and stiffness in the joint. Other symptoms may include:  An enlarged joint.  More pain and further damage caused by small pieces of bone or cartilage that break off and float inside of the joint.  Small deposits of bone (osteophytes) that grow on the edges of the joint.  A grating or scraping feeling inside the joint when you move it.  Popping or creaking sounds when you move.  Difficulty walking or exercising.  An inability to grip items, twist your hand(s), or control the movements of your hands and fingers. How is this diagnosed? This condition may be diagnosed based on:  Your medical history.  A physical exam.  Your symptoms.  X-rays of the affected joint(s).  Blood tests to rule out other types of arthritis. How is this treated? There is no cure for this condition, but treatment can help control  pain and improve joint function. Treatment may include a combination of therapies, such as:  Pain relief techniques, such as: ? Applying heat and cold to the joint. ? Massage. ? A form of talk therapy called cognitive behavioral therapy (CBT). This therapy helps you set goals and follow up on the changes that you make.  Medicines for pain and inflammation. The medicines can be taken by mouth or applied to the skin. They include: ? NSAIDs, such as ibuprofen. ? Prescription medicines. ? Strong anti-inflammatory medicines (corticosteroids). ? Certain nutritional supplements.  A prescribed exercise program. You may work with a physical therapist.  Assistive devices, such as a brace, wrap, splint, specialized glove, or cane.  A weight control plan.  Surgery, such as: ? An osteotomy. This is done to reposition the bones and relieve pain or to remove loose pieces of bone and cartilage. ? Joint replacement surgery. You may need this surgery if you have advanced osteoarthritis. Follow these instructions at home: Activity  Rest your affected joints as told by your health care provider.  Exercise as told by your health care provider. He or she may recommend specific types of exercise, such as: ? Strengthening exercises. These are done to strengthen the muscles that support joints affected by arthritis. ? Aerobic activities. These are exercises, such as brisk walking or water aerobics, that increase your heart rate. ? Range-of-motion activities. These help your joints move more easily. ? Balance and agility exercises. Managing pain, stiffness, and swelling  If directed, apply heat to the affected area as often   as told by your health care provider. Use the heat source that your health care provider recommends, such as a moist heat pack or a heating pad. ? If you have a removable assistive device, remove it as told by your health care provider. ? Place a towel between your skin and the heat  source. If your health care provider tells you to keep the assistive device on while you apply heat, place a towel between the assistive device and the heat source. ? Leave the heat on for 20-30 minutes. ? Remove the heat if your skin turns bright red. This is especially important if you are unable to feel pain, heat, or cold. You may have a greater risk of getting burned.  If directed, put ice on the affected area. To do this: ? If you have a removable assistive device, remove it as told by your health care provider. ? Put ice in a plastic bag. ? Place a towel between your skin and the bag. If your health care provider tells you to keep the assistive device on during icing, place a towel between the assistive device and the bag. ? Leave the ice on for 20 minutes, 2-3 times a day. ? Move your fingers or toes often to reduce stiffness and swelling. ? Raise (elevate) the injured area above the level of your heart while you are sitting or lying down.      General instructions  Take over-the-counter and prescription medicines only as told by your health care provider.  Maintain a healthy weight. Follow instructions from your health care provider for weight control.  Do not use any products that contain nicotine or tobacco, such as cigarettes, e-cigarettes, and chewing tobacco. If you need help quitting, ask your health care provider.  Use assistive devices as told by your health care provider.  Keep all follow-up visits as told by your health care provider. This is important. Where to find more information  National Institute of Arthritis and Musculoskeletal and Skin Diseases: www.niams.nih.gov  National Institute on Aging: www.nia.nih.gov  American College of Rheumatology: www.rheumatology.org Contact a health care provider if:  You have redness, swelling, or a feeling of warmth in a joint that gets worse.  You have a fever along with joint or muscle aches.  You develop a  rash.  You have trouble doing your normal activities. Get help right away if:  You have pain that gets worse and is not relieved by pain medicine. Summary  Osteoarthritis is a type of arthritis that affects tissue covering the ends of bones in joints (cartilage).  This condition is caused by the wearing down of cartilage that covers the ends of bones.  The main symptom of this condition is pain, swelling, and stiffness in the joint.  There is no cure for this condition, but treatment can help control pain and improve joint function. This information is not intended to replace advice given to you by your health care provider. Make sure you discuss any questions you have with your health care provider. Document Revised: 01/06/2019 Document Reviewed: 01/06/2019 Elsevier Patient Education  2021 [employer] Inc.  

## 2020-06-15 LAB — SPECIMEN STATUS REPORT

## 2020-06-15 LAB — VITAMIN B12: Vitamin B-12: >2000 pg/mL — ABNORMAL HIGH (ref 232–1245)

## 2020-06-15 LAB — TSH: TSH: 1.58 u[IU]/mL (ref 0.450–4.500)

## 2020-06-15 LAB — IRON: Iron: 29 ug/dL (ref 27–139)

## 2020-06-15 NOTE — Progress Notes (Signed)
Pt returning call about labs  

## 2020-06-29 ENCOUNTER — Other Ambulatory Visit: Payer: Self-pay | Admitting: Family

## 2020-06-29 DIAGNOSIS — I10 Essential (primary) hypertension: Secondary | ICD-10-CM

## 2020-07-03 ENCOUNTER — Other Ambulatory Visit: Payer: Self-pay | Admitting: Orthopedic Surgery

## 2020-07-03 DIAGNOSIS — M545 Low back pain, unspecified: Secondary | ICD-10-CM

## 2020-07-03 DIAGNOSIS — M7062 Trochanteric bursitis, left hip: Secondary | ICD-10-CM

## 2020-08-03 IMAGING — MR MR LUMBAR SPINE W/O CM
4 of 5 series · 17 of 48 positions shown · non-contrast
Comparison: Radiographs dated 05/20/2013 and lumbar MRI dated
05/28/2013

CLINICAL DATA: Low back pain extending into both legs for 6 months.

EXAM:
MRI LUMBAR SPINE WITHOUT CONTRAST
TECHNIQUE: Multiplanar, multisequence MR imaging of the lumbar spine was
performed. No intravenous contrast was administered.

[Series 3: T2 · sagittal · 4.0mm · 0.70mm/px · 6 of 13 slices shown (1 of 2)]
[im 1/13]
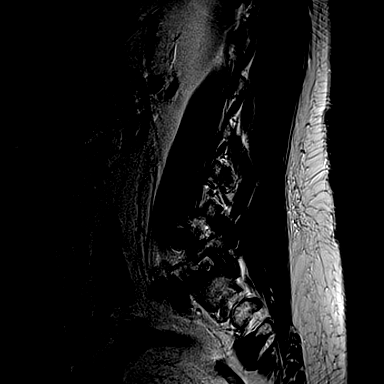
[im 3/13]
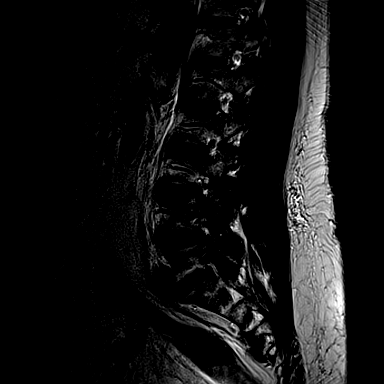
[im 5/13]
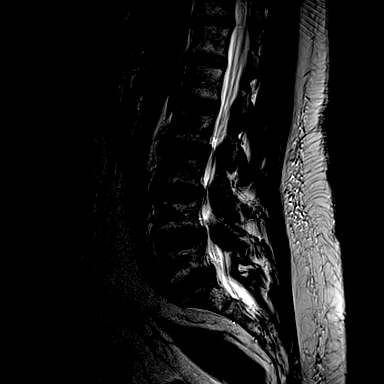
[im 8/13]
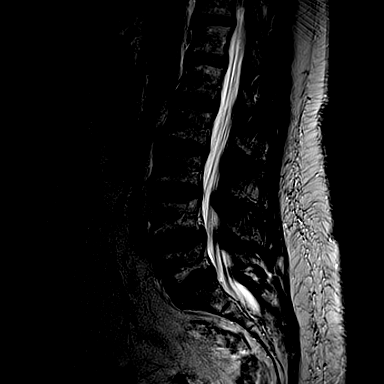
[im 10/13]
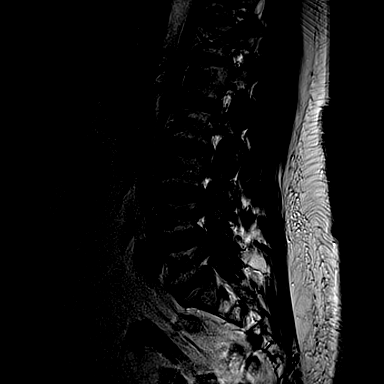
[im 13/13]
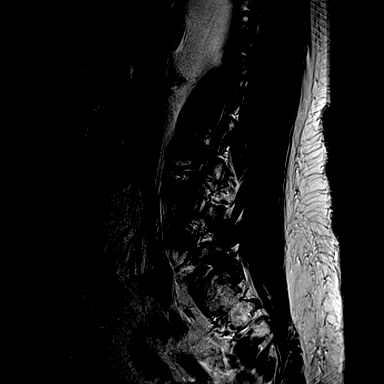

[Series 4: T1 · sagittal · 4.0mm · 0.42mm/px · 3 of 13 slices shown (1 of 2)]
[im 1/13]
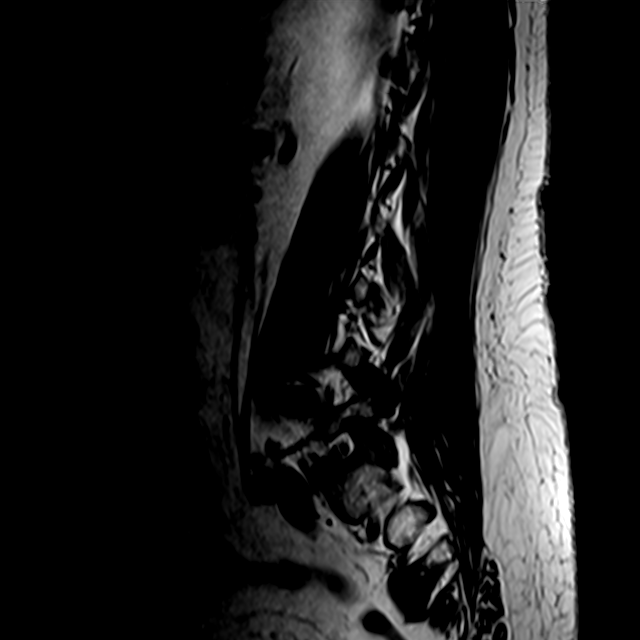
[im 7/13]
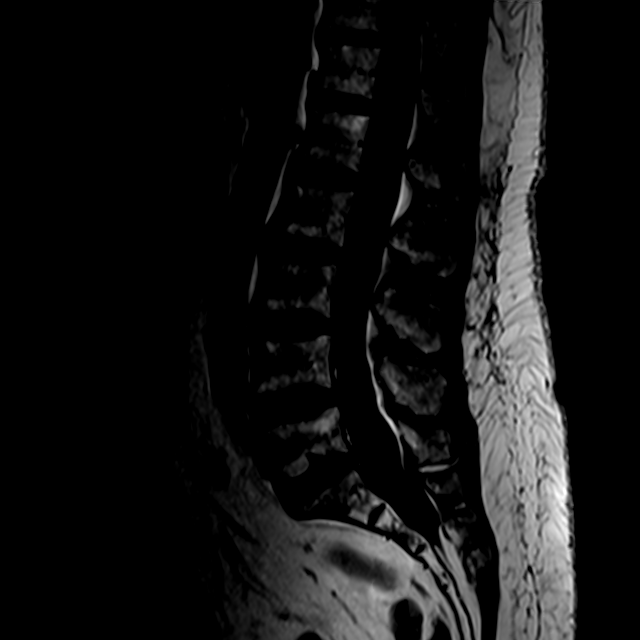
[im 13/13]
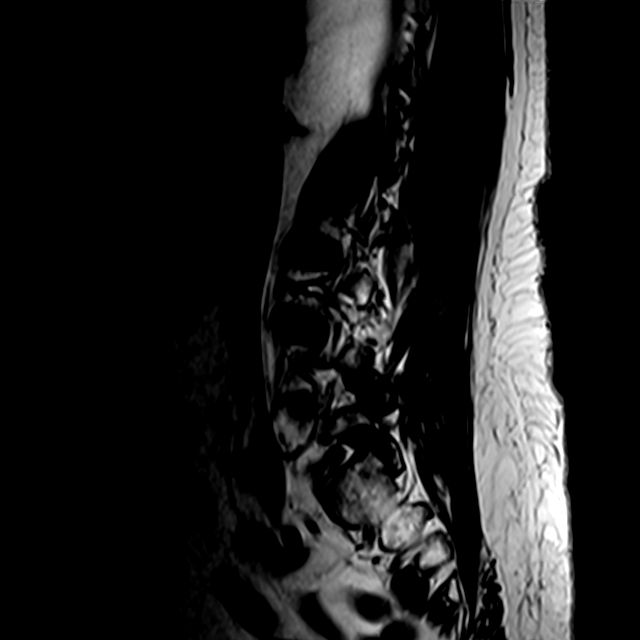

[Series 6: T2 · axial · 4.0mm · 0.23mm/px · z∈[-75,+69]mm · 5 of 38 slices shown (2 of 2)]
[im 3/38]
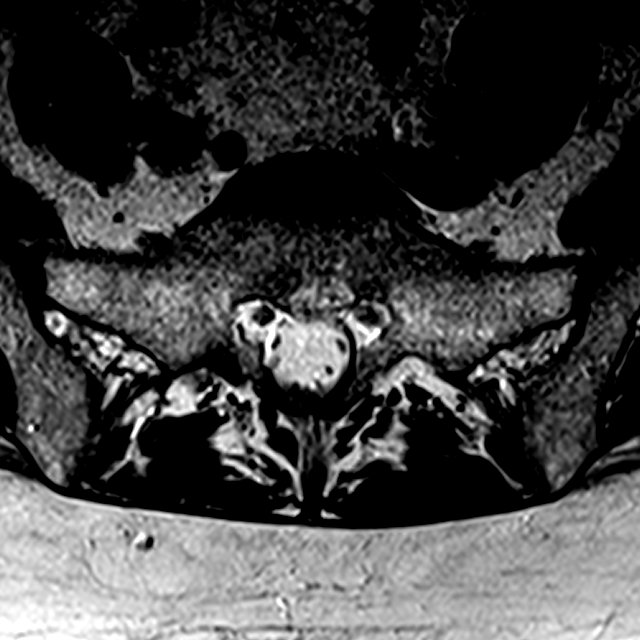
[im 5/38]
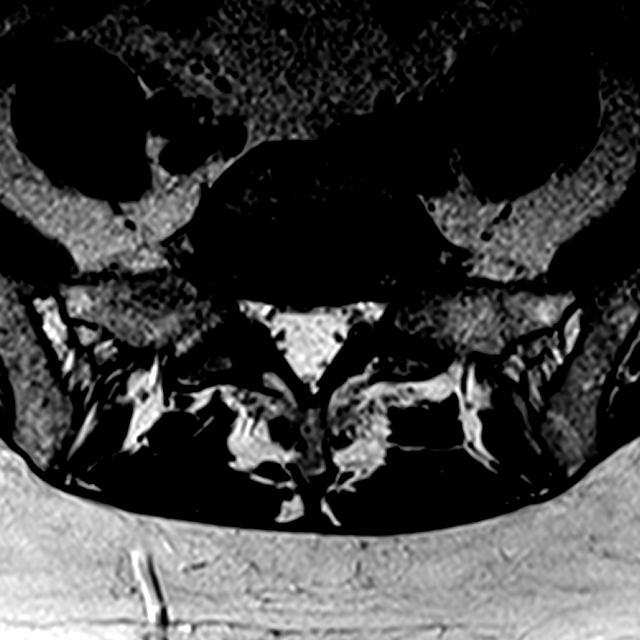
[im 8/38]
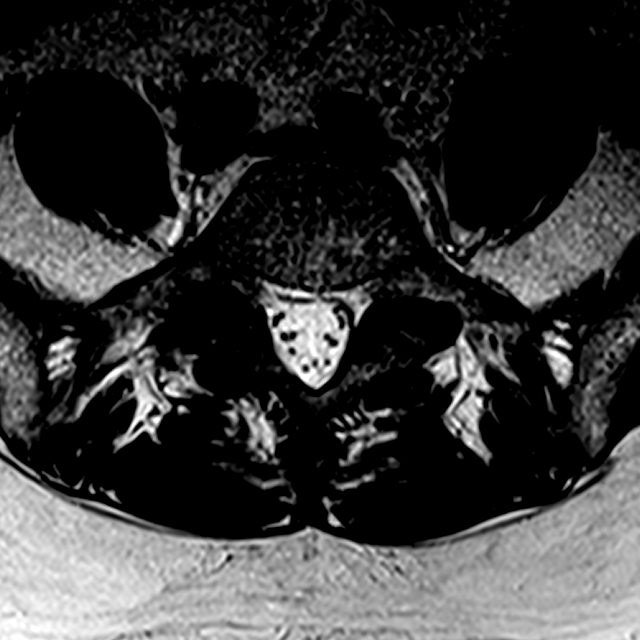
[im 20/38]
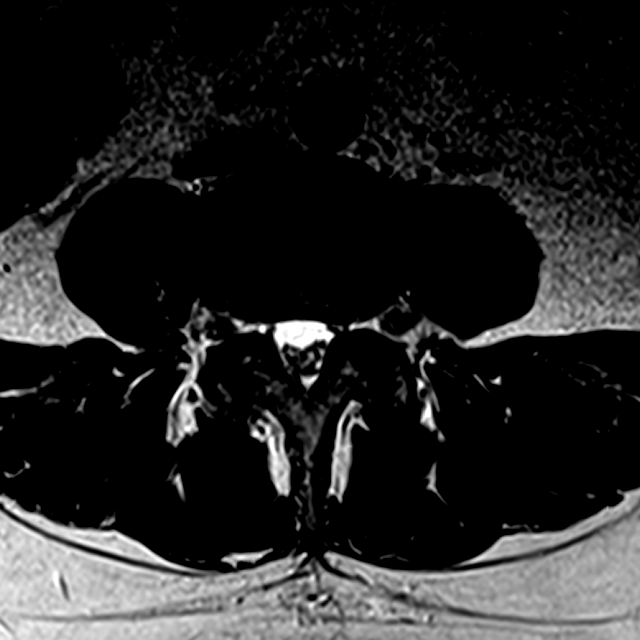
[im 33/38]
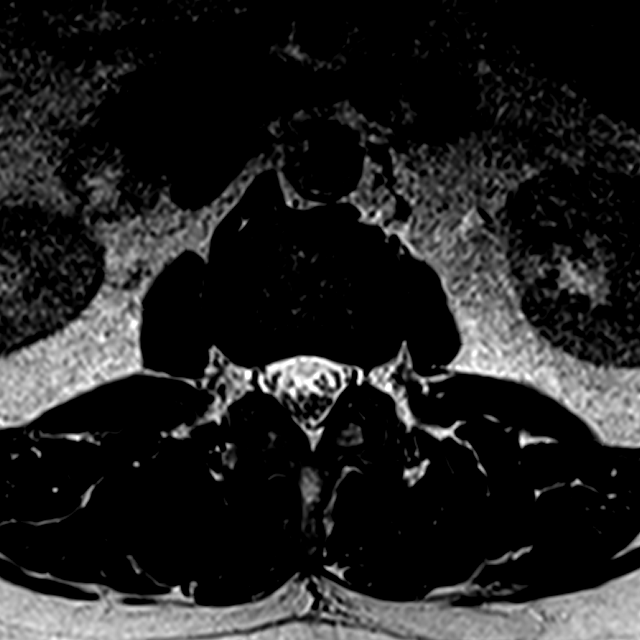

[Series 7: T1 · axial · 4.0mm · 0.23mm/px · z∈[-67,+69]mm · 3 of 38 slices shown (2 of 2)]
[im 5/38]
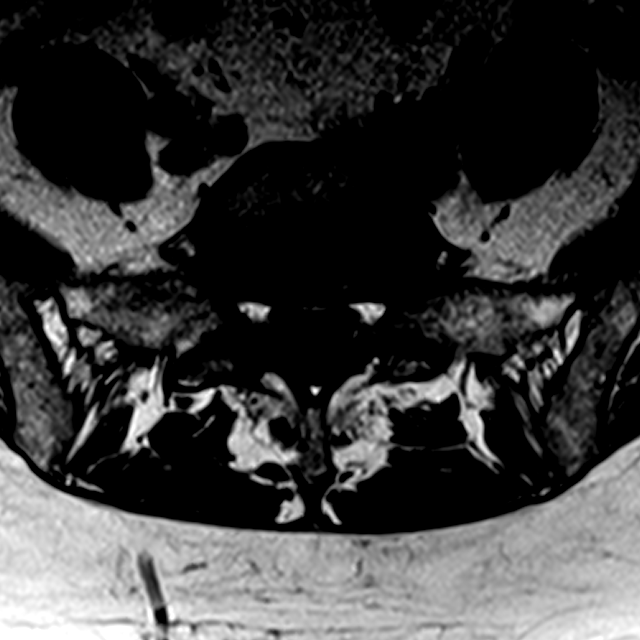
[im 20/38]
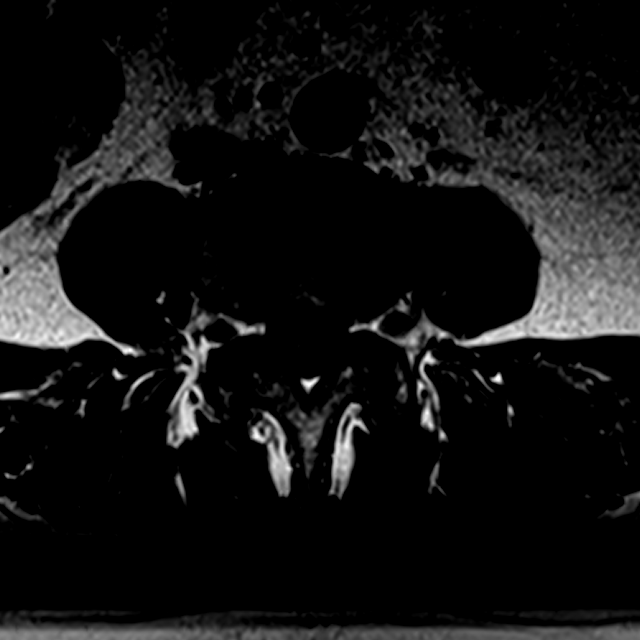
[im 33/38]
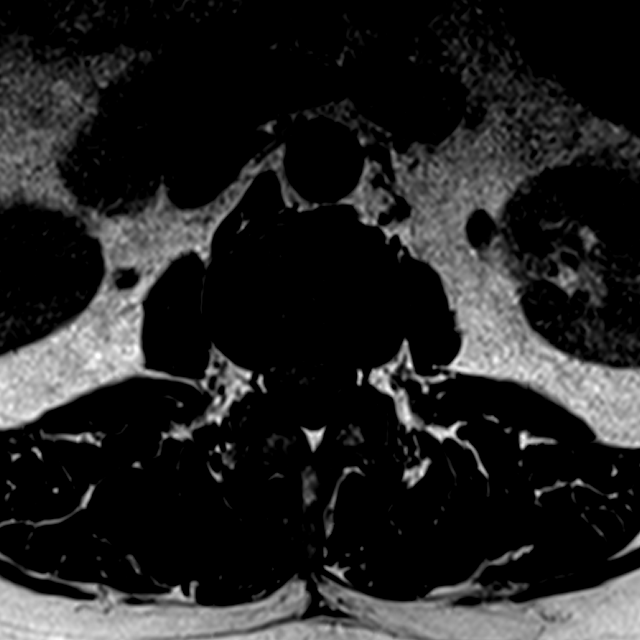

[17 of 48 positions shown; findings below may reference images not displayed]

FINDINGS: Segmentation:  Standard.

Alignment:  Physiologic.

Vertebrae:  No fracture, evidence of discitis, or bone lesion.

Conus medullaris and cauda equina: Conus extends to the L1 level.
Conus and cauda equina appear normal.

Paraspinal and other soft tissues: Negative.

Disc levels:

L1-2: Normal.

L2-3: Tiny disc bulges into the neural foramina without neural
impingement. Otherwise negative.

L3-4: Tiny disc bulges into the neural foramina without neural
impingement. Slight hypertrophy of the ligamentum flavum with slight
degenerative changes of the facet joints.

L4-5: Minimal broad-based disc bulge. There is narrowing of both
lateral recesses, new since the prior study due to increased
hypertrophy of the ligamentum flavum and facet joints, increased
since 0344. This might affect L5 nerves. No foraminal stenosis.

L5-S1: Small disc bulges into both neural foramina without
impingement. Slight hypertrophy and arthritis of right facet joint,
increased since 0344.
IMPRESSION: 1. Progressive degenerative disc disease at L4-5 with increased
narrowing of both lateral recesses which might affect either or both
L5 nerves.
2. Progressive arthritis of the facet joints at L3-4, L4-5, and
L5-S1 as described above.

## 2020-08-18 DIAGNOSIS — H04123 Dry eye syndrome of bilateral lacrimal glands: Secondary | ICD-10-CM | POA: Diagnosis not present

## 2020-08-18 DIAGNOSIS — H43813 Vitreous degeneration, bilateral: Secondary | ICD-10-CM | POA: Diagnosis not present

## 2020-08-18 DIAGNOSIS — Z961 Presence of intraocular lens: Secondary | ICD-10-CM | POA: Diagnosis not present

## 2020-08-18 DIAGNOSIS — H40013 Open angle with borderline findings, low risk, bilateral: Secondary | ICD-10-CM | POA: Diagnosis not present

## 2020-08-18 DIAGNOSIS — H25812 Combined forms of age-related cataract, left eye: Secondary | ICD-10-CM | POA: Diagnosis not present

## 2020-08-18 DIAGNOSIS — H35371 Puckering of macula, right eye: Secondary | ICD-10-CM | POA: Diagnosis not present

## 2020-08-30 ENCOUNTER — Other Ambulatory Visit: Payer: Self-pay

## 2020-08-30 ENCOUNTER — Ambulatory Visit
Admission: RE | Admit: 2020-08-30 | Discharge: 2020-08-30 | Disposition: A | Discharge status: Home / Self Care | Payer: Medicare Other | Source: Ambulatory Visit | Attending: Family | Admitting: Family

## 2020-08-30 DIAGNOSIS — Z1231 Encounter for screening mammogram for malignant neoplasm of breast: Secondary | ICD-10-CM | POA: Diagnosis not present

## 2020-09-01 ENCOUNTER — Other Ambulatory Visit: Payer: Self-pay | Admitting: Family

## 2020-09-01 DIAGNOSIS — E785 Hyperlipidemia, unspecified: Secondary | ICD-10-CM

## 2020-09-03 ENCOUNTER — Other Ambulatory Visit: Payer: Self-pay | Admitting: Family

## 2020-09-03 DIAGNOSIS — R928 Other abnormal and inconclusive findings on diagnostic imaging of breast: Secondary | ICD-10-CM

## 2020-09-13 ENCOUNTER — Other Ambulatory Visit: Payer: Self-pay | Admitting: Family

## 2020-09-13 ENCOUNTER — Other Ambulatory Visit: Payer: Self-pay

## 2020-09-13 ENCOUNTER — Ambulatory Visit
Admission: RE | Admit: 2020-09-13 | Discharge: 2020-09-13 | Disposition: A | Discharge status: Home / Self Care | Payer: Medicare Other | Source: Ambulatory Visit | Attending: Family | Admitting: Family

## 2020-09-13 DIAGNOSIS — R922 Inconclusive mammogram: Secondary | ICD-10-CM | POA: Diagnosis not present

## 2020-09-13 DIAGNOSIS — R921 Mammographic calcification found on diagnostic imaging of breast: Secondary | ICD-10-CM

## 2020-09-13 DIAGNOSIS — R928 Other abnormal and inconclusive findings on diagnostic imaging of breast: Secondary | ICD-10-CM

## 2020-09-20 ENCOUNTER — Ambulatory Visit
Admission: RE | Admit: 2020-09-20 | Discharge: 2020-09-20 | Disposition: A | Discharge status: Home / Self Care | Payer: Medicare Other | Source: Ambulatory Visit | Attending: Family | Admitting: Family

## 2020-09-20 ENCOUNTER — Other Ambulatory Visit (HOSPITAL_COMMUNITY): Payer: Self-pay | Admitting: Diagnostic Radiology

## 2020-09-20 ENCOUNTER — Other Ambulatory Visit: Payer: Self-pay

## 2020-09-20 DIAGNOSIS — R921 Mammographic calcification found on diagnostic imaging of breast: Secondary | ICD-10-CM

## 2020-09-20 DIAGNOSIS — D0511 Intraductal carcinoma in situ of right breast: Secondary | ICD-10-CM | POA: Diagnosis not present

## 2020-09-22 HISTORY — PX: BREAST BIOPSY: SHX20

## 2020-09-23 ENCOUNTER — Other Ambulatory Visit: Payer: Self-pay

## 2020-09-23 ENCOUNTER — Encounter: Payer: Self-pay | Admitting: Family

## 2020-09-23 ENCOUNTER — Ambulatory Visit (INDEPENDENT_AMBULATORY_CARE_PROVIDER_SITE_OTHER): Payer: Medicare Other | Admitting: Family

## 2020-09-23 VITALS — BP 124/72 | HR 63 | Temp 97.2°F | Ht 64.0 in | Wt 194.8 lb

## 2020-09-23 DIAGNOSIS — I10 Essential (primary) hypertension: Secondary | ICD-10-CM

## 2020-09-23 DIAGNOSIS — G2581 Restless legs syndrome: Secondary | ICD-10-CM | POA: Diagnosis not present

## 2020-09-23 DIAGNOSIS — M8949 Other hypertrophic osteoarthropathy, multiple sites: Secondary | ICD-10-CM | POA: Diagnosis not present

## 2020-09-23 DIAGNOSIS — M7062 Trochanteric bursitis, left hip: Secondary | ICD-10-CM | POA: Diagnosis not present

## 2020-09-23 DIAGNOSIS — M7061 Trochanteric bursitis, right hip: Secondary | ICD-10-CM | POA: Diagnosis not present

## 2020-09-23 DIAGNOSIS — Z23 Encounter for immunization: Secondary | ICD-10-CM

## 2020-09-23 DIAGNOSIS — D508 Other iron deficiency anemias: Secondary | ICD-10-CM

## 2020-09-23 DIAGNOSIS — K219 Gastro-esophageal reflux disease without esophagitis: Secondary | ICD-10-CM

## 2020-09-23 DIAGNOSIS — G4733 Obstructive sleep apnea (adult) (pediatric): Secondary | ICD-10-CM | POA: Diagnosis not present

## 2020-09-23 DIAGNOSIS — M159 Polyosteoarthritis, unspecified: Secondary | ICD-10-CM

## 2020-09-23 DIAGNOSIS — M545 Low back pain, unspecified: Secondary | ICD-10-CM | POA: Diagnosis not present

## 2020-09-23 DIAGNOSIS — C50911 Malignant neoplasm of unspecified site of right female breast: Secondary | ICD-10-CM | POA: Diagnosis not present

## 2020-09-23 DIAGNOSIS — E785 Hyperlipidemia, unspecified: Secondary | ICD-10-CM | POA: Diagnosis not present

## 2020-09-23 MED ORDER — FLUTICASONE PROPIONATE 50 MCG/ACT NA SUSP: 2.0000 | Freq: Every day | NASAL | 6 refills | Status: DC

## 2020-09-23 MED ORDER — ATENOLOL 25 MG PO TABS: 12.5000 mg | ORAL_TABLET | Freq: Every day | ORAL | 5 refills | Status: DC

## 2020-09-23 MED ORDER — ATORVASTATIN CALCIUM 10 MG PO TABS: 10.0000 mg | ORAL_TABLET | Freq: Every day | ORAL | 0 refills | Status: DC

## 2020-09-23 MED ORDER — HYDROCHLOROTHIAZIDE 25 MG PO TABS: 25.0000 mg | ORAL_TABLET | Freq: Every day | ORAL | 1 refills | Status: DC

## 2020-09-23 MED ORDER — ALBUTEROL SULFATE HFA 108 (90 BASE) MCG/ACT IN AERS: INHALATION_SPRAY | RESPIRATORY_TRACT | 2 refills | Status: DC

## 2020-09-23 MED ORDER — MELOXICAM 7.5 MG PO TABS: 7.5000 mg | ORAL_TABLET | Freq: Every day | ORAL | 5 refills | Status: DC

## 2020-09-23 MED ORDER — IRON (FERROUS SULFATE) 325 (65 FE) MG PO TABS: 325.0000 mg | ORAL_TABLET | Freq: Two times a day (BID) | ORAL | 5 refills | Status: DC

## 2020-09-23 MED ORDER — OMEPRAZOLE 20 MG PO CPDR: 20.0000 mg | DELAYED_RELEASE_CAPSULE | Freq: Every day | ORAL | 3 refills | Status: DC

## 2020-09-23 NOTE — Patient Instructions (Signed)
Gastroesophageal Reflux Disease, Adult ?Gastroesophageal reflux (GER) happens when acid from the stomach flows up into the tube that connects the mouth and the stomach (esophagus). Normally, food travels down the esophagus and stays in the stomach to be digested. However, when a person has GER, food and stomach acid sometimes move back up into the esophagus. If this becomes a more serious problem, the person may be diagnosed with a disease called gastroesophageal reflux disease (GERD). GERD occurs when the reflux: ?Happens often. ?Causes frequent or severe symptoms. ?Causes problems such as damage to the esophagus. ?When stomach acid comes in contact with the esophagus, the acid may cause inflammation in the esophagus. Over time, GERD may create small holes (ulcers) in the lining of the esophagus. ?What are the causes? ?This condition is caused by a problem with the muscle between the esophagus and the stomach (lower esophageal sphincter, or LES). Normally, the LES muscle closes after food passes through the esophagus to the stomach. When the LES is weakened or abnormal, it does not close properly, and that allows food and stomach acid to go back up into the esophagus. ?The LES can be weakened by certain dietary substances, medicines, and medical conditions, including: ?Tobacco use. ?Pregnancy. ?Having a hiatal hernia. ?Alcohol use. ?Certain foods and beverages, such as coffee, chocolate, onions, and peppermint. ?What increases the risk? ?You are more likely to develop this condition if you: ?Have an increased body weight. ?Have a connective tissue disorder. ?Take NSAIDs, such as ibuprofen. ?What are the signs or symptoms? ?Symptoms of this condition include: ?Heartburn. ?Difficult or painful swallowing and the feeling of having a lump in the throat. ?A bitter taste in the mouth. ?Bad breath and having a large amount of saliva. ?Having an upset or bloated stomach and belching. ?Chest pain. Different conditions can  cause chest pain. Make sure you see your health care provider if you experience chest pain. ?Shortness of breath or wheezing. ?Ongoing (chronic) cough or a nighttime cough. ?Wearing away of tooth enamel. ?Weight loss. ?How is this diagnosed? ?This condition may be diagnosed based on a medical history and a physical exam. To determine if you have mild or severe GERD, your health care provider may also monitor how you respond to treatment. You may also have tests, including: ?A test to examine your stomach and esophagus with a small camera (endoscopy). ?A test that measures the acidity level in your esophagus. ?A test that measures how much pressure is on your esophagus. ?A barium swallow or modified barium swallow test to show the shape, size, and functioning of your esophagus. ?How is this treated? ?Treatment for this condition may vary depending on how severe your symptoms are. Your health care provider may recommend: ?Changes to your diet. ?Medicine. ?Surgery. ?The goal of treatment is to help relieve your symptoms and to prevent complications. ?Follow these instructions at home: ?Eating and drinking ? ?Follow a diet as recommended by your health care provider. This may involve avoiding foods and drinks such as: ?Coffee and tea, with or without caffeine. ?Drinks that contain alcohol. ?Energy drinks and sports drinks. ?Carbonated drinks or sodas. ?Chocolate and cocoa. ?Peppermint and mint flavorings. ?Garlic and onions. ?Horseradish. ?Spicy and acidic foods, including peppers, chili powder, curry powder, vinegar, hot sauces, and barbecue sauce. ?Citrus fruit juices and citrus fruits, such as oranges, lemons, and limes. ?Tomato-based foods, such as red sauce, chili, salsa, and pizza with red sauce. ?Fried and fatty foods, such as donuts, french fries, potato chips, and high-fat dressings. ?  High-fat meats, such as hot dogs and fatty cuts of red and white meats, such as rib eye steak, sausage, ham, and  bacon. ?High-fat dairy items, such as whole milk, butter, and cream cheese. ?Eat small, frequent meals instead of large meals. ?Avoid drinking large amounts of liquid with your meals. ?Avoid eating meals during the 2-3 hours before bedtime. ?Avoid lying down right after you eat. ?Do not exercise right after you eat. ?Lifestyle ? ?Do not use any products that contain nicotine or tobacco. These products include cigarettes, chewing tobacco, and vaping devices, such as e-cigarettes. If you need help quitting, ask your health care provider. ?Try to reduce your stress by using methods such as yoga or meditation. If you need help reducing stress, ask your health care provider. ?If you are overweight, reduce your weight to an amount that is healthy for you. Ask your health care provider for guidance about a safe weight loss goal. ?General instructions ?Pay attention to any changes in your symptoms. ?Take over-the-counter and prescription medicines only as told by your health care provider. Do not take aspirin, ibuprofen, or other NSAIDs unless your health care provider told you to take these medicines. ?Wear loose-fitting clothing. Do not wear anything tight around your waist that causes pressure on your abdomen. ?Raise (elevate) the head of your bed about 6 inches (15 cm). You can use a wedge to do this. ?Avoid bending over if this makes your symptoms worse. ?Keep all follow-up visits. This is important. ?Contact a health care provider if: ?You have: ?New symptoms. ?Unexplained weight loss. ?Difficulty swallowing or it hurts to swallow. ?Wheezing or a persistent cough. ?A hoarse voice. ?Your symptoms do not improve with treatment. ?Get help right away if: ?You have sudden pain in your arms, neck, jaw, teeth, or back. ?You suddenly feel sweaty, dizzy, or light-headed. ?You have chest pain or shortness of breath. ?You vomit and the vomit is green, yellow, or black, or it looks like blood or coffee grounds. ?You faint. ?You  have stool that is red, bloody, or black. ?You cannot swallow, drink, or eat. ?These symptoms may represent a serious problem that is an emergency. Do not wait to see if the symptoms will go away. Get medical help right away. Call your local emergency services (911 in the [place] Do not drive yourself to the hospital. ?Summary ?Gastroesophageal reflux happens when acid from the stomach flows up into the esophagus. GERD is a disease in which the reflux happens often, causes frequent or severe symptoms, or causes problems such as damage to the esophagus. ?Treatment for this condition may vary depending on how severe your symptoms are. Your health care provider may recommend diet and lifestyle changes, medicine, or surgery. ?Contact a health care provider if you have new or worsening symptoms. ?Take over-the-counter and prescription medicines only as told by your health care provider. Do not take aspirin, ibuprofen, or other NSAIDs unless your health care provider told you to do so. ?Keep all follow-up visits as told by your health care provider. This is important. ?This information is not intended to replace advice given to you by your health care provider. Make sure you discuss any questions you have with your health care provider. ?Document Revised: 07/21/2019 Document Reviewed: 07/21/2019 ?Elsevier Patient Education ? 2022 Elsevier Inc. ? ?

## 2020-09-23 NOTE — Progress Notes (Signed)
Subjective:    Patient ID: Laura James, female    DOB: Aug 24, 1950, 70 y.o.   MRN: [id number]  Chief Complaint  Patient presents with   Medical Management of Chronic Issues    Dx of breast cancer in [place] breast meets with surgeon tomorrow    Pt presents to the office today for chronic follow up.  PT is followed by Ortho for back pain and bursitis of hip.  She had a positive mammogram and has been diagnosed with right breast cancer. She has an appointment with surgeon tomorrow.  Gastroesophageal Reflux She complains of belching and heartburn. This is a chronic problem. The current episode started more than 1 year ago. The problem occurs frequently. The problem has been waxing and waning. Risk factors include obesity. She has tried a diet change for the symptoms.  Hypertension This is a chronic problem. The current episode started more than 1 year ago. The problem has been resolved since onset. The problem is controlled. Associated symptoms include peripheral edema (Slight). Pertinent negatives include no malaise/fatigue or shortness of breath. Risk factors for coronary artery disease include dyslipidemia, obesity and sedentary lifestyle. The current treatment provides moderate improvement. There is no history of CVA or heart failure.  Hyperlipidemia This is a chronic problem. The current episode started more than 1 year ago. The problem is controlled. Recent lipid tests were reviewed and are normal. Exacerbating diseases include obesity. Pertinent negatives include no shortness of breath. Current antihyperlipidemic treatment includes statins. The current treatment provides moderate improvement of lipids. Risk factors for coronary artery disease include dyslipidemia, hypertension and a sedentary lifestyle.  Arthritis Presents for follow-up visit. She complains of pain and stiffness. The symptoms have been stable. Affected locations include the left knee, right knee, left MCP and right MCP. Her pain  is at a severity of 8/10.  OSA Using CPAP some times.    Review of Systems  Constitutional:  Negative for malaise/fatigue.  Respiratory:  Negative for shortness of breath.   Gastrointestinal:  Positive for heartburn.  Musculoskeletal:  Positive for arthritis and stiffness.  All other systems reviewed and are negative.     Objective:   Physical Exam Vitals reviewed.  Constitutional:      General: She is not in acute distress.    Appearance: She is well-developed.  HENT:     Head: Normocephalic and atraumatic.     Right Ear: Tympanic membrane normal.     Left Ear: Tympanic membrane normal.  Eyes:     Pupils: Pupils are equal, round, and reactive to light.  Neck:     Thyroid: No thyromegaly.  Cardiovascular:     Rate and Rhythm: Normal rate and regular rhythm.     Heart sounds: Normal heart sounds. No murmur heard. Pulmonary:     Effort: Pulmonary effort is normal. No respiratory distress.     Breath sounds: Normal breath sounds. No wheezing.  Abdominal:     General: Bowel sounds are normal. There is no distension.     Palpations: Abdomen is soft.     Tenderness: There is no abdominal tenderness.  Musculoskeletal:        General: No tenderness. Normal range of motion.     Cervical back: Normal range of motion and neck supple.  Skin:    General: Skin is warm and dry.  Neurological:     Mental Status: She is alert and oriented to person, place, and time.     Cranial Nerves: No cranial nerve  deficit.     Deep Tendon Reflexes: Reflexes are normal and symmetric.  Psychiatric:        Behavior: Behavior normal.        Thought Content: Thought content normal.        Judgment: Judgment normal.         BP 124/72   Pulse 63   Temp (!) 97.2 F (36.2 C) (Temporal)   Ht 5' 4"  (1.626 m)   Wt 194 lb 12.8 oz (88.4 kg)   SpO2 98%   BMI 33.44 kg/m   Assessment & Plan:  Laura James comes in today with chief complaint of Medical Management of Chronic Issues (Dx of breast  cancer in [place] breast meets with surgeon tomorrow )   Diagnosis and orders addressed:  1. Essential hypertension, benign - atenolol (TENORMIN) 25 MG tablet; Take 0.5 tablets (12.5 mg total) by mouth daily. (Needs to be seen before next refill)  Dispense: 30 tablet; Refill: 5 - hydrochlorothiazide (HYDRODIURIL) 25 MG tablet; Take 1 tablet (25 mg total) by mouth daily.  Dispense: 90 tablet; Refill: 1 - CMP14+EGFR - CBC with Differential/Platelet  2. Lumbar pain - meloxicam (MOBIC) 7.5 MG tablet; Take 1 tablet (7.5 mg total) by mouth daily.  Dispense: 30 tablet; Refill: 5 - CMP14+EGFR - CBC with Differential/Platelet  3. Greater trochanteric bursitis of both hips - meloxicam (MOBIC) 7.5 MG tablet; Take 1 tablet (7.5 mg total) by mouth daily.  Dispense: 30 tablet; Refill: 5 - CMP14+EGFR - CBC with Differential/Platelet  4. Hyperlipidemia, unspecified hyperlipidemia type - atorvastatin (LIPITOR) 10 MG tablet; Take 1 tablet (10 mg total) by mouth daily.  Dispense: 90 tablet; Refill: 0 - CMP14+EGFR - CBC with Differential/Platelet  5. Other iron deficiency anemia - Iron, Ferrous Sulfate, 325 (65 Fe) MG TABS; Take 325 mg by mouth 2 (two) times daily with a meal.  Dispense: 180 tablet; Refill: 5 - CMP14+EGFR - CBC with Differential/Platelet  6. Obstructive sleep apnea - CMP14+EGFR - CBC with Differential/Platelet  7. Gastroesophageal reflux disease without esophagitis - CMP14+EGFR - CBC with Differential/Platelet - omeprazole (PRILOSEC) 20 MG capsule; Take 1 capsule (20 mg total) by mouth daily.  Dispense: 30 capsule; Refill: 3  8. Primary osteoarthritis involving multiple joints - CMP14+EGFR - CBC with Differential/Platelet  9. RLS (restless legs syndrome) - CMP14+EGFR - CBC with Differential/Platelet  10. Malignant neoplasm of right female breast, unspecified estrogen receptor status, unspecified site of breast ([place]) - CMP14+EGFR - CBC with Differential/Platelet   Labs  pending Health Maintenance reviewed Diet and exercise encouraged  Follow up plan: 6 months    Evelina Dun, FNP

## 2020-09-24 DIAGNOSIS — D0511 Intraductal carcinoma in situ of right breast: Secondary | ICD-10-CM | POA: Diagnosis not present

## 2020-09-24 LAB — CBC WITH DIFFERENTIAL/PLATELET
Basophils Absolute: 0 10*3/uL (ref 0.0–0.2)
Basos: 1 %
EOS (ABSOLUTE): 0.5 10*3/uL — ABNORMAL HIGH (ref 0.0–0.4)
Eos: 6 %
Hematocrit: 42.2 % (ref 34.0–46.6)
Hemoglobin: 13.7 g/dL (ref 11.1–15.9)
Immature Grans (Abs): 0 10*3/uL (ref 0.0–0.1)
Immature Granulocytes: 0 %
Lymphocytes Absolute: 2.7 10*3/uL (ref 0.7–3.1)
Lymphs: 34 %
MCH: 28 pg (ref 26.6–33.0)
MCHC: 32.5 g/dL (ref 31.5–35.7)
MCV: 86 fL (ref 79–97)
Monocytes Absolute: 0.6 10*3/uL (ref 0.1–0.9)
Monocytes: 8 %
Neutrophils Absolute: 4.2 10*3/uL (ref 1.4–7.0)
Neutrophils: 51 %
Platelets: 306 10*3/uL (ref 150–450)
RBC: 4.89 x10E6/uL (ref 3.77–5.28)
RDW: 13.4 % (ref 11.7–15.4)
WBC: 8 10*3/uL (ref 3.4–10.8)

## 2020-09-24 LAB — CMP14+EGFR
ALT: 16 IU/L (ref 0–32)
AST: 20 IU/L (ref 0–40)
Albumin/Globulin Ratio: 1.7 (ref 1.2–2.2)
Albumin: 4.1 g/dL (ref 3.7–4.7)
Alkaline Phosphatase: 82 IU/L (ref 44–121)
BUN/Creatinine Ratio: 15 (ref 12–28)
BUN: 15 mg/dL (ref 8–27)
Bilirubin Total: <0.2 mg/dL (ref 0.0–1.2)
CO2: 26 mmol/L (ref 20–29)
Calcium: 9.6 mg/dL (ref 8.7–10.3)
Chloride: 104 mmol/L (ref 96–106)
Creatinine, Ser: 0.99 mg/dL (ref 0.57–1.00)
Globulin, Total: 2.4 g/dL (ref 1.5–4.5)
Glucose: 83 mg/dL (ref 65–99)
Potassium: 3.8 mmol/L (ref 3.5–5.2)
Sodium: 142 mmol/L (ref 134–144)
Total Protein: 6.5 g/dL (ref 6.0–8.5)
eGFR: 61 mL/min/{1.73_m2} (ref >59)

## 2020-10-01 ENCOUNTER — Other Ambulatory Visit: Payer: Self-pay | Admitting: General Surgery

## 2020-10-01 DIAGNOSIS — D0511 Intraductal carcinoma in situ of right breast: Secondary | ICD-10-CM

## 2020-10-04 ENCOUNTER — Encounter (HOSPITAL_BASED_OUTPATIENT_CLINIC_OR_DEPARTMENT_OTHER): Payer: Self-pay | Admitting: General Surgery

## 2020-10-04 ENCOUNTER — Other Ambulatory Visit: Payer: Self-pay

## 2020-10-05 ENCOUNTER — Other Ambulatory Visit: Payer: Self-pay | Admitting: General Surgery

## 2020-10-05 DIAGNOSIS — D0511 Intraductal carcinoma in situ of right breast: Secondary | ICD-10-CM

## 2020-10-07 ENCOUNTER — Encounter (HOSPITAL_BASED_OUTPATIENT_CLINIC_OR_DEPARTMENT_OTHER)
Admission: RE | Admit: 2020-10-07 | Discharge: 2020-10-07 | Disposition: A | Discharge status: Home / Self Care | Payer: Medicare Other | Source: Ambulatory Visit | Attending: General Surgery | Admitting: General Surgery

## 2020-10-07 DIAGNOSIS — Z01812 Encounter for preprocedural laboratory examination: Secondary | ICD-10-CM | POA: Insufficient documentation

## 2020-10-07 LAB — BASIC METABOLIC PANEL
Anion gap: 6 (ref 5–15)
BUN: 13 mg/dL (ref 8–23)
CO2: 28 mmol/L (ref 22–32)
Calcium: 9.4 mg/dL (ref 8.9–10.3)
Chloride: 103 mmol/L (ref 98–111)
Creatinine, Ser: 0.97 mg/dL (ref 0.44–1.00)
GFR, Estimated: >60 mL/min (ref >60)
Glucose, Bld: 116 mg/dL — ABNORMAL HIGH (ref 70–99)
Potassium: 3.8 mmol/L (ref 3.5–5.1)
Sodium: 137 mmol/L (ref 135–145)

## 2020-10-07 MED ORDER — ENSURE PRE-SURGERY PO LIQD: 296.0000 mL | Freq: Once | ORAL | Status: DC

## 2020-10-07 NOTE — Progress Notes (Signed)

## 2020-10-12 ENCOUNTER — Other Ambulatory Visit: Payer: Self-pay

## 2020-10-12 ENCOUNTER — Ambulatory Visit
Admission: RE | Admit: 2020-10-12 | Discharge: 2020-10-12 | Disposition: A | Discharge status: Home / Self Care | Payer: Medicare Other | Source: Ambulatory Visit | Attending: General Surgery | Admitting: General Surgery

## 2020-10-12 DIAGNOSIS — D0511 Intraductal carcinoma in situ of right breast: Secondary | ICD-10-CM

## 2020-10-12 DIAGNOSIS — C50911 Malignant neoplasm of unspecified site of right female breast: Secondary | ICD-10-CM | POA: Diagnosis not present

## 2020-10-12 NOTE — Anesthesia Preprocedure Evaluation (Addendum)
Anesthesia Evaluation  Patient identified by MRN, date of birth, ID band Patient awake    Reviewed: Allergy & Precautions, H&P , NPO status , Patient's Chart, lab work & pertinent test results  Airway Mallampati: II  TM Distance: >3 FB Neck ROM: Full    Dental no notable dental hx. (+) Teeth Intact, Dental Advisory Given   Pulmonary neg pulmonary ROS, sleep apnea , former smoker,    Pulmonary exam normal breath sounds clear to auscultation       Cardiovascular Exercise Tolerance: Good hypertension, Pt. on medications and Pt. on home beta blockers negative cardio ROS Normal cardiovascular exam Rhythm:Regular Rate:Normal     Neuro/Psych Depression negative neurological ROS  negative psych ROS   GI/Hepatic negative GI ROS, Neg liver ROS, hiatal hernia, GERD  Medicated and Controlled,  Endo/Other  negative endocrine ROSHypothyroidism   Renal/GU negative Renal ROS  negative genitourinary   Musculoskeletal negative musculoskeletal ROS (+) Arthritis , Osteoarthritis,    Abdominal   Peds negative pediatric ROS (+)  Hematology negative hematology ROS (+)   Anesthesia Other Findings   Reproductive/Obstetrics negative OB ROS                            Anesthesia Physical Anesthesia Plan  ASA: 3  Anesthesia Plan: General   Post-op Pain Management:    Induction:   PONV Risk Score and Plan: 3 and Ondansetron, Dexamethasone and Treatment may vary due to age or medical condition  Airway Management Planned: Oral ETT and LMA  Additional Equipment:   Intra-op Plan:   Post-operative Plan:   Informed Consent: I have reviewed the patients History and Physical, chart, labs and discussed the procedure including the risks, benefits and alternatives for the proposed anesthesia with the patient or authorized representative who has indicated his/her understanding and acceptance.       Plan  Discussed with:   Anesthesia Plan Comments:         Anesthesia Quick Evaluation

## 2020-10-13 ENCOUNTER — Encounter (HOSPITAL_BASED_OUTPATIENT_CLINIC_OR_DEPARTMENT_OTHER)
Admission: RE | Disposition: A | Discharge status: Home / Self Care | Payer: Self-pay | Source: Home / Self Care | Attending: General Surgery

## 2020-10-13 ENCOUNTER — Ambulatory Visit (HOSPITAL_BASED_OUTPATIENT_CLINIC_OR_DEPARTMENT_OTHER): Payer: Medicare Other | Admitting: Anesthesiology

## 2020-10-13 ENCOUNTER — Other Ambulatory Visit: Payer: Self-pay

## 2020-10-13 ENCOUNTER — Ambulatory Visit
Admission: RE | Admit: 2020-10-13 | Discharge: 2020-10-13 | Disposition: A | Discharge status: Home / Self Care | Payer: Medicare Other | Source: Ambulatory Visit | Attending: General Surgery | Admitting: General Surgery

## 2020-10-13 ENCOUNTER — Ambulatory Visit (HOSPITAL_BASED_OUTPATIENT_CLINIC_OR_DEPARTMENT_OTHER)
Admission: RE | Admit: 2020-10-13 | Discharge: 2020-10-13 | Disposition: A | Discharge status: Home / Self Care | Payer: Medicare Other | Attending: General Surgery | Admitting: General Surgery

## 2020-10-13 ENCOUNTER — Encounter (HOSPITAL_BASED_OUTPATIENT_CLINIC_OR_DEPARTMENT_OTHER): Payer: Self-pay | Admitting: General Surgery

## 2020-10-13 DIAGNOSIS — Z791 Long term (current) use of non-steroidal anti-inflammatories (NSAID): Secondary | ICD-10-CM | POA: Diagnosis not present

## 2020-10-13 DIAGNOSIS — Z91040 Latex allergy status: Secondary | ICD-10-CM | POA: Insufficient documentation

## 2020-10-13 DIAGNOSIS — Z79899 Other long term (current) drug therapy: Secondary | ICD-10-CM | POA: Insufficient documentation

## 2020-10-13 DIAGNOSIS — N6489 Other specified disorders of breast: Secondary | ICD-10-CM | POA: Diagnosis not present

## 2020-10-13 DIAGNOSIS — I10 Essential (primary) hypertension: Secondary | ICD-10-CM | POA: Diagnosis not present

## 2020-10-13 DIAGNOSIS — D0511 Intraductal carcinoma in situ of right breast: Secondary | ICD-10-CM

## 2020-10-13 DIAGNOSIS — N62 Hypertrophy of breast: Secondary | ICD-10-CM | POA: Diagnosis not present

## 2020-10-13 DIAGNOSIS — E7800 Pure hypercholesterolemia, unspecified: Secondary | ICD-10-CM | POA: Diagnosis not present

## 2020-10-13 DIAGNOSIS — Z91048 Other nonmedicinal substance allergy status: Secondary | ICD-10-CM | POA: Insufficient documentation

## 2020-10-13 DIAGNOSIS — G4733 Obstructive sleep apnea (adult) (pediatric): Secondary | ICD-10-CM | POA: Diagnosis not present

## 2020-10-13 DIAGNOSIS — R928 Other abnormal and inconclusive findings on diagnostic imaging of breast: Secondary | ICD-10-CM | POA: Diagnosis not present

## 2020-10-13 DIAGNOSIS — E785 Hyperlipidemia, unspecified: Secondary | ICD-10-CM | POA: Diagnosis not present

## 2020-10-13 DIAGNOSIS — Z87891 Personal history of nicotine dependence: Secondary | ICD-10-CM | POA: Diagnosis not present

## 2020-10-13 DIAGNOSIS — Z170 Estrogen receptor positive status [ER+]: Secondary | ICD-10-CM | POA: Insufficient documentation

## 2020-10-13 DIAGNOSIS — C50919 Malignant neoplasm of unspecified site of unspecified female breast: Secondary | ICD-10-CM

## 2020-10-13 HISTORY — PX: BREAST LUMPECTOMY: SHX2

## 2020-10-13 HISTORY — DX: Unspecified chronic bronchitis: J42

## 2020-10-13 HISTORY — PX: BREAST LUMPECTOMY WITH RADIOACTIVE SEED LOCALIZATION: SHX6424

## 2020-10-13 HISTORY — DX: Malignant neoplasm of unspecified site of unspecified female breast: C50.919

## 2020-10-13 SURGERY — BREAST LUMPECTOMY WITH RADIOACTIVE SEED LOCALIZATION
Anesthesia: General | Site: Breast | Laterality: Right

## 2020-10-13 MED ORDER — MIDAZOLAM HCL 5 MG/5ML IJ SOLN
INTRAMUSCULAR | Status: DC | PRN
  Administered 2020-10-13: 1 mg via INTRAVENOUS

## 2020-10-13 MED ORDER — OXYCODONE HCL 5 MG/5ML PO SOLN: 5.0000 mg | Freq: Once | ORAL | Status: DC | PRN

## 2020-10-13 MED ORDER — BUPIVACAINE HCL (PF) 0.25 % IJ SOLN
INTRAMUSCULAR | Status: DC | PRN
  Administered 2020-10-13: 10 mL

## 2020-10-13 MED ORDER — ONDANSETRON HCL 4 MG/2ML IJ SOLN: 4.0000 mg | Freq: Once | INTRAMUSCULAR | Status: DC | PRN

## 2020-10-13 MED ORDER — FENTANYL CITRATE (PF) 100 MCG/2ML IJ SOLN
25.0000 ug | INTRAMUSCULAR | Status: DC | PRN
  Administered 2020-10-13: 50 ug via INTRAVENOUS
  Administered 2020-10-13: 25 ug via INTRAVENOUS

## 2020-10-13 MED ORDER — ATROPINE SULFATE 0.4 MG/ML IJ SOLN
INTRAMUSCULAR | Status: AC
  Filled 2020-10-13: qty 1

## 2020-10-13 MED ORDER — SUCCINYLCHOLINE CHLORIDE 200 MG/10ML IV SOSY
PREFILLED_SYRINGE | INTRAVENOUS | Status: AC
  Filled 2020-10-13: qty 10

## 2020-10-13 MED ORDER — LIDOCAINE HCL (CARDIAC) PF 100 MG/5ML IV SOSY
PREFILLED_SYRINGE | INTRAVENOUS | Status: DC | PRN
  Administered 2020-10-13: 60 mg via INTRAVENOUS

## 2020-10-13 MED ORDER — DEXAMETHASONE SODIUM PHOSPHATE 4 MG/ML IJ SOLN
INTRAMUSCULAR | Status: DC | PRN
  Administered 2020-10-13: 10 mg via INTRAVENOUS

## 2020-10-13 MED ORDER — ONDANSETRON HCL 4 MG/2ML IJ SOLN
INTRAMUSCULAR | Status: AC
  Filled 2020-10-13: qty 2

## 2020-10-13 MED ORDER — LIDOCAINE HCL (PF) 2 % IJ SOLN
INTRAMUSCULAR | Status: AC
  Filled 2020-10-13: qty 5

## 2020-10-13 MED ORDER — ACETAMINOPHEN 160 MG/5ML PO SOLN: 325.0000 mg | ORAL | Status: DC | PRN

## 2020-10-13 MED ORDER — ACETAMINOPHEN 500 MG PO TABS
1000.0000 mg | ORAL_TABLET | ORAL | Status: AC
  Administered 2020-10-13: 1000 mg via ORAL

## 2020-10-13 MED ORDER — PHENYLEPHRINE 40 MCG/ML (10ML) SYRINGE FOR IV PUSH (FOR BLOOD PRESSURE SUPPORT)
PREFILLED_SYRINGE | INTRAVENOUS | Status: AC
  Filled 2020-10-13: qty 10

## 2020-10-13 MED ORDER — ACETAMINOPHEN 325 MG PO TABS: 325.0000 mg | ORAL_TABLET | ORAL | Status: DC | PRN

## 2020-10-13 MED ORDER — CEFAZOLIN SODIUM-DEXTROSE 2-4 GM/100ML-% IV SOLN
2.0000 g | INTRAVENOUS | Status: AC
  Administered 2020-10-13: 2 g via INTRAVENOUS

## 2020-10-13 MED ORDER — CHLORHEXIDINE GLUCONATE CLOTH 2 % EX PADS: 6.0000 | MEDICATED_PAD | Freq: Once | CUTANEOUS | Status: DC

## 2020-10-13 MED ORDER — MEPERIDINE HCL 25 MG/ML IJ SOLN: 6.2500 mg | INTRAMUSCULAR | Status: DC | PRN

## 2020-10-13 MED ORDER — ACETAMINOPHEN 500 MG PO TABS
ORAL_TABLET | ORAL | Status: AC
  Filled 2020-10-13: qty 2

## 2020-10-13 MED ORDER — FENTANYL CITRATE (PF) 100 MCG/2ML IJ SOLN
INTRAMUSCULAR | Status: DC | PRN
  Administered 2020-10-13: 50 ug via INTRAVENOUS

## 2020-10-13 MED ORDER — EPHEDRINE SULFATE 50 MG/ML IJ SOLN
INTRAMUSCULAR | Status: DC | PRN
  Administered 2020-10-13: 10 mg via INTRAVENOUS

## 2020-10-13 MED ORDER — LACTATED RINGERS IV SOLN: INTRAVENOUS | Status: DC

## 2020-10-13 MED ORDER — DEXAMETHASONE SODIUM PHOSPHATE 10 MG/ML IJ SOLN
INTRAMUSCULAR | Status: AC
  Filled 2020-10-13: qty 1

## 2020-10-13 MED ORDER — ONDANSETRON HCL 4 MG/2ML IJ SOLN
INTRAMUSCULAR | Status: DC | PRN
  Administered 2020-10-13: 4 mg via INTRAVENOUS

## 2020-10-13 MED ORDER — CEFAZOLIN SODIUM-DEXTROSE 2-4 GM/100ML-% IV SOLN
INTRAVENOUS | Status: AC
  Filled 2020-10-13: qty 100

## 2020-10-13 MED ORDER — FENTANYL CITRATE (PF) 100 MCG/2ML IJ SOLN
INTRAMUSCULAR | Status: AC
  Filled 2020-10-13: qty 2

## 2020-10-13 MED ORDER — EPHEDRINE 5 MG/ML INJ
INTRAVENOUS | Status: AC
  Filled 2020-10-13: qty 5

## 2020-10-13 MED ORDER — DIPHENHYDRAMINE HCL 50 MG/ML IJ SOLN
INTRAMUSCULAR | Status: DC | PRN
  Administered 2020-10-13: 6.25 mg via INTRAVENOUS

## 2020-10-13 MED ORDER — BUPIVACAINE HCL (PF) 0.25 % IJ SOLN
INTRAMUSCULAR | Status: AC
  Filled 2020-10-13: qty 30

## 2020-10-13 MED ORDER — MIDAZOLAM HCL 2 MG/2ML IJ SOLN
INTRAMUSCULAR | Status: AC
  Filled 2020-10-13: qty 2

## 2020-10-13 MED ORDER — PROPOFOL 10 MG/ML IV BOLUS
INTRAVENOUS | Status: DC | PRN
  Administered 2020-10-13: 150 mg via INTRAVENOUS

## 2020-10-13 MED ORDER — PROPOFOL 500 MG/50ML IV EMUL
INTRAVENOUS | Status: AC
  Filled 2020-10-13: qty 50

## 2020-10-13 MED ORDER — OXYCODONE HCL 5 MG PO TABS: 5.0000 mg | ORAL_TABLET | Freq: Once | ORAL | Status: DC | PRN

## 2020-10-13 SURGICAL SUPPLY — 40 items
ADH SKN CLS APL DERMABOND .7 (GAUZE/BANDAGES/DRESSINGS) ×1
APL PRP STRL LF DISP 70% ISPRP (MISCELLANEOUS) ×1
BINDER BREAST XLRG (GAUZE/BANDAGES/DRESSINGS) IMPLANT
BINDER BREAST XXLRG (GAUZE/BANDAGES/DRESSINGS) ×1 IMPLANT
BLADE SURG 15 STRL LF DISP TIS (BLADE) ×1 IMPLANT
BLADE SURG 15 STRL SS (BLADE) ×2
CHLORAPREP W/TINT 26 (MISCELLANEOUS) ×2 IMPLANT
COVER BACK TABLE 60X90IN (DRAPES) ×2 IMPLANT
COVER MAYO STAND STRL (DRAPES) ×2 IMPLANT
COVER PROBE W GEL 5X96 (DRAPES) ×2 IMPLANT
DERMABOND ADVANCED (GAUZE/BANDAGES/DRESSINGS) ×1
DERMABOND ADVANCED .7 DNX12 (GAUZE/BANDAGES/DRESSINGS) ×1 IMPLANT
DRAPE LAPAROSCOPIC ABDOMINAL (DRAPES) ×2 IMPLANT
DRAPE UTILITY XL STRL (DRAPES) ×2 IMPLANT
DRSG TEGADERM 4X4.75 (GAUZE/BANDAGES/DRESSINGS) IMPLANT
ELECT COATED BLADE 2.86 ST (ELECTRODE) ×2 IMPLANT
ELECT REM PT RETURN 9FT ADLT (ELECTROSURGICAL) ×2
ELECTRODE REM PT RTRN 9FT ADLT (ELECTROSURGICAL) ×1 IMPLANT
GLOVE SURG POLYISO LF SZ7 (GLOVE) ×1 IMPLANT
GLOVE SURG UNDER POLY LF SZ7.5 (GLOVE) ×2 IMPLANT
GOWN STRL REUS W/ TWL LRG LVL3 (GOWN DISPOSABLE) ×2 IMPLANT
GOWN STRL REUS W/TWL LRG LVL3 (GOWN DISPOSABLE) ×4
KIT MARKER MARGIN INK (KITS) ×2 IMPLANT
NDL HYPO 25X1 1.5 SAFETY (NEEDLE) ×1 IMPLANT
NEEDLE HYPO 25X1 1.5 SAFETY (NEEDLE) ×2 IMPLANT
PACK BASIN DAY SURGERY FS (CUSTOM PROCEDURE TRAY) ×2 IMPLANT
PENCIL SMOKE EVACUATOR (MISCELLANEOUS) ×2 IMPLANT
SLEEVE SCD COMPRESS KNEE MED (STOCKING) ×2 IMPLANT
SPONGE T-LAP 4X18 ~~LOC~~+RFID (SPONGE) ×2 IMPLANT
STRIP CLOSURE SKIN 1/2X4 (GAUZE/BANDAGES/DRESSINGS) ×2 IMPLANT
SUT MNCRL AB 4-0 PS2 18 (SUTURE) ×2 IMPLANT
SUT MON AB 5-0 PS2 18 (SUTURE) IMPLANT
SUT SILK 2 0 SH (SUTURE) ×1 IMPLANT
SUT VIC AB 2-0 SH 27 (SUTURE) ×2
SUT VIC AB 2-0 SH 27XBRD (SUTURE) ×1 IMPLANT
SUT VIC AB 3-0 SH 27 (SUTURE) ×2
SUT VIC AB 3-0 SH 27X BRD (SUTURE) ×1 IMPLANT
SYR CONTROL 10ML LL (SYRINGE) ×2 IMPLANT
TOWEL GREEN STERILE FF (TOWEL DISPOSABLE) ×2 IMPLANT
TRAY FAXITRON CT DISP (TRAY / TRAY PROCEDURE) ×2 IMPLANT

## 2020-10-13 NOTE — Discharge Instructions (Addendum)
[place] Office Phone Number [phone]  BREAST BIOPSY/ PARTIAL MASTECTOMY: POST OP INSTRUCTIONS Take 400 mg of ibuprofen every 8 hours or 650 mg tylenol every 6 hours for next 72 hours then as needed. Use ice several times daily also. Always review your discharge instruction sheet given to you by the facility where your surgery was performed.  IF YOU HAVE DISABILITY OR FAMILY LEAVE FORMS, YOU MUST BRING THEM TO THE OFFICE FOR PROCESSING.  DO NOT GIVE THEM TO YOUR DOCTOR.  A prescription for pain medication may be given to you upon discharge.  Take your pain medication as prescribed, if needed.  If narcotic pain medicine is not needed, then you may take acetaminophen (Tylenol), naprosyn (Alleve) or ibuprofen (Advil) as needed. No Tylenol until after 1:00pm today. Take your usually prescribed medications unless otherwise directed If you need a refill on your pain medication, please contact your pharmacy.  They will contact our office to request authorization.  Prescriptions will not be filled after 5pm or on week-ends. You should eat very light the first 24 hours after surgery, such as soup, crackers, pudding, etc.  Resume your normal diet the day after surgery. Most patients will experience some swelling and bruising in the breast.  Ice packs and a good support bra will help.  Wear the breast binder provided or a sports bra for 72 hours day and night.  After that wear a sports bra during the day until you return to the office. Swelling and bruising can take several days to resolve.  It is common to experience some constipation if taking pain medication after surgery.  Increasing fluid intake and taking a stool softener will usually help or prevent this problem from occurring.  A mild laxative (Milk of Magnesia or Miralax) should be taken according to package directions if there are no bowel movements after 48 hours. Unless discharge instructions indicate otherwise, you may remove  your bandages 48 hours after surgery and you may shower at that time.  You may have steri-strips (small skin tapes) in place directly over the incision.  These strips should be left on the skin for 7-10 days and will come off on their own.  If your surgeon used skin glue on the incision, you may shower in 24 hours.  The glue will flake off over the next 2-3 weeks.  Any sutures or staples will be removed at the office during your follow-up visit. ACTIVITIES:  You may resume regular daily activities (gradually increasing) beginning the next day.  Wearing a good support bra or sports bra minimizes pain and swelling.  You may have sexual intercourse when it is comfortable. You may drive when you no longer are taking prescription pain medication, you can comfortably wear a seatbelt, and you can safely maneuver your car and apply brakes. RETURN TO WORK:  ______________________________________________________________________________________ Dennis Bast should see your doctor in the office for a follow-up appointment approximately two weeks after your surgery.  Your doctor's nurse will typically make your follow-up appointment when she calls you with your pathology report.  Expect your pathology report 3-4 business days after your surgery.  You may call to check if you do not hear from [place] after three days. OTHER INSTRUCTIONS: _______________________________________________________________________________________________ _____________________________________________________________________________________________________________________________________ _____________________________________________________________________________________________________________________________________ _____________________________________________________________________________________________________________________________________  WHEN TO CALL DR WAKEFIELD: Fever over 101.0 Nausea and/or vomiting. Extreme swelling or  bruising. Continued bleeding from incision. Increased pain, redness, or drainage from the incision.  The clinic staff is available to answer your questions during regular business hours.  Please don't hesitate to call and ask to speak to one of the nurses for clinical concerns.  If you have a medical emergency, go to the nearest emergency room or call 911.  A surgeon from [facility] is always on call at the hospital.  For further questions, please visit [email or web address]  Post Anesthesia Home Care Instructions  Activity: Get plenty of rest for the remainder of the day. A responsible individual must stay with you for 24 hours following the procedure.  For the next 24 hours, DO NOT: -Drive a car -Paediatric nurse -Drink alcoholic beverages -Take any medication unless instructed by your physician -Make any legal decisions or sign important papers.  Meals: Start with liquid foods such as gelatin or soup. Progress to regular foods as tolerated. Avoid greasy, spicy, heavy foods. If nausea and/or vomiting occur, drink only clear liquids until the nausea and/or vomiting subsides. Call your physician if vomiting continues.  Special Instructions/Symptoms: Your throat may feel dry or sore from the anesthesia or the breathing tube placed in your throat during surgery. If this causes discomfort, gargle with warm salt water. The discomfort should disappear within 24 hours.  If you had a scopolamine patch placed behind your ear for the management of post- operative nausea and/or vomiting:  1. The medication in the patch is effective for 72 hours, after which it should be removed.  Wrap patch in a tissue and discard in the trash. Wash hands thoroughly with soap and water. 2. You may remove the patch earlier than 72 hours if you experience unpleasant side effects which may include dry mouth, dizziness or visual disturbances. 3. Avoid touching the patch. Wash your hands with  soap and water after contact with the patch.

## 2020-10-13 NOTE — Anesthesia Procedure Notes (Signed)
Procedure Name: LMA Insertion Date/Time: 10/13/2020 8:26 AM Performed by: Willa Frater, CRNA Pre-anesthesia Checklist: Patient identified, Emergency Drugs available, Suction available and Patient being monitored Patient Re-evaluated:Patient Re-evaluated prior to induction Oxygen Delivery Method: Circle system utilized Preoxygenation: Pre-oxygenation with 100% oxygen Induction Type: IV induction Ventilation: Mask ventilation without difficulty LMA: LMA inserted LMA Size: 4.0 Number of attempts: 1 Airway Equipment and Method: Bite block Placement Confirmation: positive ETCO2 Tube secured with: Tape Dental Injury: Teeth and Oropharynx as per pre-operative assessment

## 2020-10-13 NOTE — Anesthesia Postprocedure Evaluation (Signed)
Anesthesia Post Note  Patient: Laura James  Procedure(s) Performed: RIGHT BREAST LUMPECTOMY WITH RADIOACTIVE SEED LOCALIZATION (Right: Breast)     Patient location during evaluation: PACU Anesthesia Type: General Level of consciousness: awake and alert Pain management: pain level controlled Vital Signs Assessment: post-procedure vital signs reviewed and stable Respiratory status: spontaneous breathing, nonlabored ventilation, respiratory function stable and patient connected to nasal cannula oxygen Cardiovascular status: blood pressure returned to baseline and stable Postop Assessment: no apparent nausea or vomiting Anesthetic complications: no   No notable events documented.  Last Vitals:  Vitals:   10/13/20 0945 10/13/20 1017  BP: 134/76 (!) 142/85  Pulse: (!) 57 (!) 58  Resp: 16 20  Temp:  36.4 C  SpO2: 96% 96%    Last Pain:  Vitals:   10/13/20 1017  TempSrc: Oral  PainSc: 0-No pain                 Adely Facer

## 2020-10-13 NOTE — Transfer of Care (Signed)
Immediate Anesthesia Transfer of Care Note  Patient: Laura James  Procedure(s) Performed: RIGHT BREAST LUMPECTOMY WITH RADIOACTIVE SEED LOCALIZATION (Right: Breast)  Patient Location: PACU  Anesthesia Type:General  Level of Consciousness: sedated  Airway & Oxygen Therapy: Patient Spontanous Breathing and Patient connected to face mask oxygen  Post-op Assessment: Report given to RN and Post -op Vital signs reviewed and stable  Post vital signs: Reviewed and stable  Last Vitals:  Vitals Value Taken Time  BP    Temp    Pulse    Resp    SpO2      Last Pain:  Vitals:   10/13/20 0655  TempSrc: Oral  PainSc: 0-No pain      Patients Stated Pain Goal: 8 (38/17/71 1657)  Complications: No notable events documented.

## 2020-10-13 NOTE — Op Note (Signed)
Preoperative diagnosis: Stage 0 right breast cancer Postoperative diagnosis: Same as above Procedure: Right breast radioactive seed guided lumpectomy Surgeon: Dr. Serita Grammes Anesthesia: General Estimated blood loss: Minimal Complications: None Drains: None Specimens: 1.  Right breast lumpectomy containing seed and clip marked with paint 2.  Additional medial, posterior, inferior margins marked short superior, long lateral, double deep Special count was correct completion Disposition recovery stable condition  Indications: This is 70 year old otherwise healthy female underwent a screening mammogram that showed 1 cm area of calcifications in the right side.  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  We discussed all of her options and elected to proceed with a lumpectomy.  Procedure: After informed consent was obtained the patient was given antibiotics.  SCDs were in place.  She was taken the operating room placed under general anesthesia.  She was then prepped and draped in the standard sterile surgical fashion.  Surgical timeout was then performed.  This was in the upper outer quadrant and was fairly close to the skin.  I elected to make an incision overlying this in a curvilinear fashion after infiltrating Marcaine throughout the area.  I then used the neoprobe to guide excision of the seed and the surrounding tissue with an attempt to get a clear margin.  Mammography confirmed removal of the seed and the clip.  I did take 3 additional margins as I thought I might be somewhat close in these margins.  Hemostasis was observed.  I then closed the cavity with 2-0 Vicryl.  The skin was closed with 3-0 Vicryl for Monocryl.  Glue and Steri-Strips were applied.  She tolerated this well was extubated and transferred to recovery stable.

## 2020-10-13 NOTE — H&P (Signed)
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 She has no prior breast history. She had no mass or discharge. She has no family history of any significance. She has a past medical history of hypertension and hypercholesterolemia. She underwent a screening mammogram that shows B density breast. She had a 1 cm area of calcifications on the right side. This underwent biopsy and is a grade 3 ductal carcinoma in situ that is ER positive at 95%, PR positive at 90%. She presents today with her husband to discuss her options.  Review of Systems: A complete review of systems was obtained from the patient. I have reviewed this information and discussed as appropriate with the patient. See HPI as well for other ROS.  Review of Systems  All other systems reviewed and are negative.  Medical History: Past Medical History:  Diagnosis Date   Arthritis   GERD (gastroesophageal reflux disease)   Hypertension   Sleep apnea   Patient Active Problem List  Diagnosis   Essential hypertension, benign   Hyperlipidemia   Past Surgical History:  Procedure Laterality Date   CHOLECYSTECTOMY   HYSTERECTOMY   TONSILLECTOMY  1970    Allergies  Allergen Reactions   Adhesive Tape-Silicones Itching and Rash   Latex Rash   Current Outpatient Medications on File Prior to Visit  Medication Sig Dispense Refill   albuterol 90 mcg/actuation inhaler INHALE 2 PUFFS BY MOUTH EVERY 6 HOURS AS NEEDED FOR WHEEZE OR SHORTNESS OF BREATH   atenoloL (TENORMIN) 25 MG tablet Take by mouth   atorvastatin (LIPITOR) 10 MG tablet Take 1 tablet by mouth once daily   fluticasone propionate (FLONASE) 50 mcg/actuation nasal spray Place into one nostril   hydroCHLOROthiazide (HYDRODIURIL) 25 MG tablet Take 1 tablet by mouth once daily   meloxicam (MOBIC) 7.5 MG tablet Take by mouth   No current facility-administered medications on file prior to visit.   Family History  Problem Relation Age of Onset   High blood pressure  (Hypertension) Mother   High blood pressure (Hypertension) Brother    Social History   Tobacco Use  Smoking Status Never Smoker  Smokeless Tobacco Never Used    Social History   Socioeconomic History   Marital status: Married  Tobacco Use   Smoking status: Never Smoker   Smokeless tobacco: Never Used  Substance and Sexual Activity   Alcohol use: Not Currently   Drug use: Not Currently   Objective:   Vitals:  09/24/20 1035  BP: 136/78  Pulse: 58  Weight: 88.3 kg (194 lb 9.6 oz)  Height: 167.6 cm (5\' 6" )   Body mass index is 31.41 kg/m.  Physical Exam Constitutional:  Appearance: Normal appearance.  Cardiovascular:  Rate and Rhythm: Normal rate.  Pulmonary:  Effort: Pulmonary effort is normal.  Chest:  Breasts:  Right: No mass or nipple discharge.  Left: No mass or nipple discharge.   Lymphadenopathy:  Upper Body:  Right upper body: No supraclavicular adenopathy.  Left upper body: No supraclavicular or axillary adenopathy.  Neurological:  Mental Status: She is alert.    Assessment and Plan:  Ductal carcinoma in situ (DCIS) of right breast Right breast seed guided lumpectomy  We discussed the staging and pathophysiology of breast cancer. We discussed all of the different options for treatment for breast cancer including surgery, chemotherapy, radiation therapy, Herceptin, and antiestrogen therapy. She is not a comet candidate due to high grade. She does not need a node biopsy due to dcis.  We discussed the options  for treatment of the breast cancer which included lumpectomy versus a mastectomy. We discussed the performance of the lumpectomy with radioactive seed placement. We discussed a 5-10% chance of a positive margin requiring reexcision in the operating room. We also discussed that she might need radiation therapy if she undergoes lumpectomy. We discussed mastectomy and the postoperative care for that as well. Mastectomy can be followed by reconstruction.  The decision for lumpectomy vs mastectomy has no impact on decision for chemotherapy. Most mastectomy patients will not need radiation therapy. We discussed that there is no difference in her survival whether she undergoes lumpectomy with radiation therapy or antiestrogen therapy versus a mastectomy. There is also no real difference between her recurrence in the breast. We discussed the risks of operation including bleeding, infection, possible reoperation. She understands her further therapy will be based on what her stages at the time of her operation. Will refer to med onc and rad onc once she decides where

## 2020-10-13 NOTE — Interval H&P Note (Signed)
History and Physical Interval Note:  10/13/2020 7:06 AM  Laura James  has presented today for surgery, with the diagnosis of RIGHT BREAST DCIS.  The various methods of treatment have been discussed with the patient and family. After consideration of risks, benefits and other options for treatment, the patient has consented to  Procedure(s): RIGHT BREAST LUMPECTOMY WITH RADIOACTIVE SEED LOCALIZATION (Right) as a surgical intervention.  The patient's history has been reviewed, patient examined, no change in status, stable for surgery.  I have reviewed the patient's chart and labs.  Questions were answered to the patient's satisfaction.     Rolm Bookbinder

## 2020-10-19 ENCOUNTER — Encounter (HOSPITAL_BASED_OUTPATIENT_CLINIC_OR_DEPARTMENT_OTHER): Payer: Self-pay | Admitting: General Surgery

## 2020-10-19 LAB — SURGICAL PATHOLOGY

## 2020-10-21 ENCOUNTER — Other Ambulatory Visit: Payer: Self-pay | Admitting: *Deleted

## 2020-10-21 DIAGNOSIS — D0510 Intraductal carcinoma in situ of unspecified breast: Secondary | ICD-10-CM

## 2020-10-22 ENCOUNTER — Telehealth: Payer: Self-pay | Admitting: Hematology and Oncology

## 2020-10-22 NOTE — Telephone Encounter (Signed)
Scheduled appt per 9/29 referral. Pt is aware of appt date and time.

## 2020-10-25 NOTE — Progress Notes (Signed)
New Breast Cancer Diagnosis: Right Breast UOQ  Did patient present with symptoms (if so, please note symptoms) or screening mammography?:Screening Calcifications    Location and Extent of disease :right breast. Located in the upper outer quadrant, measured 1 cm area of calcifications.   Histology per Pathology Report: grade 3, DCIS 10/13/2020  Receptor Status: ER(positive), PR (positive), Her2-neu (), Ki-(%)  Surgeon and surgical plan, if any:  Dr. Donne Hazel -She is not a comet candidate due to high grade. She does not need a node biopsy due to dcis. -Will refer to med onc and rad onc once she decides where. -Right Breast Lumpectomy with radioactive seed localization 10/13/2020. -Follow-up completed  Medical oncologist, treatment if any:   Dr. Lindi Adie 10/28/2020 3:45 pm   Family History of Breast/Ovarian/Prostate Cancer: No  Lymphedema issues, if any:  no    Pain issues, if any:  Has occasional stabbing shooting pains in her breast.   SAFETY ISSUES: Prior radiation? no Pacemaker/ICD? No Possible current pregnancy? Hysterectomy Is the patient on methotrexate? no  Current Complaints / other details:

## 2020-10-26 ENCOUNTER — Ambulatory Visit
Admission: RE | Admit: 2020-10-26 | Discharge: 2020-10-26 | Disposition: A | Discharge status: Home / Self Care | Payer: Medicare Other | Source: Ambulatory Visit | Attending: Radiation Oncology | Admitting: Radiation Oncology

## 2020-10-26 ENCOUNTER — Other Ambulatory Visit: Payer: Self-pay

## 2020-10-26 ENCOUNTER — Encounter: Payer: Self-pay | Admitting: Radiation Oncology

## 2020-10-26 VITALS — BP 131/81 | HR 62 | Temp 97.2°F | Resp 20 | Wt 191.8 lb

## 2020-10-26 DIAGNOSIS — Z79899 Other long term (current) drug therapy: Secondary | ICD-10-CM | POA: Insufficient documentation

## 2020-10-26 DIAGNOSIS — G4730 Sleep apnea, unspecified: Secondary | ICD-10-CM | POA: Diagnosis not present

## 2020-10-26 DIAGNOSIS — C50411 Malignant neoplasm of upper-outer quadrant of right female breast: Secondary | ICD-10-CM | POA: Diagnosis not present

## 2020-10-26 DIAGNOSIS — Z7982 Long term (current) use of aspirin: Secondary | ICD-10-CM | POA: Diagnosis not present

## 2020-10-26 DIAGNOSIS — D0511 Intraductal carcinoma in situ of right breast: Secondary | ICD-10-CM | POA: Diagnosis not present

## 2020-10-26 DIAGNOSIS — Z86010 Personal history of colonic polyps: Secondary | ICD-10-CM | POA: Insufficient documentation

## 2020-10-26 DIAGNOSIS — K449 Diaphragmatic hernia without obstruction or gangrene: Secondary | ICD-10-CM | POA: Diagnosis not present

## 2020-10-26 DIAGNOSIS — Z801 Family history of malignant neoplasm of trachea, bronchus and lung: Secondary | ICD-10-CM | POA: Diagnosis not present

## 2020-10-26 DIAGNOSIS — Z170 Estrogen receptor positive status [ER+]: Secondary | ICD-10-CM | POA: Insufficient documentation

## 2020-10-26 DIAGNOSIS — K219 Gastro-esophageal reflux disease without esophagitis: Secondary | ICD-10-CM | POA: Insufficient documentation

## 2020-10-26 DIAGNOSIS — Z803 Family history of malignant neoplasm of breast: Secondary | ICD-10-CM | POA: Insufficient documentation

## 2020-10-26 DIAGNOSIS — I10 Essential (primary) hypertension: Secondary | ICD-10-CM | POA: Insufficient documentation

## 2020-10-26 DIAGNOSIS — M8580 Other specified disorders of bone density and structure, unspecified site: Secondary | ICD-10-CM | POA: Diagnosis not present

## 2020-10-26 DIAGNOSIS — Z87891 Personal history of nicotine dependence: Secondary | ICD-10-CM | POA: Insufficient documentation

## 2020-10-26 DIAGNOSIS — K2270 Barrett's esophagus without dysplasia: Secondary | ICD-10-CM | POA: Diagnosis not present

## 2020-10-26 DIAGNOSIS — E039 Hypothyroidism, unspecified: Secondary | ICD-10-CM | POA: Insufficient documentation

## 2020-10-26 NOTE — Progress Notes (Signed)
Radiation Oncology         [phone] ________________________________  Name: Laura James        MRN: [id number]  Date of Service: 10/26/2020 DOB: 1950-06-30  RK:YHCWC, Theador Hawthorne, FNP  Rolm Bookbinder, MD     REFERRING PHYSICIAN: Rolm Bookbinder, MD   DIAGNOSIS: There were no encounter diagnoses.   HISTORY OF PRESENT ILLNESS: Laura James is a 70 y.o. female seen iat the request of Dr. Donne Hazel for a diagnosis of breast cancer.  The patient was found on screening mammography to have an abnormality in the right agnostic imaging showed a 1 cm group of calcifications in the upper outer quadrant of the right breast.  She underwent stereotactic biopsy which revealed high-grade DCIS with calcifications and necrosis, her tumor was ER/PR positive and her biopsy was performed on 09/20/2020.  She was counseled on the role of surgery and underwent right lumpectomy on 10/13/2020, final pathology shows a 6 mm intermediate grade DCIS with negative margins.  She is seen to discuss treatment recommendations of her cancer.    PREVIOUS RADIATION THERAPY: No   PAST MEDICAL HISTORY:  Past Medical History:  Diagnosis Date   Barrett esophagus    Benign neoplasm of other and unspecified site of the digestive system 04/29/2007   Chronic bronchitis (HCC)    Depression    Diverticulosis of colon (without mention of hemorrhage)    Esophageal reflux    Essential hypertension, benign    Gastritis    Grave's disease    Hiatal hernia    Hypothyroidism    IBS (irritable bowel syndrome)    Obesity    Osteopenia    Sleep apnea    Symptomatic menopausal or female climacteric states        PAST SURGICAL HISTORY: Past Surgical History:  Procedure Laterality Date   ABDOMINAL HYSTERECTOMY N/A    BREAST LUMPECTOMY WITH RADIOACTIVE SEED LOCALIZATION Right 10/13/2020   Procedure: RIGHT BREAST LUMPECTOMY WITH RADIOACTIVE SEED LOCALIZATION;  Surgeon: Rolm Bookbinder, MD;  Location: [facility];  Service: General;  Laterality: Right;   CHOLECYSTECTOMY     INCONTINENCE SURGERY     TONSILLECTOMY       FAMILY HISTORY:  Family History  Problem Relation Age of Onset   Lung cancer Father    Hypertension Mother    Arthritis Mother    Osteoporosis Mother    Kidney disease Mother        related to APAP use   Diabetes Other    Clotting disorder Other    Breast cancer Other        aunt   Hypertension Brother    Hypertension Daughter    Diabetes Son    Diabetes Daughter    Colon cancer Neg Hx      SOCIAL HISTORY:  reports that she quit smoking about 52 years ago. Her smoking use included cigarettes. She has a 2.50 pack-year smoking history. She has never used smokeless tobacco. She reports that she does not drink alcohol and does not use drugs. The patient is married and lives in [place]. 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 She's accompanied by her husband Francee Piccolo.    ALLERGIES: Adhesive [tape] and Latex   MEDICATIONS:  Current Outpatient Medications  Medication Sig Dispense Refill   albuterol (PROAIR HFA) 108 (90 Base) MCG/ACT inhaler INHALE 2 PUFFS BY MOUTH EVERY 6 HOURS AS NEEDED FOR WHEEZE OR SHORTNESS OF BREATH  8.5 each 2   aspirin 81 MG tablet Take 81 mg by mouth daily.     atenolol (TENORMIN) 25 MG tablet Take 0.5 tablets (12.5 mg total) by mouth daily. (Needs to be seen before next refill) 30 tablet 5   atorvastatin (LIPITOR) 10 MG tablet Take 1 tablet (10 mg total) by mouth daily. 90 tablet 0   cetirizine (ZYRTEC) 10 MG tablet Take 10 mg by mouth daily as needed.      cholecalciferol (VITAMIN D) 1000 units tablet Take 1 tablet (1,000 Units total) by mouth daily.     Cyanocobalamin (B-12) 3000 MCG CAPS Take 1 capsule by mouth daily.     docusate sodium (COLACE) 100 MG capsule Take 100 mg by mouth daily as needed for mild constipation.     fluticasone (FLONASE) 50 MCG/ACT nasal spray Place 2  sprays into both nostrils daily. 16 g 6   hydrochlorothiazide (HYDRODIURIL) 25 MG tablet Take 1 tablet (25 mg total) by mouth daily. 90 tablet 1   Iron, Ferrous Sulfate, 325 (65 Fe) MG TABS Take 325 mg by mouth 2 (two) times daily with a meal. 180 tablet 5   magnesium oxide (MAG-OX) 400 MG tablet Take 400 mg by mouth daily.     meloxicam (MOBIC) 7.5 MG tablet Take 1 tablet (7.5 mg total) by mouth daily. 30 tablet 5   omeprazole (PRILOSEC) 20 MG capsule Take 1 capsule (20 mg total) by mouth daily. 30 capsule 3   No current facility-administered medications for this encounter.     REVIEW OF SYSTEMS: On review of systems, the patient reports that she is doing well overall. She feels like she's healing pretty well and is wearing her brain 24 hours a day. She does have some shooting pain in her right breast later in the day as well. No drainage or redness has been noted. No other complaints are verbalized.      PHYSICAL EXAM:  Wt Readings from Last 3 Encounters:  10/13/20 192 lb 10.9 oz (87.4 kg)  09/23/20 194 lb 12.8 oz (88.4 kg)  06/10/20 190 lb 3.2 oz (86.3 kg)   Temp Readings from Last 3 Encounters:  10/13/20 97.6 F (36.4 C) (Oral)  09/23/20 (!) 97.2 F (36.2 C) (Temporal)  06/10/20 98 F (36.7 C) (Temporal)   BP Readings from Last 3 Encounters:  10/13/20 (!) 142/85  09/23/20 124/72  06/10/20 126/77   Pulse Readings from Last 3 Encounters:  10/13/20 (!) 58  09/23/20 63  06/10/20 73    In general this is a well appearing African American female in no acute distress. She's alert and oriented x4 and appropriate throughout the examination. Cardiopulmonary assessment is negative for acute distress and she exhibits normal effort. Her right breast reveals seborrheic keratoses throughout and a well healed incision site without erythema, separation or drainage.     ECOG = 1  0 - Asymptomatic (Fully active, able to carry on all predisease activities without restriction)  1 -  Symptomatic but completely ambulatory (Restricted in physically strenuous activity but ambulatory and able to carry out work of a light or sedentary nature. For example, light housework, office work)  2 - Symptomatic, <50% in bed during the day (Ambulatory and capable of all self care but unable to carry out any work activities. Up and about more than 50% of waking hours)  3 - Symptomatic, >50% in bed, but not bedbound (Capable of only limited self-care, confined to bed or chair 50% or more of waking  hours)  4 - Bedbound (Completely disabled. Cannot carry on any self-care. Totally confined to bed or chair)  5 - Death   Eustace Pen MM, Creech RH, Tormey DC, et al. [phone]). "Toxicity and response criteria of the [facility]". [place] Oncol. 5 (6): 649-55    LABORATORY DATA:  Lab Results  Component Value Date   WBC 8.0 09/23/2020   HGB 13.7 09/23/2020   HCT 42.2 09/23/2020   MCV 86 09/23/2020   PLT 306 09/23/2020   Lab Results  Component Value Date   NA 137 10/07/2020   K 3.8 10/07/2020   CL 103 10/07/2020   CO2 28 10/07/2020   Lab Results  Component Value Date   ALT 16 09/23/2020   AST 20 09/23/2020   ALKPHOS 82 09/23/2020   BILITOT <0.2 09/23/2020      RADIOGRAPHY: MM Breast Surgical Specimen  Result Date: 10/13/2020 CLINICAL DATA:  Specimen radiograph.  Status post excisional biopsy. EXAM: SPECIMEN RADIOGRAPH OF THE RIGHT BREAST COMPARISON:  Previous exam(s). FINDINGS: Status post excision of the right breast. The radioactive seed and biopsy marker clip are present, completely intact, and were marked for pathology. IMPRESSION: Specimen radiograph of the right breast. Electronically Signed   By: Lillia Mountain M.D.   On: 10/13/2020 08:54  MM RT RADIOACTIVE SEED LOC MAMMO GUIDE  Result Date: 10/12/2020 CLINICAL DATA:  70 year old female with newly diagnosed right breast cancer presenting for seed localization. EXAM: MAMMOGRAPHIC GUIDED RADIOACTIVE SEED  LOCALIZATION OF THE RIGHT BREAST COMPARISON:  Previous exam(s). FINDINGS: Patient presents for radioactive seed localization prior to right lumpectomy. I met with the patient and we discussed the procedure of seed localization including benefits and alternatives. We discussed the high likelihood of a successful procedure. We discussed the risks of the procedure including infection, bleeding, tissue injury and further surgery. We discussed the low dose of radioactivity involved in the procedure. Informed, written consent was given. The usual time-out protocol was performed immediately prior to the procedure. Using mammographic guidance, sterile technique, 1% lidocaine and an I-125 radioactive seed, the X biopsy marking clip was localized using a lateral approach. The follow-up mammogram images confirm the seed in the expected location and were marked for Dr. Donne Hazel. Follow-up survey of the patient confirms presence of the radioactive seed. Order number of I-125 seed:  [id number]. Total activity: 0.245 mCi reference Date: 13 September 2020 The patient tolerated the procedure well and was released from the [facility]. She was given instructions regarding seed removal. IMPRESSION: Radioactive seed localization right breast. No apparent complications. Electronically Signed   By: Audie Pinto M.D.   On: 10/12/2020 09:09      IMPRESSION/PLAN: 1. Intermediate grade, ER/PR positive DCIS of the right breast. Dr. Lisbeth Renshaw discusses the pathology findings and reviews the nature of early stage breast disease. Dr. Lisbeth Renshaw discusses that she is healing well and would benefit from external radiotherapy to the breast  to reduce risks of local recurrence followed by antiestrogen therapy. We discussed the risks, benefits, short, and long term effects of radiotherapy, as well as the curative intent, and the patient is interested in proceeding. She may consider treatment closer to home in [place] through [facility]. Dr. Lisbeth Renshaw  discusses the delivery and logistics of radiotherapy and anticipates a course of 4 weeks of radiotherapy. She is going to think about her options of where she would like therapy and let us know. I will reach out to her next week if I haven't heard from  her to make plans on treatment location.   In a visit lasting 60 minutes, greater than 50% of the time was spent face to face reviewing her case, as well as in preparation of, discussing, and coordinating the patient's care.  The above documentation reflects my direct findings during this shared patient visit. Please see the separate note by Dr. Lisbeth Renshaw on this date for the remainder of the patient's plan of care.    Carola Rhine, [facility]    **Disclaimer: This note was dictated with voice recognition software. Similar sounding words can inadvertently be transcribed and this note may contain transcription errors which may not have been corrected upon publication of note.**

## 2020-10-27 ENCOUNTER — Ambulatory Visit: Payer: Medicare Other | Admitting: Hematology and Oncology

## 2020-10-27 NOTE — Progress Notes (Signed)
[facility] CONSULT NOTE  Patient Care Team: Sharion Balloon, FNP as PCP - General (Family Medicine)  CHIEF COMPLAINTS/PURPOSE OF CONSULTATION:  Newly diagnosed right breast cancer  HISTORY OF PRESENTING ILLNESS:  Laura James 70 y.o. female is here because of recent diagnosis of DCIS of the right breast. Screening mammogram on 08/30/2020 showed calcifications in the right breast. Diagnostic mammogram and [place] on 09/13/2020 showed 1 cm group of indeterminate calcifications within the upper outer right breast. Right lumpectomy on 10/13/2020 showed intermediate grade DCIS with margins uninvolved by carcinoma. She presents to the clinic today for initial evaluation and discussion of treatment options.   I reviewed her records extensively and collaborated the history with the patient.  SUMMARY OF ONCOLOGIC HISTORY: Oncology History  Ductal carcinoma in situ (DCIS) of right breast  10/26/2020 Initial Diagnosis   Screening mammogram: calcifications in the right breast. Diagnostic mammogram and [place]: 1 cm group of indeterminate calcifications within the upper outer right breast.     Surgery   Right lumpectomy: intermediate grade DCIS 0.6 cm with margins uninvolved by carcinoma.  ER 95%, PR 90%     MEDICAL HISTORY:  Past Medical History:  Diagnosis Date   Barrett esophagus    Benign neoplasm of other and unspecified site of the digestive system 04/29/2007   Breast cancer ([facility]) 10/13/2020   Chronic bronchitis (HCC)    Depression    Diverticulosis of colon (without mention of hemorrhage)    Esophageal reflux    Essential hypertension, benign    Gastritis    Grave's disease    Hiatal hernia    Hypothyroidism    IBS (irritable bowel syndrome)    Obesity    Osteopenia    Sleep apnea    Symptomatic menopausal or female climacteric states     SURGICAL HISTORY: Past Surgical History:  Procedure Laterality Date   ABDOMINAL HYSTERECTOMY N/A    BREAST LUMPECTOMY WITH  RADIOACTIVE SEED LOCALIZATION Right 10/13/2020   Procedure: RIGHT BREAST LUMPECTOMY WITH RADIOACTIVE SEED LOCALIZATION;  Surgeon: Rolm Bookbinder, MD;  Location: [facility];  Service: General;  Laterality: Right;   CHOLECYSTECTOMY     INCONTINENCE SURGERY     TONSILLECTOMY      SOCIAL HISTORY: Social History   Socioeconomic History   Marital status: Married    Spouse name: Roger   Number of children: 3   Years of education: 16   Highest education level: Master's degree (e.g., MA, MS, MEng, MEd, MSW, MBA)  Occupational History   Occupation: Hospice- Chaplain    Comment: [employer]   Occupation: retired  Tobacco Use   Smoking status: Former    Packs/day: 0.50    Years: 5.00    Pack years: 2.50    Types: Cigarettes    Quit date: 01/24/1968    Years since quitting: 52.7   Smokeless tobacco: Never  Vaping Use   Vaping Use: Never used  Substance and Sexual Activity   Alcohol use: No    Alcohol/week: 0.0 standard drinks   Drug use: No   Sexual activity: Yes  Other Topics Concern   Not on file  Social History Narrative   Lives at home with husband    Social Determinants of Health   Financial Resource Strain: Low Risk    Difficulty of Paying Living Expenses: Not hard at all  Food Insecurity: No Food Insecurity   Worried About Estate manager/land agent of Food in the Last Year: Never true   Ran  Out of Food in the Last Year: Never true  Transportation Needs: No Transportation Needs   Lack of Transportation (Medical): No   Lack of Transportation (Non-Medical): No  Physical Activity: Inactive   Days of Exercise per Week: 0 days   Minutes of Exercise per Session: 0 min  Stress: No Stress Concern Present   Feeling of Stress : Not at all  Social Connections: Socially Integrated   Frequency of Communication with Friends and Family: More than three times a week   Frequency of Social Gatherings with Friends and Family: More than three times a week   Attends Religious  Services: More than 4 times per year   Active Member of [employer] or Organizations: Yes   Attends Music therapist: More than 4 times per year   Marital Status: Married  Human resources officer Violence: Not At Risk   Fear of Current or Ex-Partner: No   Emotionally Abused: No   Physically Abused: No   Sexually Abused: No    FAMILY HISTORY: Family History  Problem Relation Age of Onset   Hypertension Mother    Arthritis Mother    Osteoporosis Mother    Kidney disease Mother        related to APAP use   Lung cancer Father    Hypertension Brother    Hypertension Daughter    Diabetes Daughter    Diabetes Son    Diabetes Other    Clotting disorder Other    Colon cancer Neg Hx     ALLERGIES:  is allergic to adhesive [tape] and latex.  MEDICATIONS:  Current Outpatient Medications  Medication Sig Dispense Refill   albuterol (PROAIR HFA) 108 (90 Base) MCG/ACT inhaler INHALE 2 PUFFS BY MOUTH EVERY 6 HOURS AS NEEDED FOR WHEEZE OR SHORTNESS OF BREATH 8.5 each 2   aspirin 81 MG tablet Take 81 mg by mouth daily.     atenolol (TENORMIN) 25 MG tablet Take 0.5 tablets (12.5 mg total) by mouth daily. (Needs to be seen before next refill) 30 tablet 5   atorvastatin (LIPITOR) 10 MG tablet Take 1 tablet (10 mg total) by mouth daily. 90 tablet 0   cetirizine (ZYRTEC) 10 MG tablet Take 10 mg by mouth daily as needed.      cholecalciferol (VITAMIN D) 1000 units tablet Take 1 tablet (1,000 Units total) by mouth daily.     Cyanocobalamin (B-12) 3000 MCG CAPS Take 1 capsule by mouth daily.     docusate sodium (COLACE) 100 MG capsule Take 100 mg by mouth daily as needed for mild constipation.     fluticasone (FLONASE) 50 MCG/ACT nasal spray Place 2 sprays into both nostrils daily. 16 g 6   hydrochlorothiazide (HYDRODIURIL) 25 MG tablet Take 1 tablet (25 mg total) by mouth daily. 90 tablet 1   Iron, Ferrous Sulfate, 325 (65 Fe) MG TABS Take 325 mg by mouth 2 (two) times daily with a meal. 180 tablet  5   magnesium oxide (MAG-OX) 400 MG tablet Take 400 mg by mouth daily.     meloxicam (MOBIC) 7.5 MG tablet Take 1 tablet (7.5 mg total) by mouth daily. 30 tablet 5   omeprazole (PRILOSEC) 20 MG capsule Take 1 capsule (20 mg total) by mouth daily. 30 capsule 3   No current facility-administered medications for this visit.    REVIEW OF SYSTEMS:   Constitutional: Denies fevers, chills or abnormal night sweats Eyes: Denies blurriness of vision, double vision or watery eyes Ears, nose, mouth, throat, and  face: Denies mucositis or sore throat Respiratory: Denies cough, dyspnea or wheezes Cardiovascular: Denies palpitation, chest discomfort or lower extremity swelling Gastrointestinal:  Denies nausea, heartburn or change in bowel habits Skin: Denies abnormal skin rashes Lymphatics: Denies new lymphadenopathy or easy bruising Neurological:Denies numbness, tingling or new weaknesses Behavioral/Psych: Mood is stable, no new changes  Breast: Recent lumpectomy All other systems were reviewed with the patient and are negative.  PHYSICAL EXAMINATION: ECOG PERFORMANCE STATUS: 1 - Symptomatic but completely ambulatory  Vitals:   10/28/20 1542  BP: 129/66  Pulse: 62  Resp: 18  Temp: 97.6 F (36.4 C)  SpO2: 98%   Filed Weights   10/28/20 1542  Weight: 193 lb 9.6 oz (87.8 kg)      LABORATORY DATA:  I have reviewed the data as listed Lab Results  Component Value Date   WBC 8.0 09/23/2020   HGB 13.7 09/23/2020   HCT 42.2 09/23/2020   MCV 86 09/23/2020   PLT 306 09/23/2020   Lab Results  Component Value Date   NA 137 10/07/2020   K 3.8 10/07/2020   CL 103 10/07/2020   CO2 28 10/07/2020    RADIOGRAPHIC STUDIES: I have personally reviewed the radiological reports and agreed with the findings in the report.  ASSESSMENT AND PLAN:  Ductal carcinoma in situ (DCIS) of right breast 10/13/2020:Right lumpectomy: intermediate grade DCIS 0.6 cm with margins uninvolved by carcinoma.  ER  95%, PR 90%  Pathology review: I discussed with the patient the difference between DCIS and invasive breast cancer. It is considered a precancerous lesion. DCIS is classified as a 0. It is generally detected through mammograms as calcifications. We discussed the significance of grades and its impact on prognosis. We also discussed the importance of ER and PR receptors and their implications to adjuvant treatment options. Prognosis of DCIS dependence on grade, comedo necrosis. It is anticipated that if not treated, 20-30% of DCIS can develop into invasive breast cancer.  Recommendation: 1. Followed by adjuvant radiation therapy (to be done at [facility]) 2. Followed by antiestrogen therapy with tamoxifen 5 years  Tamoxifen counseling: We discussed the risks and benefits of tamoxifen. These include but not limited to insomnia, hot flashes, mood changes, vaginal dryness, and weight gain. Although rare, serious side effects including endometrial cancer, risk of blood clots were also discussed. We strongly believe that the benefits far outweigh the risks. Patient understands these risks and consented to starting treatment. Planned treatment duration is 5 years.  Return to clinic after radiation to start antiestrogen therapy.  Because of her distance that she has to travel, we will see her virtually after radiation is done.    All questions were answered. The patient knows to call the clinic with any problems, questions or concerns.   Rulon Eisenmenger, MD, MPH 10/28/2020    I, Thana Ates, am acting as scribe for Nicholas Lose, MD.  I have reviewed the above documentation for accuracy and completeness, and I agree with the above.

## 2020-10-28 ENCOUNTER — Inpatient Hospital Stay: Payer: Medicare Other | Attending: Hematology and Oncology | Admitting: Hematology and Oncology

## 2020-10-28 ENCOUNTER — Other Ambulatory Visit: Payer: Self-pay

## 2020-10-28 DIAGNOSIS — Z170 Estrogen receptor positive status [ER+]: Secondary | ICD-10-CM | POA: Diagnosis not present

## 2020-10-28 DIAGNOSIS — Z79899 Other long term (current) drug therapy: Secondary | ICD-10-CM | POA: Insufficient documentation

## 2020-10-28 DIAGNOSIS — Z801 Family history of malignant neoplasm of trachea, bronchus and lung: Secondary | ICD-10-CM | POA: Insufficient documentation

## 2020-10-28 DIAGNOSIS — K219 Gastro-esophageal reflux disease without esophagitis: Secondary | ICD-10-CM | POA: Diagnosis not present

## 2020-10-28 DIAGNOSIS — K589 Irritable bowel syndrome without diarrhea: Secondary | ICD-10-CM | POA: Diagnosis not present

## 2020-10-28 DIAGNOSIS — Z7982 Long term (current) use of aspirin: Secondary | ICD-10-CM | POA: Insufficient documentation

## 2020-10-28 DIAGNOSIS — Z87891 Personal history of nicotine dependence: Secondary | ICD-10-CM | POA: Diagnosis not present

## 2020-10-28 DIAGNOSIS — I10 Essential (primary) hypertension: Secondary | ICD-10-CM | POA: Diagnosis not present

## 2020-10-28 DIAGNOSIS — E669 Obesity, unspecified: Secondary | ICD-10-CM | POA: Diagnosis not present

## 2020-10-28 DIAGNOSIS — D0511 Intraductal carcinoma in situ of right breast: Secondary | ICD-10-CM | POA: Diagnosis not present

## 2020-10-28 DIAGNOSIS — K449 Diaphragmatic hernia without obstruction or gangrene: Secondary | ICD-10-CM | POA: Diagnosis not present

## 2020-10-28 DIAGNOSIS — K2270 Barrett's esophagus without dysplasia: Secondary | ICD-10-CM | POA: Insufficient documentation

## 2020-10-28 DIAGNOSIS — E039 Hypothyroidism, unspecified: Secondary | ICD-10-CM | POA: Insufficient documentation

## 2020-10-28 DIAGNOSIS — G4730 Sleep apnea, unspecified: Secondary | ICD-10-CM | POA: Diagnosis not present

## 2020-10-28 DIAGNOSIS — Z8719 Personal history of other diseases of the digestive system: Secondary | ICD-10-CM | POA: Insufficient documentation

## 2020-10-28 DIAGNOSIS — M8580 Other specified disorders of bone density and structure, unspecified site: Secondary | ICD-10-CM | POA: Diagnosis not present

## 2020-10-28 NOTE — Assessment & Plan Note (Signed)
10/13/2020:Right lumpectomy: intermediate grade DCIS 0.6 cm with margins uninvolved by carcinoma.  ER 95%, PR 90%  Pathology review: I discussed with the patient the difference between DCIS and invasive breast cancer. It is considered a precancerous lesion. DCIS is classified as a 0. It is generally detected through mammograms as calcifications. We discussed the significance of grades and its impact on prognosis. We also discussed the importance of ER and PR receptors and their implications to adjuvant treatment options. Prognosis of DCIS dependence on grade, comedo necrosis. It is anticipated that if not treated, 20-30% of DCIS can develop into invasive breast cancer.  Recommendation: 1. Followed by adjuvant radiation therapy 2. Followed by antiestrogen therapy with tamoxifen 5 years  Tamoxifen counseling: We discussed the risks and benefits of tamoxifen. These include but not limited to insomnia, hot flashes, mood changes, vaginal dryness, and weight gain. Although rare, serious side effects including endometrial cancer, risk of blood clots were also discussed. We strongly believe that the benefits far outweigh the risks. Patient understands these risks and consented to starting treatment. Planned treatment duration is 5 years.  Return to clinic after radiation to start antiestrogen therapy.

## 2020-11-01 ENCOUNTER — Encounter: Payer: Self-pay | Admitting: *Deleted

## 2020-11-01 ENCOUNTER — Telehealth: Payer: Self-pay | Admitting: Radiation Oncology

## 2020-11-01 DIAGNOSIS — D0511 Intraductal carcinoma in situ of right breast: Secondary | ICD-10-CM

## 2020-11-01 NOTE — Telephone Encounter (Signed)
I called and left a voicemail for the patient to let her know I received her message that she wants to meet Dr. Lynnette Caffey at [facility]. I will place a referral and encouraged her to call if she has questions.

## 2020-11-04 DIAGNOSIS — Z87891 Personal history of nicotine dependence: Secondary | ICD-10-CM | POA: Diagnosis not present

## 2020-11-04 DIAGNOSIS — Z803 Family history of malignant neoplasm of breast: Secondary | ICD-10-CM | POA: Diagnosis not present

## 2020-11-04 DIAGNOSIS — Z9011 Acquired absence of right breast and nipple: Secondary | ICD-10-CM | POA: Diagnosis not present

## 2020-11-04 DIAGNOSIS — D0511 Intraductal carcinoma in situ of right breast: Secondary | ICD-10-CM | POA: Diagnosis not present

## 2020-11-04 DIAGNOSIS — I10 Essential (primary) hypertension: Secondary | ICD-10-CM | POA: Diagnosis not present

## 2020-11-04 DIAGNOSIS — Z510 Encounter for antineoplastic radiation therapy: Secondary | ICD-10-CM | POA: Diagnosis not present

## 2020-11-08 ENCOUNTER — Encounter: Payer: Self-pay | Admitting: *Deleted

## 2020-11-15 DIAGNOSIS — Z803 Family history of malignant neoplasm of breast: Secondary | ICD-10-CM | POA: Diagnosis not present

## 2020-11-15 DIAGNOSIS — Z510 Encounter for antineoplastic radiation therapy: Secondary | ICD-10-CM | POA: Diagnosis not present

## 2020-11-15 DIAGNOSIS — D0511 Intraductal carcinoma in situ of right breast: Secondary | ICD-10-CM | POA: Diagnosis not present

## 2020-11-15 DIAGNOSIS — I10 Essential (primary) hypertension: Secondary | ICD-10-CM | POA: Diagnosis not present

## 2020-11-15 DIAGNOSIS — Z87891 Personal history of nicotine dependence: Secondary | ICD-10-CM | POA: Diagnosis not present

## 2020-11-15 DIAGNOSIS — Z9011 Acquired absence of right breast and nipple: Secondary | ICD-10-CM | POA: Diagnosis not present

## 2020-11-16 ENCOUNTER — Encounter: Payer: Self-pay | Admitting: Nurse Practitioner

## 2020-11-16 ENCOUNTER — Other Ambulatory Visit: Payer: Self-pay

## 2020-11-16 ENCOUNTER — Ambulatory Visit (INDEPENDENT_AMBULATORY_CARE_PROVIDER_SITE_OTHER): Payer: Medicare Other | Admitting: Nurse Practitioner

## 2020-11-16 VITALS — BP 127/72 | HR 59 | Temp 98.2°F | Ht 64.0 in | Wt 195.0 lb

## 2020-11-16 DIAGNOSIS — M5430 Sciatica, unspecified side: Secondary | ICD-10-CM | POA: Diagnosis not present

## 2020-11-16 DIAGNOSIS — M549 Dorsalgia, unspecified: Secondary | ICD-10-CM | POA: Diagnosis not present

## 2020-11-16 DIAGNOSIS — Z23 Encounter for immunization: Secondary | ICD-10-CM | POA: Diagnosis not present

## 2020-11-16 MED ORDER — METHYLPREDNISOLONE ACETATE 40 MG/ML IJ SUSP
40.0000 mg | Freq: Once | INTRAMUSCULAR | Status: AC
  Administered 2020-11-16: 40 mg via INTRAMUSCULAR

## 2020-11-16 MED ORDER — TRAMADOL HCL 50 MG PO TABS
50.0000 mg | ORAL_TABLET | Freq: Three times a day (TID) | ORAL | 0 refills | Status: AC | PRN
Start: 2020-11-16 — End: 2020-11-21

## 2020-11-16 MED ORDER — METHOCARBAMOL 500 MG PO TABS: 500.0000 mg | ORAL_TABLET | Freq: Four times a day (QID) | ORAL | 0 refills | Status: DC

## 2020-11-16 NOTE — Addendum Note (Signed)
Addended byCarrolyn Leigh on: 11/16/2020 03:22 PM   Modules accepted: Orders

## 2020-11-16 NOTE — Progress Notes (Signed)
Acute Office Visit  Subjective:    Patient ID: Laura James, female    DOB: 1950/01/25, 70 y.o.   MRN: [id number]  Chief Complaint  Patient presents with   Back Pain    Back Pain This is a recurrent problem. The current episode started 1 to 4 weeks ago. The problem occurs constantly. The problem has been gradually worsening since onset. The quality of the pain is described as aching. The pain is at a severity of 8/10. The pain is moderate. The symptoms are aggravated by position. She has tried nothing for the symptoms.   Past Medical History:  Diagnosis Date   Barrett esophagus    Benign neoplasm of other and unspecified site of the digestive system 04/29/2007   Breast cancer ([place]) 10/13/2020   Chronic bronchitis (HCC)    Depression    Diverticulosis of colon (without mention of hemorrhage)    Esophageal reflux    Essential hypertension, benign    Gastritis    Grave's disease    Hiatal hernia    Hypothyroidism    IBS (irritable bowel syndrome)    Obesity    Osteopenia    Sleep apnea    Symptomatic menopausal or female climacteric states     Past Surgical History:  Procedure Laterality Date   ABDOMINAL HYSTERECTOMY N/A    BREAST LUMPECTOMY WITH RADIOACTIVE SEED LOCALIZATION Right 10/13/2020   Procedure: RIGHT BREAST LUMPECTOMY WITH RADIOACTIVE SEED LOCALIZATION;  Surgeon: Rolm Bookbinder, MD;  Location: [facility];  Service: General;  Laterality: Right;   CHOLECYSTECTOMY     INCONTINENCE SURGERY     TONSILLECTOMY      Family History  Problem Relation Age of Onset   Hypertension Mother    Arthritis Mother    Osteoporosis Mother    Kidney disease Mother        related to APAP use   Lung cancer Father    Hypertension Brother    Hypertension Daughter    Diabetes Daughter    Diabetes Son    Diabetes Other    Clotting disorder Other    Colon cancer Neg Hx     Social History   Socioeconomic History   Marital status: Married    Spouse name:  Francee Piccolo   Number of children: 3   Years of education: 16   Highest education level: Master's degree (e.g., MA, MS, MEng, MEd, MSW, MBA)  Occupational History   Occupation: Hospice- Chaplain    Comment: [place]   Occupation: retired  Tobacco Use   Smoking status: Former    Packs/day: 0.50    Years: 5.00    Pack years: 2.50    Types: Cigarettes    Quit date: 01/24/1968    Years since quitting: 52.8   Smokeless tobacco: Never  Vaping Use   Vaping Use: Never used  Substance and Sexual Activity   Alcohol use: No    Alcohol/week: 0.0 standard drinks   Drug use: No   Sexual activity: Yes  Other Topics Concern   Not on file  Social History Narrative   Lives at home with husband    Social Determinants of Health   Financial Resource Strain: Low Risk    Difficulty of Paying Living Expenses: Not hard at all  Food Insecurity: No Food Insecurity   Worried About Charity fundraiser in the Last Year: Never true   Ran Out of Food in the Last Year: Never true  Transportation Needs: No Transportation Needs  Lack of Transportation (Medical): No   Lack of Transportation (Non-Medical): No  Physical Activity: Inactive   Days of Exercise per Week: 0 days   Minutes of Exercise per Session: 0 min  Stress: No Stress Concern Present   Feeling of Stress : Not at all  Social Connections: Socially Integrated   Frequency of Communication with Friends and Family: More than three times a week   Frequency of Social Gatherings with Friends and Family: More than three times a week   Attends Religious Services: More than 4 times per year   Active Member of [employer] or Organizations: Yes   Attends Music therapist: More than 4 times per year   Marital Status: Married  Human resources officer Violence: Not At Risk   Fear of Current or Ex-Partner: No   Emotionally Abused: No   Physically Abused: No   Sexually Abused: No    Outpatient Medications Prior to Visit  Medication Sig Dispense  Refill   albuterol (PROAIR HFA) 108 (90 Base) MCG/ACT inhaler INHALE 2 PUFFS BY MOUTH EVERY 6 HOURS AS NEEDED FOR WHEEZE OR SHORTNESS OF BREATH 8.5 each 2   aspirin 81 MG tablet Take 81 mg by mouth daily.     atenolol (TENORMIN) 25 MG tablet Take 0.5 tablets (12.5 mg total) by mouth daily. (Needs to be seen before next refill) 30 tablet 5   atorvastatin (LIPITOR) 10 MG tablet Take 1 tablet (10 mg total) by mouth daily. 90 tablet 0   cetirizine (ZYRTEC) 10 MG tablet Take 10 mg by mouth daily as needed.      cholecalciferol (VITAMIN D) 1000 units tablet Take 1 tablet (1,000 Units total) by mouth daily.     Cyanocobalamin (B-12) 3000 MCG CAPS Take 1 capsule by mouth daily.     docusate sodium (COLACE) 100 MG capsule Take 100 mg by mouth daily as needed for mild constipation.     fluticasone (FLONASE) 50 MCG/ACT nasal spray Place 2 sprays into both nostrils daily. 16 g 6   hydrochlorothiazide (HYDRODIURIL) 25 MG tablet Take 1 tablet (25 mg total) by mouth daily. 90 tablet 1   Iron, Ferrous Sulfate, 325 (65 Fe) MG TABS Take 325 mg by mouth 2 (two) times daily with a meal. 180 tablet 5   magnesium oxide (MAG-OX) 400 MG tablet Take 400 mg by mouth daily.     meloxicam (MOBIC) 7.5 MG tablet Take 1 tablet (7.5 mg total) by mouth daily. 30 tablet 5   omeprazole (PRILOSEC) 20 MG capsule Take 1 capsule (20 mg total) by mouth daily. 30 capsule 3   No facility-administered medications prior to visit.    Allergies  Allergen Reactions   Adhesive [Tape] Itching and Rash   Latex Rash    Review of Systems  Constitutional: Negative.   HENT: Negative.    Eyes: Negative.   Respiratory: Negative.    Musculoskeletal:  Positive for back pain.  All other systems reviewed and are negative.     Objective:    Physical Exam Vitals and nursing note reviewed.  Constitutional:      Appearance: Normal appearance.  HENT:     Head: Normocephalic.     Mouth/Throat:     Mouth: Mucous membranes are moist.      Pharynx: Oropharynx is clear.  Eyes:     Conjunctiva/sclera: Conjunctivae normal.  Cardiovascular:     Rate and Rhythm: Normal rate and regular rhythm.  Pulmonary:     Effort: Pulmonary effort is normal.  Breath sounds: Normal breath sounds.  Abdominal:     General: Bowel sounds are normal.  Musculoskeletal:     Lumbar back: Tenderness present.  Skin:    Findings: No rash.  Neurological:     Mental Status: She is alert and oriented to person, place, and time.    BP 127/72   Pulse (!) 59   Temp 98.2 F (36.8 C) (Temporal)   Ht $R'5\' 4"'jL$  (1.626 m)   Wt 195 lb (88.5 kg)   SpO2 95%   BMI 33.47 kg/m  Wt Readings from Last 3 Encounters:  11/16/20 195 lb (88.5 kg)  10/28/20 193 lb 9.6 oz (87.8 kg)  10/26/20 191 lb 12.8 oz (87 kg)    Health Maintenance Due  Topic Date Due   COVID-19 Vaccine (4 - Booster for Moderna series) 01/22/2020    There are no preventive care reminders to display for this patient.   Lab Results  Component Value Date   TSH 1.580 06/10/2020   Lab Results  Component Value Date   WBC 8.0 09/23/2020   HGB 13.7 09/23/2020   HCT 42.2 09/23/2020   MCV 86 09/23/2020   PLT 306 09/23/2020   Lab Results  Component Value Date   NA 137 10/07/2020   K 3.8 10/07/2020   CO2 28 10/07/2020   GLUCOSE 116 (H) 10/07/2020   BUN 13 10/07/2020   CREATININE 0.97 10/07/2020   BILITOT <0.2 09/23/2020   ALKPHOS 82 09/23/2020   AST 20 09/23/2020   ALT 16 09/23/2020   PROT 6.5 09/23/2020   ALBUMIN 4.1 09/23/2020   CALCIUM 9.4 10/07/2020   ANIONGAP 6 10/07/2020   EGFR 61 09/23/2020   Lab Results  Component Value Date   CHOL 144 12/11/2019   Lab Results  Component Value Date   HDL 44 12/11/2019   Lab Results  Component Value Date   LDLCALC 82 12/11/2019   Lab Results  Component Value Date   TRIG 98 12/11/2019   Lab Results  Component Value Date   CHOLHDL 3.3 12/11/2019   No results found for: HGBA1C     Assessment & Plan:   Problem List  Items Addressed This Visit       Nervous and Auditory   Back pain with sciatica - Primary    Uncontrolled lower back pain with sciatica. Pain worsened in the last 1-2 weeks, patient is unable to tolerate antiinflammatory, started patient on Robaxin buy mouth as needed for muscle spasms, tramadol short term.   Education provided to patient  with printed hand out given, follow up with worsening unresolved symptoms.   RX sent to pharmacy.      Relevant Medications   methocarbamol (ROBAXIN) 500 MG tablet   traMADol (ULTRAM) 50 MG tablet     Meds ordered this encounter  Medications   methocarbamol (ROBAXIN) 500 MG tablet    Sig: Take 1 tablet (500 mg total) by mouth 4 (four) times daily.    Dispense:  60 tablet    Refill:  0    Order Specific Question:   Supervising Provider    Answer:   Claretta Fraise [[id number]]   traMADol (ULTRAM) 50 MG tablet    Sig: Take 1 tablet (50 mg total) by mouth every 8 (eight) hours as needed for up to 5 days.    Dispense:  15 tablet    Refill:  0    Order Specific Question:   Supervising Provider    Answer:   Claretta Fraise [phone]  Ivy Lynn, NP

## 2020-11-16 NOTE — Assessment & Plan Note (Signed)
Uncontrolled lower back pain with sciatica. Pain worsened in the last 1-2 weeks, patient is unable to tolerate antiinflammatory, started patient on Robaxin buy mouth as needed for muscle spasms, tramadol short term.   Education provided to patient  with printed hand out given, follow up with worsening unresolved symptoms.   RX sent to pharmacy.

## 2020-11-16 NOTE — Patient Instructions (Signed)

## 2020-11-19 DIAGNOSIS — Z9011 Acquired absence of right breast and nipple: Secondary | ICD-10-CM | POA: Diagnosis not present

## 2020-11-19 DIAGNOSIS — D0511 Intraductal carcinoma in situ of right breast: Secondary | ICD-10-CM | POA: Diagnosis not present

## 2020-11-19 DIAGNOSIS — I10 Essential (primary) hypertension: Secondary | ICD-10-CM | POA: Diagnosis not present

## 2020-11-19 DIAGNOSIS — Z87891 Personal history of nicotine dependence: Secondary | ICD-10-CM | POA: Diagnosis not present

## 2020-11-19 DIAGNOSIS — Z803 Family history of malignant neoplasm of breast: Secondary | ICD-10-CM | POA: Diagnosis not present

## 2020-11-19 DIAGNOSIS — Z510 Encounter for antineoplastic radiation therapy: Secondary | ICD-10-CM | POA: Diagnosis not present

## 2020-11-22 ENCOUNTER — Encounter: Payer: Self-pay | Admitting: *Deleted

## 2020-11-25 DIAGNOSIS — Z510 Encounter for antineoplastic radiation therapy: Secondary | ICD-10-CM | POA: Diagnosis not present

## 2020-11-25 DIAGNOSIS — Z87891 Personal history of nicotine dependence: Secondary | ICD-10-CM | POA: Diagnosis not present

## 2020-11-25 DIAGNOSIS — Z803 Family history of malignant neoplasm of breast: Secondary | ICD-10-CM | POA: Diagnosis not present

## 2020-11-25 DIAGNOSIS — D0511 Intraductal carcinoma in situ of right breast: Secondary | ICD-10-CM | POA: Diagnosis not present

## 2020-11-25 DIAGNOSIS — I10 Essential (primary) hypertension: Secondary | ICD-10-CM | POA: Diagnosis not present

## 2020-11-25 DIAGNOSIS — Z9011 Acquired absence of right breast and nipple: Secondary | ICD-10-CM | POA: Diagnosis not present

## 2020-11-29 DIAGNOSIS — D0511 Intraductal carcinoma in situ of right breast: Secondary | ICD-10-CM | POA: Diagnosis not present

## 2020-11-29 DIAGNOSIS — Z87891 Personal history of nicotine dependence: Secondary | ICD-10-CM | POA: Diagnosis not present

## 2020-11-29 DIAGNOSIS — Z510 Encounter for antineoplastic radiation therapy: Secondary | ICD-10-CM | POA: Diagnosis not present

## 2020-11-29 DIAGNOSIS — I10 Essential (primary) hypertension: Secondary | ICD-10-CM | POA: Diagnosis not present

## 2020-11-29 DIAGNOSIS — Z9011 Acquired absence of right breast and nipple: Secondary | ICD-10-CM | POA: Diagnosis not present

## 2020-11-29 DIAGNOSIS — Z803 Family history of malignant neoplasm of breast: Secondary | ICD-10-CM | POA: Diagnosis not present

## 2020-11-30 DIAGNOSIS — D0511 Intraductal carcinoma in situ of right breast: Secondary | ICD-10-CM | POA: Diagnosis not present

## 2020-11-30 DIAGNOSIS — I10 Essential (primary) hypertension: Secondary | ICD-10-CM | POA: Diagnosis not present

## 2020-11-30 DIAGNOSIS — Z87891 Personal history of nicotine dependence: Secondary | ICD-10-CM | POA: Diagnosis not present

## 2020-11-30 DIAGNOSIS — Z803 Family history of malignant neoplasm of breast: Secondary | ICD-10-CM | POA: Diagnosis not present

## 2020-11-30 DIAGNOSIS — Z9011 Acquired absence of right breast and nipple: Secondary | ICD-10-CM | POA: Diagnosis not present

## 2020-11-30 DIAGNOSIS — Z510 Encounter for antineoplastic radiation therapy: Secondary | ICD-10-CM | POA: Diagnosis not present

## 2020-12-01 DIAGNOSIS — D0511 Intraductal carcinoma in situ of right breast: Secondary | ICD-10-CM | POA: Diagnosis not present

## 2020-12-01 DIAGNOSIS — Z510 Encounter for antineoplastic radiation therapy: Secondary | ICD-10-CM | POA: Diagnosis not present

## 2020-12-01 DIAGNOSIS — Z803 Family history of malignant neoplasm of breast: Secondary | ICD-10-CM | POA: Diagnosis not present

## 2020-12-01 DIAGNOSIS — Z87891 Personal history of nicotine dependence: Secondary | ICD-10-CM | POA: Diagnosis not present

## 2020-12-01 DIAGNOSIS — Z9011 Acquired absence of right breast and nipple: Secondary | ICD-10-CM | POA: Diagnosis not present

## 2020-12-01 DIAGNOSIS — I10 Essential (primary) hypertension: Secondary | ICD-10-CM | POA: Diagnosis not present

## 2020-12-02 DIAGNOSIS — Z9011 Acquired absence of right breast and nipple: Secondary | ICD-10-CM | POA: Diagnosis not present

## 2020-12-02 DIAGNOSIS — D0511 Intraductal carcinoma in situ of right breast: Secondary | ICD-10-CM | POA: Diagnosis not present

## 2020-12-02 DIAGNOSIS — Z803 Family history of malignant neoplasm of breast: Secondary | ICD-10-CM | POA: Diagnosis not present

## 2020-12-02 DIAGNOSIS — Z87891 Personal history of nicotine dependence: Secondary | ICD-10-CM | POA: Diagnosis not present

## 2020-12-02 DIAGNOSIS — I10 Essential (primary) hypertension: Secondary | ICD-10-CM | POA: Diagnosis not present

## 2020-12-02 DIAGNOSIS — Z510 Encounter for antineoplastic radiation therapy: Secondary | ICD-10-CM | POA: Diagnosis not present

## 2020-12-03 DIAGNOSIS — Z87891 Personal history of nicotine dependence: Secondary | ICD-10-CM | POA: Diagnosis not present

## 2020-12-03 DIAGNOSIS — Z803 Family history of malignant neoplasm of breast: Secondary | ICD-10-CM | POA: Diagnosis not present

## 2020-12-03 DIAGNOSIS — I10 Essential (primary) hypertension: Secondary | ICD-10-CM | POA: Diagnosis not present

## 2020-12-03 DIAGNOSIS — D0511 Intraductal carcinoma in situ of right breast: Secondary | ICD-10-CM | POA: Diagnosis not present

## 2020-12-03 DIAGNOSIS — Z9011 Acquired absence of right breast and nipple: Secondary | ICD-10-CM | POA: Diagnosis not present

## 2020-12-03 DIAGNOSIS — Z510 Encounter for antineoplastic radiation therapy: Secondary | ICD-10-CM | POA: Diagnosis not present

## 2020-12-06 DIAGNOSIS — Z510 Encounter for antineoplastic radiation therapy: Secondary | ICD-10-CM | POA: Diagnosis not present

## 2020-12-06 DIAGNOSIS — Z87891 Personal history of nicotine dependence: Secondary | ICD-10-CM | POA: Diagnosis not present

## 2020-12-06 DIAGNOSIS — I10 Essential (primary) hypertension: Secondary | ICD-10-CM | POA: Diagnosis not present

## 2020-12-06 DIAGNOSIS — Z803 Family history of malignant neoplasm of breast: Secondary | ICD-10-CM | POA: Diagnosis not present

## 2020-12-06 DIAGNOSIS — D0511 Intraductal carcinoma in situ of right breast: Secondary | ICD-10-CM | POA: Diagnosis not present

## 2020-12-06 DIAGNOSIS — Z9011 Acquired absence of right breast and nipple: Secondary | ICD-10-CM | POA: Diagnosis not present

## 2020-12-07 DIAGNOSIS — I10 Essential (primary) hypertension: Secondary | ICD-10-CM | POA: Diagnosis not present

## 2020-12-07 DIAGNOSIS — Z87891 Personal history of nicotine dependence: Secondary | ICD-10-CM | POA: Diagnosis not present

## 2020-12-07 DIAGNOSIS — D0511 Intraductal carcinoma in situ of right breast: Secondary | ICD-10-CM | POA: Diagnosis not present

## 2020-12-07 DIAGNOSIS — Z510 Encounter for antineoplastic radiation therapy: Secondary | ICD-10-CM | POA: Diagnosis not present

## 2020-12-07 DIAGNOSIS — Z803 Family history of malignant neoplasm of breast: Secondary | ICD-10-CM | POA: Diagnosis not present

## 2020-12-07 DIAGNOSIS — Z9011 Acquired absence of right breast and nipple: Secondary | ICD-10-CM | POA: Diagnosis not present

## 2020-12-08 DIAGNOSIS — D0511 Intraductal carcinoma in situ of right breast: Secondary | ICD-10-CM | POA: Diagnosis not present

## 2020-12-08 DIAGNOSIS — Z510 Encounter for antineoplastic radiation therapy: Secondary | ICD-10-CM | POA: Diagnosis not present

## 2020-12-08 DIAGNOSIS — Z803 Family history of malignant neoplasm of breast: Secondary | ICD-10-CM | POA: Diagnosis not present

## 2020-12-08 DIAGNOSIS — Z87891 Personal history of nicotine dependence: Secondary | ICD-10-CM | POA: Diagnosis not present

## 2020-12-08 DIAGNOSIS — I10 Essential (primary) hypertension: Secondary | ICD-10-CM | POA: Diagnosis not present

## 2020-12-08 DIAGNOSIS — Z9011 Acquired absence of right breast and nipple: Secondary | ICD-10-CM | POA: Diagnosis not present

## 2020-12-09 DIAGNOSIS — Z803 Family history of malignant neoplasm of breast: Secondary | ICD-10-CM | POA: Diagnosis not present

## 2020-12-09 DIAGNOSIS — Z87891 Personal history of nicotine dependence: Secondary | ICD-10-CM | POA: Diagnosis not present

## 2020-12-09 DIAGNOSIS — Z510 Encounter for antineoplastic radiation therapy: Secondary | ICD-10-CM | POA: Diagnosis not present

## 2020-12-09 DIAGNOSIS — I10 Essential (primary) hypertension: Secondary | ICD-10-CM | POA: Diagnosis not present

## 2020-12-09 DIAGNOSIS — Z9011 Acquired absence of right breast and nipple: Secondary | ICD-10-CM | POA: Diagnosis not present

## 2020-12-09 DIAGNOSIS — D0511 Intraductal carcinoma in situ of right breast: Secondary | ICD-10-CM | POA: Diagnosis not present

## 2020-12-10 DIAGNOSIS — Z803 Family history of malignant neoplasm of breast: Secondary | ICD-10-CM | POA: Diagnosis not present

## 2020-12-10 DIAGNOSIS — Z87891 Personal history of nicotine dependence: Secondary | ICD-10-CM | POA: Diagnosis not present

## 2020-12-10 DIAGNOSIS — Z510 Encounter for antineoplastic radiation therapy: Secondary | ICD-10-CM | POA: Diagnosis not present

## 2020-12-10 DIAGNOSIS — Z9011 Acquired absence of right breast and nipple: Secondary | ICD-10-CM | POA: Diagnosis not present

## 2020-12-10 DIAGNOSIS — D0511 Intraductal carcinoma in situ of right breast: Secondary | ICD-10-CM | POA: Diagnosis not present

## 2020-12-10 DIAGNOSIS — I10 Essential (primary) hypertension: Secondary | ICD-10-CM | POA: Diagnosis not present

## 2020-12-13 DIAGNOSIS — Z510 Encounter for antineoplastic radiation therapy: Secondary | ICD-10-CM | POA: Diagnosis not present

## 2020-12-13 DIAGNOSIS — D0511 Intraductal carcinoma in situ of right breast: Secondary | ICD-10-CM | POA: Diagnosis not present

## 2020-12-13 DIAGNOSIS — Z803 Family history of malignant neoplasm of breast: Secondary | ICD-10-CM | POA: Diagnosis not present

## 2020-12-13 DIAGNOSIS — Z9011 Acquired absence of right breast and nipple: Secondary | ICD-10-CM | POA: Diagnosis not present

## 2020-12-13 DIAGNOSIS — I10 Essential (primary) hypertension: Secondary | ICD-10-CM | POA: Diagnosis not present

## 2020-12-13 DIAGNOSIS — Z87891 Personal history of nicotine dependence: Secondary | ICD-10-CM | POA: Diagnosis not present

## 2020-12-14 DIAGNOSIS — I10 Essential (primary) hypertension: Secondary | ICD-10-CM | POA: Diagnosis not present

## 2020-12-14 DIAGNOSIS — Z87891 Personal history of nicotine dependence: Secondary | ICD-10-CM | POA: Diagnosis not present

## 2020-12-14 DIAGNOSIS — Z510 Encounter for antineoplastic radiation therapy: Secondary | ICD-10-CM | POA: Diagnosis not present

## 2020-12-14 DIAGNOSIS — Z803 Family history of malignant neoplasm of breast: Secondary | ICD-10-CM | POA: Diagnosis not present

## 2020-12-14 DIAGNOSIS — Z9011 Acquired absence of right breast and nipple: Secondary | ICD-10-CM | POA: Diagnosis not present

## 2020-12-14 DIAGNOSIS — D0511 Intraductal carcinoma in situ of right breast: Secondary | ICD-10-CM | POA: Diagnosis not present

## 2020-12-15 DIAGNOSIS — D0511 Intraductal carcinoma in situ of right breast: Secondary | ICD-10-CM | POA: Diagnosis not present

## 2020-12-15 DIAGNOSIS — I10 Essential (primary) hypertension: Secondary | ICD-10-CM | POA: Diagnosis not present

## 2020-12-15 DIAGNOSIS — Z87891 Personal history of nicotine dependence: Secondary | ICD-10-CM | POA: Diagnosis not present

## 2020-12-15 DIAGNOSIS — Z803 Family history of malignant neoplasm of breast: Secondary | ICD-10-CM | POA: Diagnosis not present

## 2020-12-15 DIAGNOSIS — Z9011 Acquired absence of right breast and nipple: Secondary | ICD-10-CM | POA: Diagnosis not present

## 2020-12-15 DIAGNOSIS — Z510 Encounter for antineoplastic radiation therapy: Secondary | ICD-10-CM | POA: Diagnosis not present

## 2020-12-20 ENCOUNTER — Ambulatory Visit: Payer: Medicare Other | Admitting: Family

## 2020-12-20 DIAGNOSIS — Z9011 Acquired absence of right breast and nipple: Secondary | ICD-10-CM | POA: Diagnosis not present

## 2020-12-20 DIAGNOSIS — Z510 Encounter for antineoplastic radiation therapy: Secondary | ICD-10-CM | POA: Diagnosis not present

## 2020-12-20 DIAGNOSIS — I10 Essential (primary) hypertension: Secondary | ICD-10-CM | POA: Diagnosis not present

## 2020-12-20 DIAGNOSIS — Z803 Family history of malignant neoplasm of breast: Secondary | ICD-10-CM | POA: Diagnosis not present

## 2020-12-20 DIAGNOSIS — D0511 Intraductal carcinoma in situ of right breast: Secondary | ICD-10-CM | POA: Diagnosis not present

## 2020-12-20 DIAGNOSIS — Z87891 Personal history of nicotine dependence: Secondary | ICD-10-CM | POA: Diagnosis not present

## 2020-12-21 ENCOUNTER — Ambulatory Visit (INDEPENDENT_AMBULATORY_CARE_PROVIDER_SITE_OTHER): Payer: Medicare Other

## 2020-12-21 VITALS — Ht 64.0 in | Wt 190.0 lb

## 2020-12-21 DIAGNOSIS — Z87891 Personal history of nicotine dependence: Secondary | ICD-10-CM | POA: Diagnosis not present

## 2020-12-21 DIAGNOSIS — I10 Essential (primary) hypertension: Secondary | ICD-10-CM | POA: Diagnosis not present

## 2020-12-21 DIAGNOSIS — Z9011 Acquired absence of right breast and nipple: Secondary | ICD-10-CM | POA: Diagnosis not present

## 2020-12-21 DIAGNOSIS — Z0000 Encounter for general adult medical examination without abnormal findings: Secondary | ICD-10-CM | POA: Diagnosis not present

## 2020-12-21 DIAGNOSIS — D0511 Intraductal carcinoma in situ of right breast: Secondary | ICD-10-CM | POA: Diagnosis not present

## 2020-12-21 DIAGNOSIS — Z803 Family history of malignant neoplasm of breast: Secondary | ICD-10-CM | POA: Diagnosis not present

## 2020-12-21 DIAGNOSIS — Z510 Encounter for antineoplastic radiation therapy: Secondary | ICD-10-CM | POA: Diagnosis not present

## 2020-12-21 NOTE — Progress Notes (Signed)
Subjective:   Laura James is a 70 y.o. female who presents for Medicare Annual (Subsequent) preventive examination.  Virtual Visit via Telephone Note  I connected with  Laura James on 12/21/20 at  2:00 PM EST by telephone and verified that I am speaking with the correct person using two identifiers.  Location: Patient: Home Provider: [facility] Persons participating in the virtual visit: patient/Nurse Health Advisor   I discussed the limitations, risks, security and privacy concerns of performing an evaluation and management service by telephone and the availability of in person appointments. The patient expressed understanding and agreed to proceed.  Interactive audio and video telecommunications were attempted between this nurse and patient, however failed, due to patient having technical difficulties OR patient did not have access to video capability.  We continued and completed visit with audio only.  Some vital signs may be absent or patient reported.   Rettie Laird E Kristain Hu, LPN   Review of Systems     Cardiac Risk Factors include: advanced age (>23men, >62 women);sedentary lifestyle;obesity (BMI >30kg/m2);hypertension;dyslipidemia;Other (see comment), Risk factor comments: OSA on CPAP     Objective:    Today's Vitals   12/21/20 1402  Weight: 190 lb (86.2 kg)  Height: 5\' 4"  (1.626 m)   Body mass index is 32.61 kg/m.  Advanced Directives 12/21/2020 10/26/2020 10/13/2020 10/04/2020 12/17/2019 12/11/2018 11/01/2017  Does Patient Have a Medical Advance Directive? Yes No No No Yes No No  Type of Paramedic of [place];Living will - - - [place];Living will - -  Does patient want to make changes to medical advance directive? - - - - No - Patient declined - -  Copy of [place] in Chart? No - copy requested - - - No - copy requested - -  Would patient like information on creating a medical advance directive? - No - Patient  declined Yes (MAU/Ambulatory/Procedural Areas - Information given) No - Patient declined - Yes (MAU/Ambulatory/Procedural Areas - Information given);Yes (ED - Information included in AVS) No - Patient declined    Current Medications (verified) Outpatient Encounter Medications as of 12/21/2020  Medication Sig   albuterol (PROAIR HFA) 108 (90 Base) MCG/ACT inhaler INHALE 2 PUFFS BY MOUTH EVERY 6 HOURS AS NEEDED FOR WHEEZE OR SHORTNESS OF BREATH   aspirin 81 MG tablet Take 81 mg by mouth daily.   atenolol (TENORMIN) 25 MG tablet Take 0.5 tablets (12.5 mg total) by mouth daily. (Needs to be seen before next refill)   atorvastatin (LIPITOR) 10 MG tablet Take 1 tablet (10 mg total) by mouth daily.   cetirizine (ZYRTEC) 10 MG tablet Take 10 mg by mouth daily as needed.    cholecalciferol (VITAMIN D) 1000 units tablet Take 1 tablet (1,000 Units total) by mouth daily.   Cyanocobalamin (B-12) 3000 MCG CAPS Take 1 capsule by mouth daily.   docusate sodium (COLACE) 100 MG capsule Take 100 mg by mouth daily as needed for mild constipation.   fluticasone (FLONASE) 50 MCG/ACT nasal spray Place 2 sprays into both nostrils daily.   hydrochlorothiazide (HYDRODIURIL) 25 MG tablet Take 1 tablet (25 mg total) by mouth daily.   Iron, Ferrous Sulfate, 325 (65 Fe) MG TABS Take 325 mg by mouth 2 (two) times daily with a meal.   magnesium oxide (MAG-OX) 400 MG tablet Take 400 mg by mouth daily.   meloxicam (MOBIC) 7.5 MG tablet Take 1 tablet (7.5 mg total) by mouth daily.   methocarbamol (ROBAXIN)  500 MG tablet Take 1 tablet (500 mg total) by mouth 4 (four) times daily.   omeprazole (PRILOSEC) 20 MG capsule Take 1 capsule (20 mg total) by mouth daily.   No facility-administered encounter medications on file as of 12/21/2020.    Allergies (verified) Adhesive [tape] and Latex   History: Past Medical History:  Diagnosis Date   Barrett esophagus    Benign neoplasm of other and unspecified site of the digestive  system 04/29/2007   Breast cancer ([place]) 10/13/2020   Chronic bronchitis (HCC)    Depression    Diverticulosis of colon (without mention of hemorrhage)    Esophageal reflux    Essential hypertension, benign    Gastritis    Grave's disease    Hiatal hernia    Hypothyroidism    IBS (irritable bowel syndrome)    Obesity    Osteopenia    Sleep apnea    Symptomatic menopausal or female climacteric states    Past Surgical History:  Procedure Laterality Date   ABDOMINAL HYSTERECTOMY N/A    BREAST LUMPECTOMY WITH RADIOACTIVE SEED LOCALIZATION Right 10/13/2020   Procedure: RIGHT BREAST LUMPECTOMY WITH RADIOACTIVE SEED LOCALIZATION;  Surgeon: Rolm Bookbinder, MD;  Location: [facility];  Service: General;  Laterality: Right;   CHOLECYSTECTOMY     INCONTINENCE SURGERY     TONSILLECTOMY     Family History  Problem Relation Age of Onset   Hypertension Mother    Arthritis Mother    Osteoporosis Mother    Kidney disease Mother        related to APAP use   Lung cancer Father    Hypertension Brother    Hypertension Daughter    Diabetes Daughter    Diabetes Son    Diabetes Other    Clotting disorder Other    Colon cancer Neg Hx    Social History   Socioeconomic History   Marital status: Married    Spouse name: Francee Piccolo   Number of children: 3   Years of education: 16   Highest education level: Master's degree (e.g., MA, MS, MEng, MEd, MSW, MBA)  Occupational History   Occupation: Hospice- Chaplain    Comment: [place]   Occupation: retired  Tobacco Use   Smoking status: Former    Packs/day: 0.50    Years: 5.00    Pack years: 2.50    Types: Cigarettes    Quit date: 01/24/1968    Years since quitting: 52.9   Smokeless tobacco: Never  Vaping Use   Vaping Use: Never used  Substance and Sexual Activity   Alcohol use: No    Alcohol/week: 0.0 standard drinks   Drug use: No   Sexual activity: Yes  Other Topics Concern   Not on file  Social History  Narrative   Lives at home with husband    Social Determinants of Health   Financial Resource Strain: Low Risk    Difficulty of Paying Living Expenses: Not hard at all  Food Insecurity: No Food Insecurity   Worried About Charity fundraiser in the Last Year: Never true   Ran Out of Food in the Last Year: Never true  Transportation Needs: No Transportation Needs   Lack of Transportation (Medical): No   Lack of Transportation (Non-Medical): No  Physical Activity: Insufficiently Active   Days of Exercise per Week: 4 days   Minutes of Exercise per Session: 20 min  Stress: No Stress Concern Present   Feeling of Stress : Only a little  Social Connections: Engineer, building services of Communication with Friends and Family: More than three times a week   Frequency of Social Gatherings with Friends and Family: More than three times a week   Attends Religious Services: More than 4 times per year   Active Member of [employer] or Organizations: Yes   Attends Music therapist: More than 4 times per year   Marital Status: Married    Tobacco Counseling Counseling given: Not Answered   Clinical Intake:  Pre-visit preparation completed: Yes  Pain : No/denies pain     BMI - recorded: 32.61 Nutritional Status: BMI > 30  Obese Nutritional Risks: None Diabetes: No  How often do you need to have someone help you when you read instructions, pamphlets, or other written materials from your doctor or pharmacy?: 1 - Never  Diabetic? no  Interpreter Needed?: No  Information entered by :: Eugune Sine, LPN   Activities of Daily Living In your present state of health, do you have any difficulty performing the following activities: 12/21/2020 10/13/2020  Hearing? N N  Vision? N N  Difficulty concentrating or making decisions? N N  Walking or climbing stairs? N N  Dressing or bathing? N N  Doing errands, shopping? N -  Preparing Food and eating ? N -  Using the Toilet? N -   In the past six months, have you accidently leaked urine? N -  Comment not since she got bladder mesh -  Do you have problems with loss of bowel control? N -  Managing your Medications? N -  Managing your Finances? N -  Housekeeping or managing your Housekeeping? N -  Some recent data might be hidden    Patient Care Team: Sharion Balloon, FNP as PCP - General (Family Medicine) Mauro Kaufmann, RN as Oncology Nurse Navigator Rockwell Germany, RN as Oncology Nurse Navigator  Indicate any recent Medical Services you may have received from other than [facility] providers in the past year (date may be approximate).     Assessment:   This is a routine wellness examination for [place].  Hearing/Vision screen Hearing Screening - Comments:: Denies hearing difficulties  Vision Screening - Comments:: Wears rx glasses - up to date with annual eye exams with Dr Warden Fillers  Dietary issues and exercise activities discussed: Current Exercise Habits: Home exercise routine, Type of exercise: walking, Time (Minutes): 20, Frequency (Times/Week): 4, Weekly Exercise (Minutes/Week): 80, Intensity: Mild, Exercise limited by: orthopedic condition(s);respiratory conditions(s)   Goals Addressed             This Visit's Progress    Exercise 3x per week (30 min per time)   On track    Increase walking to 3 times per week for 30 minutes each session       Depression Screen PHQ 2/9 Scores 12/21/2020 11/16/2020 09/23/2020 06/10/2020 12/17/2019 12/11/2019 04/07/2019  PHQ - 2 Score 1 1 0 3 0 0 0  PHQ- 9 Score - - 3 8 - - -    Fall Risk Fall Risk  12/21/2020 11/16/2020 09/23/2020 06/10/2020 12/17/2019  Falls in the past year? 0 0 0 0 0  Number falls in past yr: 0 - - - 0  Injury with Fall? 0 - - - 0  Risk for fall due to : Orthopedic patient - - - No Fall Risks  Follow up Falls prevention discussed - - - Falls evaluation completed    [place]:  Any stairs in or  around the home? Yes  If so, are there any without handrails? No  Home free of loose throw rugs in walkways, pet beds, electrical cords, etc? Yes  Adequate lighting in your home to reduce risk of falls? Yes   ASSISTIVE DEVICES UTILIZED TO PREVENT FALLS:  Life alert? No  Use of a cane, walker or w/c? No  Grab bars in the bathroom? No  Shower chair or bench in shower? No  Elevated toilet seat or a handicapped toilet? Yes   TIMED UP AND GO:  Was the test performed? No . Telephonic visit.  Cognitive Function: Normal cognitive status assessed by direct observation by this Nurse Health Advisor. No abnormalities found.    MMSE - Mini Mental State Exam 11/01/2017 08/02/2016  Orientation to time 5 5  Orientation to Place 5 5  Registration 3 3  Attention/ Calculation 5 5  Recall 3 3  Language- name 2 objects 2 2  Language- repeat 1 1  Language- follow 3 step command 3 3  Language- read & follow direction 1 1  Write a sentence 1 1  Copy design 1 1  Total score 30 30     6CIT Screen 12/17/2019 12/11/2018  What Year? 0 points 0 points  What month? 0 points 0 points  What time? 0 points 0 points  Count back from 20 0 points 0 points  Months in reverse 0 points 0 points  Repeat phrase 0 points 0 points  Total Score 0 0    Immunizations Immunization History  Administered Date(s) Administered   Fluad Quad(high Dose 65+) 11/05/2018, 12/11/2019, 11/16/2020   Influenza, High Dose Seasonal PF 01/19/2016, 11/24/2016   Influenza-Unspecified 11/23/2013   Moderna Sars-Covid-2 Vaccination 03/06/2019, 04/04/2019, 11/27/2019   Pneumococcal Conjugate-13 01/19/2016   Pneumococcal Polysaccharide-23 11/01/2017   Tdap 12/23/2013   Zoster Recombinat (Shingrix) 09/23/2020   Zoster, Live 06/28/2015    TDAP status: Up to date  Flu Vaccine status: Up to date  Pneumococcal vaccine status: Up to date  Covid-19 vaccine status: Completed vaccines  Qualifies for Shingles Vaccine? Yes    Zostavax completed Yes   Shingrix Completed?: No.    Education has been provided regarding the importance of this vaccine. Patient has been advised to call insurance company to determine out of pocket expense if they have not yet received this vaccine. Advised may also receive vaccine at local pharmacy or Health Dept. Verbalized acceptance and understanding.  Screening Tests Health Maintenance  Topic Date Due   COVID-19 Vaccine (4 - Booster for Moderna series) 01/22/2020   Zoster Vaccines- Shingrix (2 of 2) 11/18/2020   COLONOSCOPY (Pts 45-18yrs Insurance coverage will need to be confirmed)  11/29/2020   DEXA SCAN  12/10/2021   MAMMOGRAM  09/14/2022   TETANUS/TDAP  12/24/2023   Pneumonia Vaccine 81+ Years old  Completed   INFLUENZA VACCINE  Completed   Hepatitis C Screening  Completed   HPV VACCINES  Aged Out    Health Maintenance  Health Maintenance Due  Topic Date Due   COVID-19 Vaccine (4 - Booster for Moderna series) 01/22/2020   Zoster Vaccines- Shingrix (2 of 2) 11/18/2020   COLONOSCOPY (Pts 45-93yrs Insurance coverage will need to be confirmed)  11/29/2020    Colorectal cancer screening: Type of screening: Colonoscopy. Completed 11/30/2010. Repeat every 10 years - due now, but going through cancer treatments - wants to wait  Mammogram status: Completed 09/20/2020. Repeat every year  Bone Density status: Completed 12/11/2019. Results reflect:  Bone density results: OSTEOPENIA. Repeat every 2 years.  Lung Cancer Screening: (Low Dose CT Chest recommended if Age 29-80 years, 30 pack-year currently smoking OR have quit w/in 15years.) does not qualify.   Additional Screening:  Hepatitis C Screening: does qualify; Completed 10/12/2016  Vision Screening: Recommended annual ophthalmology exams for early detection of glaucoma and other disorders of the eye. Is the patient up to date with their annual eye exam?  Yes  Who is the provider or what is the name of the office in which  the patient attends annual eye exams? [facility] If pt is not established with a provider, would they like to be referred to a provider to establish care? No .   Dental Screening: Recommended annual dental exams for proper oral hygiene  Community Resource Referral / Chronic Care Management: CRR required this visit?  No   CCM required this visit?  No      Plan:     I have personally reviewed and noted the following in the patient's chart:   Medical and social history Use of alcohol, tobacco or illicit drugs  Current medications and supplements including opioid prescriptions.  Functional ability and status Nutritional status Physical activity Advanced directives List of other physicians Hospitalizations, surgeries, and ER visits in previous 12 months Vitals Screenings to include cognitive, depression, and falls Referrals and appointments  In addition, I have reviewed and discussed with patient certain preventive protocols, quality metrics, and best practice recommendations. A written personalized care plan for preventive services as well as general preventive health recommendations were provided to patient.     Sandrea Hammond, LPN   11/94/1740   Nurse Notes: At last visit, she was advised to take 0.5 tab of atenolol - her BP started going up, so she has been alternating 1 whole tab with 1/2 tab and this is keeping it stable. Also - she is due for a colonoscopy, but isn't sure how long to wait after her cancer treatments are finished.

## 2020-12-21 NOTE — Patient Instructions (Signed)
Ms. Jackowski , Thank you for taking time to come for your Medicare Wellness Visit. I appreciate your ongoing commitment to your health goals. Please review the following plan we discussed and let me know if I can assist you in the future.   Screening recommendations/referrals: Colonoscopy: done 11/30/2010 - Repeat in 10 years *due Mammogram: Done 09/20/2020 - repeat as recommended Bone Density: Done 12/11/2019 - Repeat every 2 years  Recommended yearly ophthalmology/optometry visit for glaucoma screening and checkup Recommended yearly dental visit for hygiene and checkup  Vaccinations: Influenza vaccine: Done 11/16/2020 - Repeat annually  Pneumococcal vaccine: Done 01/19/2016 & 11/01/2017 Tdap vaccine: Done 12/23/2013 - Repeat in 10 years Shingles vaccine: Done 09/23/2020 - due for second dose   Covid-19:Done 03/06/2019, 04/04/2019, & 11/27/2019  Advanced directives: Please bring a copy of your health care power of attorney and living will to the office to be added to your chart at your convenience.   Conditions/risks identified: Aim for 30 minutes of exercise or brisk walking each day, drink 6-8 glasses of water and eat lots of fruits and vegetables.   Next appointment: Follow up in one year for your annual wellness visit    Preventive Care 65 Years and Older, Female Preventive care refers to lifestyle choices and visits with your health care provider that can promote health and wellness. What does preventive care include? A yearly physical exam. This is also called an annual well check. Dental exams once or twice a year. Routine eye exams. Ask your health care provider how often you should have your eyes checked. Personal lifestyle choices, including: Daily care of your teeth and gums. Regular physical activity. Eating a healthy diet. Avoiding tobacco and drug use. Limiting alcohol use. Practicing safe sex. Taking low-dose aspirin every day. Taking vitamin and mineral supplements as  recommended by your health care provider. What happens during an annual well check? The services and screenings done by your health care provider during your annual well check will depend on your age, overall health, lifestyle risk factors, and family history of disease. Counseling  Your health care provider may ask you questions about your: Alcohol use. Tobacco use. Drug use. Emotional well-being. Home and relationship well-being. Sexual activity. Eating habits. History of falls. Memory and ability to understand (cognition). Work and work Statistician. Reproductive health. Screening  You may have the following tests or measurements: Height, weight, and BMI. Blood pressure. Lipid and cholesterol levels. These may be checked every 5 years, or more frequently if you are over 4 years old. Skin check. Lung cancer screening. You may have this screening every year starting at age 59 if you have a 30-pack-year history of smoking and currently smoke or have quit within the past 15 years. Fecal occult blood test (FOBT) of the stool. You may have this test every year starting at age 56. Flexible sigmoidoscopy or colonoscopy. You may have a sigmoidoscopy every 5 years or a colonoscopy every 10 years starting at age 11. Hepatitis C blood test. Hepatitis B blood test. Sexually transmitted disease (STD) testing. Diabetes screening. This is done by checking your blood sugar (glucose) after you have not eaten for a while (fasting). You may have this done every 1-3 years. Bone density scan. This is done to screen for osteoporosis. You may have this done starting at age 51. Mammogram. This may be done every 1-2 years. Talk to your health care provider about how often you should have regular mammograms. Talk with your health care provider about your  test results, treatment options, and if necessary, the need for more tests. Vaccines  Your health care provider may recommend certain vaccines, such  as: Influenza vaccine. This is recommended every year. Tetanus, diphtheria, and acellular pertussis (Tdap, Td) vaccine. You may need a Td booster every 10 years. Zoster vaccine. You may need this after age 76. Pneumococcal 13-valent conjugate (PCV13) vaccine. One dose is recommended after age 24. Pneumococcal polysaccharide (PPSV23) vaccine. One dose is recommended after age 20. Talk to your health care provider about which screenings and vaccines you need and how often you need them. This information is not intended to replace advice given to you by your health care provider. Make sure you discuss any questions you have with your health care provider. Document Released: 02/05/2015 Document Revised: 09/29/2015 Document Reviewed: 11/10/2014 Elsevier Interactive Patient Education  2017 [employer] Prevention in the Home Falls can cause injuries. They can happen to people of all ages. There are many things you can do to make your home safe and to help prevent falls. What can I do on the outside of my home? Regularly fix the edges of walkways and driveways and fix any cracks. Remove anything that might make you trip as you walk through a door, such as a raised step or threshold. Trim any bushes or trees on the path to your home. Use bright outdoor lighting. Clear any walking paths of anything that might make someone trip, such as rocks or tools. Regularly check to see if handrails are loose or broken. Make sure that both sides of any steps have handrails. Any raised decks and porches should have guardrails on the edges. Have any leaves, snow, or ice cleared regularly. Use sand or salt on walking paths during winter. Clean up any spills in your garage right away. This includes oil or grease spills. What can I do in the bathroom? Use night lights. Install grab bars by the toilet and in the tub and shower. Do not use towel bars as grab bars. Use non-skid mats or decals in the tub or  shower. If you need to sit down in the shower, use a plastic, non-slip stool. Keep the floor dry. Clean up any water that spills on the floor as soon as it happens. Remove soap buildup in the tub or shower regularly. Attach bath mats securely with double-sided non-slip rug tape. Do not have throw rugs and other things on the floor that can make you trip. What can I do in the bedroom? Use night lights. Make sure that you have a light by your bed that is easy to reach. Do not use any sheets or blankets that are too big for your bed. They should not hang down onto the floor. Have a firm chair that has side arms. You can use this for support while you get dressed. Do not have throw rugs and other things on the floor that can make you trip. What can I do in the kitchen? Clean up any spills right away. Avoid walking on wet floors. Keep items that you use a lot in easy-to-reach places. If you need to reach something above you, use a strong step stool that has a grab bar. Keep electrical cords out of the way. Do not use floor polish or wax that makes floors slippery. If you must use wax, use non-skid floor wax. Do not have throw rugs and other things on the floor that can make you trip. What can I do with my  stairs? Do not leave any items on the stairs. Make sure that there are handrails on both sides of the stairs and use them. Fix handrails that are broken or loose. Make sure that handrails are as long as the stairways. Check any carpeting to make sure that it is firmly attached to the stairs. Fix any carpet that is loose or worn. Avoid having throw rugs at the top or bottom of the stairs. If you do have throw rugs, attach them to the floor with carpet tape. Make sure that you have a light switch at the top of the stairs and the bottom of the stairs. If you do not have them, ask someone to add them for you. What else can I do to help prevent falls? Wear shoes that: Do not have high heels. Have  rubber bottoms. Are comfortable and fit you well. Are closed at the toe. Do not wear sandals. If you use a stepladder: Make sure that it is fully opened. Do not climb a closed stepladder. Make sure that both sides of the stepladder are locked into place. Ask someone to hold it for you, if possible. Clearly mark and make sure that you can see: Any grab bars or handrails. First and last steps. Where the edge of each step is. Use tools that help you move around (mobility aids) if they are needed. These include: Canes. Walkers. Scooters. Crutches. Turn on the lights when you go into a dark area. Replace any light bulbs as soon as they burn out. Set up your furniture so you have a clear path. Avoid moving your furniture around. If any of your floors are uneven, fix them. If there are any pets around you, be aware of where they are. Review your medicines with your doctor. Some medicines can make you feel dizzy. This can increase your chance of falling. Ask your doctor what other things that you can do to help prevent falls. This information is not intended to replace advice given to you by your health care provider. Make sure you discuss any questions you have with your health care provider. Document Released: 11/05/2008 Document Revised: 06/17/2015 Document Reviewed: 02/13/2014 Elsevier Interactive Patient Education  2017 [employer].

## 2020-12-22 DIAGNOSIS — Z87891 Personal history of nicotine dependence: Secondary | ICD-10-CM | POA: Diagnosis not present

## 2020-12-22 DIAGNOSIS — Z510 Encounter for antineoplastic radiation therapy: Secondary | ICD-10-CM | POA: Diagnosis not present

## 2020-12-22 DIAGNOSIS — Z803 Family history of malignant neoplasm of breast: Secondary | ICD-10-CM | POA: Diagnosis not present

## 2020-12-22 DIAGNOSIS — I10 Essential (primary) hypertension: Secondary | ICD-10-CM | POA: Diagnosis not present

## 2020-12-22 DIAGNOSIS — D0511 Intraductal carcinoma in situ of right breast: Secondary | ICD-10-CM | POA: Diagnosis not present

## 2020-12-22 DIAGNOSIS — Z9011 Acquired absence of right breast and nipple: Secondary | ICD-10-CM | POA: Diagnosis not present

## 2020-12-23 ENCOUNTER — Encounter: Payer: Self-pay | Admitting: Family

## 2020-12-23 ENCOUNTER — Ambulatory Visit (INDEPENDENT_AMBULATORY_CARE_PROVIDER_SITE_OTHER): Payer: Medicare Other | Admitting: Family

## 2020-12-23 VITALS — BP 112/72 | HR 65 | Temp 97.7°F | Ht 64.0 in | Wt 191.8 lb

## 2020-12-23 DIAGNOSIS — M5430 Sciatica, unspecified side: Secondary | ICD-10-CM | POA: Diagnosis not present

## 2020-12-23 DIAGNOSIS — I10 Essential (primary) hypertension: Secondary | ICD-10-CM | POA: Diagnosis not present

## 2020-12-23 DIAGNOSIS — Z1211 Encounter for screening for malignant neoplasm of colon: Secondary | ICD-10-CM | POA: Diagnosis not present

## 2020-12-23 DIAGNOSIS — E785 Hyperlipidemia, unspecified: Secondary | ICD-10-CM

## 2020-12-23 DIAGNOSIS — J309 Allergic rhinitis, unspecified: Secondary | ICD-10-CM

## 2020-12-23 DIAGNOSIS — M159 Polyosteoarthritis, unspecified: Secondary | ICD-10-CM | POA: Diagnosis not present

## 2020-12-23 DIAGNOSIS — M549 Dorsalgia, unspecified: Secondary | ICD-10-CM

## 2020-12-23 DIAGNOSIS — D0511 Intraductal carcinoma in situ of right breast: Secondary | ICD-10-CM | POA: Diagnosis not present

## 2020-12-23 DIAGNOSIS — K219 Gastro-esophageal reflux disease without esophagitis: Secondary | ICD-10-CM

## 2020-12-23 MED ORDER — ATENOLOL 25 MG PO TABS: 25.0000 mg | ORAL_TABLET | Freq: Every day | ORAL | 1 refills | Status: DC

## 2020-12-23 NOTE — Progress Notes (Signed)
Subjective:    Patient ID: Laura James, female    DOB: 11/16/1950, 70 y.o.   MRN: [id number]  Chief Complaint  Patient presents with   Medical Management of Chronic Issues   Pt presents to the office today for chronic follow up.  PT is followed by Ortho for back pain and bursitis of hip.  She has right breast cancer and had her last XRT yesterday.  Hypertension This is a chronic problem. The current episode started more than 1 year ago. The problem has been resolved since onset. The problem is controlled. Associated symptoms include headaches, malaise/fatigue and shortness of breath. Pertinent negatives include no peripheral edema. Risk factors for coronary artery disease include dyslipidemia and obesity. The current treatment provides moderate improvement.  Gastroesophageal Reflux She complains of belching and heartburn. This is a chronic problem. The current episode started more than 1 year ago. The problem occurs occasionally. Risk factors include obesity. She has tried a PPI for the symptoms. The treatment provided moderate relief.  Back Pain This is a chronic problem. The current episode started more than 1 year ago. The problem occurs intermittently. The problem has been waxing and waning since onset. The pain is present in the lumbar spine. The quality of the pain is described as aching. The pain is at a severity of 10/10. The pain is moderate. Associated symptoms include headaches.  Arthritis Presents for follow-up visit. She complains of pain and stiffness. The symptoms have been stable. Affected locations include the left hip and right hip. Her pain is at a severity of 7/10.  Hyperlipidemia This is a chronic problem. The current episode started more than 1 year ago. Associated symptoms include shortness of breath. Current antihyperlipidemic treatment includes statins. The current treatment provides moderate improvement of lipids. Risk factors for coronary artery disease include  dyslipidemia, hypertension, a sedentary lifestyle and post-menopausal.  Sinus Problem This is a new problem. The current episode started more than 1 month ago. The problem has been gradually worsening since onset. There has been no fever. Associated symptoms include congestion, headaches, shortness of breath and sinus pressure. Pertinent negatives include no sneezing. Past treatments include oral decongestants. The treatment provided mild relief.     Review of Systems  Constitutional:  Positive for malaise/fatigue.  HENT:  Positive for congestion and sinus pressure. Negative for sneezing.   Respiratory:  Positive for shortness of breath.   Gastrointestinal:  Positive for heartburn.  Musculoskeletal:  Positive for arthritis, back pain and stiffness.  Neurological:  Positive for headaches.  All other systems reviewed and are negative.     Objective:   Physical Exam Vitals reviewed.  Constitutional:      General: She is not in acute distress.    Appearance: She is well-developed.  HENT:     Head: Normocephalic and atraumatic.     Right Ear: Tympanic membrane normal.     Left Ear: Tympanic membrane normal.  Eyes:     Pupils: Pupils are equal, round, and reactive to light.  Neck:     Thyroid: No thyromegaly.  Cardiovascular:     Rate and Rhythm: Normal rate and regular rhythm.     Heart sounds: Normal heart sounds. No murmur heard. Pulmonary:     Effort: Pulmonary effort is normal. No respiratory distress.     Breath sounds: Normal breath sounds. No wheezing.  Abdominal:     General: Bowel sounds are normal. There is no distension.     Palpations: Abdomen is soft.  Tenderness: There is no abdominal tenderness.  Musculoskeletal:        General: No tenderness. Normal range of motion.     Cervical back: Normal range of motion and neck supple.  Skin:    General: Skin is warm and dry.  Neurological:     Mental Status: She is alert and oriented to person, place, and time.      Cranial Nerves: No cranial nerve deficit.     Deep Tendon Reflexes: Reflexes are normal and symmetric.  Psychiatric:        Behavior: Behavior normal.        Thought Content: Thought content normal.        Judgment: Judgment normal.      BP 112/72   Pulse 65   Temp 97.7 F (36.5 C) (Temporal)   Ht _0  (1.626 m)   Wt 191 lb 12.8 oz (87 kg)   BMI 32.92 kg/m      Assessment & Plan:  Laura James comes in today with chief complaint of Medical Management of Chronic Issues   Diagnosis and orders addressed:  1. Essential hypertension, benign - atenolol (TENORMIN) 25 MG tablet; Take 1 tablet (25 mg total) by mouth daily. (Needs to be seen before next refill)  Dispense: 90 tablet; Refill: 1 - CMP14+EGFR - CBC with Differential/Platelet  2. Colon cancer screening - Ambulatory referral to Gastroenterology - CMP14+EGFR - CBC with Differential/Platelet  3. Gastroesophageal reflux disease without esophagitis - CMP14+EGFR - CBC with Differential/Platelet  4. Back pain with sciatica - CMP14+EGFR - CBC with Differential/Platelet  5. Primary osteoarthritis involving multiple joints - CMP14+EGFR - CBC with Differential/Platelet  6. Hyperlipidemia, unspecified hyperlipidemia type - CMP14+EGFR - CBC with Differential/Platelet  7. Ductal carcinoma in situ (DCIS) of right breast - CMP14+EGFR - CBC with Differential/Platelet  8. Allergic rhinitis, unspecified seasonality, unspecified trigger Pt will start taking zyrtec daily. If not improved she will call and I will send in antibiotic.   Labs pending Health Maintenance reviewed Diet and exercise encouraged  Follow up plan: 6 months    Evelina Dun, FNP

## 2020-12-23 NOTE — Patient Instructions (Signed)
Health Maintenance After Age 72 After age 72, you are at a higher risk for certain long-term diseases and infections as well as injuries from falls. Falls are a major cause of broken bones and head injuries in people who are older than age 72. Getting regular preventive care can help to keep you healthy and well. Preventive care includes getting regular testing and making lifestyle changes as recommended by your health care provider. Talk with your health care provider about: Which screenings and tests you should have. A screening is a test that checks for a disease when you have no symptoms. A diet and exercise plan that is right for you. What should I know about screenings and tests to prevent falls? Screening and testing are the best ways to find a health problem early. Early diagnosis and treatment give you the best chance of managing medical conditions that are common after age 72. Certain conditions and lifestyle choices may make you more likely to have a fall. Your health care provider may recommend: Regular vision checks. Poor vision and conditions such as cataracts can make you more likely to have a fall. If you wear glasses, make sure to get your prescription updated if your vision changes. Medicine review. Work with your health care provider to regularly review all of the medicines you are taking, including over-the-counter medicines. Ask your health care provider about any side effects that may make you more likely to have a fall. Tell your health care provider if any medicines that you take make you feel dizzy or sleepy. Strength and balance checks. Your health care provider may recommend certain tests to check your strength and balance while standing, walking, or changing positions. Foot health exam. Foot pain and numbness, as well as not wearing proper footwear, can make you more likely to have a fall. Screenings, including: Osteoporosis screening. Osteoporosis is a condition that causes  the bones to get weaker and break more easily. Blood pressure screening. Blood pressure changes and medicines to control blood pressure can make you feel dizzy. Depression screening. You may be more likely to have a fall if you have a fear of falling, feel depressed, or feel unable to do activities that you used to do. Alcohol use screening. Using too much alcohol can affect your balance and may make you more likely to have a fall. Follow these instructions at home: Lifestyle Do not drink alcohol if: Your health care provider tells you not to drink. If you drink alcohol: Limit how much you have to: 0-1 drink a day for women. 0-2 drinks a day for men. Know how much alcohol is in your drink. In the [place] one drink equals one 12 oz bottle of beer (355 mL), one 5 oz glass of wine (148 mL), or one 1 oz glass of hard liquor (44 mL). Do not use any products that contain nicotine or tobacco. These products include cigarettes, chewing tobacco, and vaping devices, such as e-cigarettes. If you need help quitting, ask your health care provider. Activity  Follow a regular exercise program to stay fit. This will help you maintain your balance. Ask your health care provider what types of exercise are appropriate for you. If you need a cane or walker, use it as recommended by your health care provider. Wear supportive shoes that have nonskid soles. Safety  Remove any tripping hazards, such as rugs, cords, and clutter. Install safety equipment such as grab bars in bathrooms and safety rails on stairs. Keep rooms and walkways   well-lit. General instructions Talk with your health care provider about your risks for falling. Tell your health care provider if: You fall. Be sure to tell your health care provider about all falls, even ones that seem minor. You feel dizzy, tiredness (fatigue), or off-balance. Take over-the-counter and prescription medicines only as told by your health care provider. These include  supplements. Eat a healthy diet and maintain a healthy weight. A healthy diet includes low-fat dairy products, low-fat (lean) meats, and fiber from whole grains, beans, and lots of fruits and vegetables. Stay current with your vaccines. Schedule regular health, dental, and eye exams. Summary Having a healthy lifestyle and getting preventive care can help to protect your health and wellness after age 72. Screening and testing are the best way to find a health problem early and help you avoid having a fall. Early diagnosis and treatment give you the best chance for managing medical conditions that are more common for people who are older than age 72. Falls are a major cause of broken bones and head injuries in people who are older than age 72. Take precautions to prevent a fall at home. Work with your health care provider to learn what changes you can make to improve your health and wellness and to prevent falls. This information is not intended to replace advice given to you by your health care provider. Make sure you discuss any questions you have with your health care provider. Document Revised: 05/31/2020 Document Reviewed: 05/31/2020 Elsevier Patient Education  2022 [employer]  

## 2020-12-24 LAB — CMP14+EGFR
ALT: 17 IU/L (ref 0–32)
AST: 21 IU/L (ref 0–40)
Albumin/Globulin Ratio: 1.9 (ref 1.2–2.2)
Albumin: 4.1 g/dL (ref 3.7–4.7)
Alkaline Phosphatase: 88 IU/L (ref 44–121)
BUN/Creatinine Ratio: 12 (ref 12–28)
BUN: 13 mg/dL (ref 8–27)
Bilirubin Total: 0.2 mg/dL (ref 0.0–1.2)
CO2: 26 mmol/L (ref 20–29)
Calcium: 9.6 mg/dL (ref 8.7–10.3)
Chloride: 102 mmol/L (ref 96–106)
Creatinine, Ser: 1.06 mg/dL — ABNORMAL HIGH (ref 0.57–1.00)
Globulin, Total: 2.2 g/dL (ref 1.5–4.5)
Glucose: 78 mg/dL (ref 70–99)
Potassium: 4 mmol/L (ref 3.5–5.2)
Sodium: 140 mmol/L (ref 134–144)
Total Protein: 6.3 g/dL (ref 6.0–8.5)
eGFR: 56 mL/min/{1.73_m2} — ABNORMAL LOW (ref >59)

## 2020-12-24 LAB — CBC WITH DIFFERENTIAL/PLATELET
Basophils Absolute: 0 10*3/uL (ref 0.0–0.2)
Basos: 1 %
EOS (ABSOLUTE): 0.3 10*3/uL (ref 0.0–0.4)
Eos: 5 %
Hematocrit: 41.5 % (ref 34.0–46.6)
Hemoglobin: 13.9 g/dL (ref 11.1–15.9)
Immature Grans (Abs): 0 10*3/uL (ref 0.0–0.1)
Immature Granulocytes: 0 %
Lymphocytes Absolute: 1.1 10*3/uL (ref 0.7–3.1)
Lymphs: 17 %
MCH: 29.4 pg (ref 26.6–33.0)
MCHC: 33.5 g/dL (ref 31.5–35.7)
MCV: 88 fL (ref 79–97)
Monocytes Absolute: 0.5 10*3/uL (ref 0.1–0.9)
Monocytes: 8 %
Neutrophils Absolute: 4.5 10*3/uL (ref 1.4–7.0)
Neutrophils: 69 %
Platelets: 265 10*3/uL (ref 150–450)
RBC: 4.73 x10E6/uL (ref 3.77–5.28)
RDW: 14 % (ref 11.7–15.4)
WBC: 6.5 10*3/uL (ref 3.4–10.8)

## 2020-12-25 ENCOUNTER — Other Ambulatory Visit: Payer: Self-pay | Admitting: Family

## 2020-12-25 DIAGNOSIS — K219 Gastro-esophageal reflux disease without esophagitis: Secondary | ICD-10-CM

## 2020-12-31 ENCOUNTER — Telehealth: Payer: Self-pay | Admitting: Family

## 2020-12-31 DIAGNOSIS — G4733 Obstructive sleep apnea (adult) (pediatric): Secondary | ICD-10-CM

## 2020-12-31 DIAGNOSIS — K2950 Unspecified chronic gastritis without bleeding: Secondary | ICD-10-CM

## 2020-12-31 DIAGNOSIS — M549 Dorsalgia, unspecified: Secondary | ICD-10-CM

## 2020-12-31 DIAGNOSIS — M159 Polyosteoarthritis, unspecified: Secondary | ICD-10-CM

## 2020-12-31 DIAGNOSIS — M5430 Sciatica, unspecified side: Secondary | ICD-10-CM

## 2020-12-31 NOTE — Telephone Encounter (Signed)
Pt called about her colonoscopy and endo apt but was told that only a referral for colonoscopy was sent in. She needs a referral for endoscopy too--send to [facility]  Pt wants to know if Lenna Gilford will order a referral for sleep study. Instead of full face she wants to be fitted for nasal canullar.  Pt was going to an orthopedic in [place]. Pt wants to go to [facility] orthopedic for back and hip pain. Pt says that she talked about this will [place] at previous visits.  Pt aware Lenna Gilford out of office and will address Monday.

## 2021-01-04 ENCOUNTER — Other Ambulatory Visit: Payer: Self-pay | Admitting: Family

## 2021-01-04 NOTE — Addendum Note (Signed)
Addended by: Evelina Dun A on: 01/04/2021 01:54 PM   Modules accepted: Orders

## 2021-01-04 NOTE — Telephone Encounter (Signed)
Patient aware and verbalized understanding. °

## 2021-01-04 NOTE — Telephone Encounter (Signed)
She called the GI and they told her they had to have a referral from [place] for the Endo to so they could set it up same day

## 2021-01-04 NOTE — Telephone Encounter (Signed)
I have placed a new referral to Ortho and for a sleep study. For the GI, when she goes to their office to discuss colonoscopy they can discuss EGD too. No need for a referral for that.

## 2021-01-04 NOTE — Telephone Encounter (Signed)
I have placed another referral to GI.   Evelina Dun, FNP

## 2021-01-05 ENCOUNTER — Other Ambulatory Visit: Payer: Self-pay | Admitting: Family

## 2021-01-05 ENCOUNTER — Encounter: Payer: Self-pay | Admitting: Gastroenterology

## 2021-01-05 DIAGNOSIS — G4733 Obstructive sleep apnea (adult) (pediatric): Secondary | ICD-10-CM

## 2021-01-21 DIAGNOSIS — I10 Essential (primary) hypertension: Secondary | ICD-10-CM | POA: Diagnosis not present

## 2021-01-21 DIAGNOSIS — Z87891 Personal history of nicotine dependence: Secondary | ICD-10-CM | POA: Diagnosis not present

## 2021-01-21 DIAGNOSIS — D0511 Intraductal carcinoma in situ of right breast: Secondary | ICD-10-CM | POA: Diagnosis not present

## 2021-01-21 DIAGNOSIS — Z803 Family history of malignant neoplasm of breast: Secondary | ICD-10-CM | POA: Diagnosis not present

## 2021-01-21 DIAGNOSIS — Z510 Encounter for antineoplastic radiation therapy: Secondary | ICD-10-CM | POA: Diagnosis not present

## 2021-01-21 DIAGNOSIS — Z9011 Acquired absence of right breast and nipple: Secondary | ICD-10-CM | POA: Diagnosis not present

## 2021-01-26 NOTE — Progress Notes (Signed)
HEMATOLOGY-ONCOLOGY MYCHART VIDEO VISIT PROGRESS NOTE  I connected with Laura James on 01/27/2021 at 10:30 AM EST by MyChart video conference and verified that I am speaking with the correct person using two identifiers.  I discussed the limitations, risks, security and privacy concerns of performing an evaluation and management service by MyChart and the availability of in person appointments.  I also discussed with the patient that there may be a patient responsible charge related to this service. The patient expressed understanding and agreed to proceed.  Patient's Location: Home Physician Location: Clinic  CHIEF COMPLIANT: Follow-up of right breast DCIS  INTERVAL HISTORY: Laura James is a 71 y.o. female with above-mentioned history of right breast DCIS. She presents via MyChart today for follow-up.  She has tolerated radiation extremely well and is recovered very well from that.  Radiation was completed 12/21/2020 at [facility].  Oncology History  Ductal carcinoma in situ (DCIS) of right breast  10/26/2020 Initial Diagnosis   Screening mammogram: calcifications in the right breast. Diagnostic mammogram and [place]: 1 cm group of indeterminate calcifications within the upper outer right breast.     Surgery   Right lumpectomy: intermediate grade DCIS 0.6 cm with margins uninvolved by carcinoma.  ER 95%, PR 90%     Observations/Objective:    I have reviewed the data as listed CMP Latest Ref Rng & Units 12/23/2020 10/07/2020 09/23/2020  Glucose 70 - 99 mg/dL 78 116(H) 83  BUN 8 - 27 mg/dL 13 13 15   Creatinine 0.57 - 1.00 mg/dL 1.06(H) 0.97 0.99  Sodium 134 - 144 mmol/L 140 137 142  Potassium 3.5 - 5.2 mmol/L 4.0 3.8 3.8  Chloride 96 - 106 mmol/L 102 103 104  CO2 20 - 29 mmol/L 26 28 26   Calcium 8.7 - 10.3 mg/dL 9.6 9.4 9.6  Total Protein 6.0 - 8.5 g/dL 6.3 - 6.5  Total Bilirubin 0.0 - 1.2 mg/dL 0.2 - <0.2  Alkaline Phos 44 - 121 IU/L 88 - 82  AST 0 - 40 IU/L 21 - 20  ALT 0 - 32 IU/L 17 - 16     Lab Results  Component Value Date   WBC 6.5 12/23/2020   HGB 13.9 12/23/2020   HCT 41.5 12/23/2020   MCV 88 12/23/2020   PLT 265 12/23/2020   NEUTROABS 4.5 12/23/2020      Assessment Plan:  Ductal carcinoma in situ (DCIS) of right breast 10/13/2020:Right lumpectomy: intermediate grade DCIS 0.6 cm with margins uninvolved by carcinoma.  ER 95%, PR 90%   Recommendation: 1. Followed by adjuvant radiation therapy (to be done at [facility]) completed 12/21/20 2. Followed by antiestrogen therapy with tamoxifen 5 years to be started 01/27/21  Treatment plan: Tamoxifen started 01/27/21 at 5 mg daily X 1 month, then increase to 10 mg daily.  Tamoxifen counseling: We discussed the risks and benefits of tamoxifen. These include but not limited to insomnia, hot flashes, mood changes, vaginal dryness, and weight gain. Although rare, serious side effects including endometrial cancer, risk of blood clots were also discussed. We strongly believe that the benefits far outweigh the risks. Patient understands these risks and consented to starting treatment. Planned treatment duration is 5 years.  Return to clinic in 3 months for survivorship care plan visit   I discussed the assessment and treatment plan with the patient. The patient was provided an opportunity to ask questions and all were answered. The patient agreed with the plan and demonstrated an understanding of the instructions. The patient was  advised to call back or seek an in-person evaluation if the symptoms worsen or if the condition fails to improve as anticipated.   Total time spent: 22 minutes including face-to-face MyChart video visit time and time spent for planning, charting and coordination of care  Rulon Eisenmenger, MD 01/27/2021  I, Thana Ates am acting as scribe for Nicholas Lose, MD.  I have reviewed the above documentation for accuracy and completeness, and I agree with the above.

## 2021-01-27 ENCOUNTER — Telehealth: Payer: Medicare Other | Admitting: Hematology and Oncology

## 2021-01-27 DIAGNOSIS — D0511 Intraductal carcinoma in situ of right breast: Secondary | ICD-10-CM

## 2021-01-27 MED ORDER — TAMOXIFEN CITRATE 10 MG PO TABS: 10.0000 mg | ORAL_TABLET | Freq: Every day | ORAL | 3 refills | Status: DC

## 2021-01-27 NOTE — Assessment & Plan Note (Signed)
10/13/2020:Right lumpectomy: intermediate grade DCIS 0.6 cm with margins uninvolved by carcinoma.  ER 95%, PR 90% Recommendation: 1. Followed by adjuvant radiation therapy (to be done at [facility]) completed 2. Followed by antiestrogen therapy with tamoxifen 5 years to be started  Treatment plan: Tamoxifen Return to clinic in 3 months for survivorship care plan visit

## 2021-01-28 ENCOUNTER — Encounter: Payer: Self-pay | Admitting: *Deleted

## 2021-01-28 DIAGNOSIS — D0511 Intraductal carcinoma in situ of right breast: Secondary | ICD-10-CM

## 2021-02-08 ENCOUNTER — Encounter: Payer: Self-pay | Admitting: Gastroenterology

## 2021-02-08 ENCOUNTER — Ambulatory Visit (INDEPENDENT_AMBULATORY_CARE_PROVIDER_SITE_OTHER): Payer: Medicare Other | Admitting: Gastroenterology

## 2021-02-08 VITALS — BP 120/72 | HR 68 | Ht 64.0 in | Wt 194.8 lb

## 2021-02-08 DIAGNOSIS — Z1211 Encounter for screening for malignant neoplasm of colon: Secondary | ICD-10-CM | POA: Diagnosis not present

## 2021-02-08 DIAGNOSIS — K31A0 Gastric intestinal metaplasia, unspecified: Secondary | ICD-10-CM

## 2021-02-08 DIAGNOSIS — K589 Irritable bowel syndrome without diarrhea: Secondary | ICD-10-CM | POA: Diagnosis not present

## 2021-02-08 DIAGNOSIS — Z1212 Encounter for screening for malignant neoplasm of rectum: Secondary | ICD-10-CM

## 2021-02-08 MED ORDER — NA SULFATE-K SULFATE-MG SULF 17.5-3.13-1.6 GM/177ML PO SOLN: 1.0000 | Freq: Once | ORAL | 0 refills | Status: AC

## 2021-02-08 MED ORDER — DICYCLOMINE HCL 20 MG PO TABS: 20.0000 mg | ORAL_TABLET | Freq: Four times a day (QID) | ORAL | 3 refills | Status: DC

## 2021-02-08 NOTE — Patient Instructions (Signed)
If you are age 73 or older, your body mass index should be between 23-30. Your Body mass index is 33.44 kg/m. If this is out of the aforementioned range listed, please consider follow up with your Primary Care Provider.  If you are age 43 or younger, your body mass index should be between 19-25. Your Body mass index is 33.44 kg/m. If this is out of the aformentioned range listed, please consider follow up with your Primary Care Provider.   You have been scheduled for an endoscopy and colonoscopy. Please follow the written instructions given to you at your visit today. Please pick up your prep supplies at the pharmacy within the next 1-3 days. If you use inhalers (even only as needed), please bring them with you on the day of your procedure.   We have sent the following medications to your pharmacy for you to pick up at your convenience: Bentyl 20 mg every 6 hours for pain.  Colace increase to twice daily.   The [facility] providers would like to encourage you to use [facility] to communicate with providers for non-urgent requests or questions.  Due to long hold times on the telephone, sending your provider a message by [facility] may be a faster and more efficient way to get a response.  Please allow 48 business hours for a response.  Please remember that this is for non-urgent requests.   It was a pleasure to see you today!  Thank you for trusting me with your gastrointestinal care!    Scott E. Candis Schatz, MD

## 2021-02-08 NOTE — Progress Notes (Signed)
HPI : Laura James is a very pleasant 71 year old female with a history of  breast cancer, OSA, chronic bronchitis and IBS who is referred to [place] by Evelina Dun, FNP for screening colonoscopy.  Her last colonoscopy was in 2012 and was notable only for diverticulosis and internal hemorrhoids, no polyps.  She had a colonoscopy in 2009 which was also negative for polyps.  She has no family history of GI malignancy.  She has been diagnosed with IBS, with symptoms characterized predominantly by constipation with episodes of diarrhea and excruciating/unbearable abdominal pain.  These episodes of pain and diarrhea occur about 1-2x/month and last usually a day or two.  She has noticed that dairy has been a trigger for these episodes, so she avoids dairy products.  She takes a stool softener once daily, but still has some difficulties with hard stools from time to time.  When she tried taking 2 stool softeners, her stools were too loose.  She states she has not tried taking anything for her abdominal pain. She has a history of Barrett's esophagus in the medical record, but upon review of her previous EGDs, it does not appear she has ever had endoscopic findings of Barrett's, but rather has had a polypoid lesion in the GEJ with biopsies showing inflammatory changes and intestinal metaplasia.  Her last EGD was in 2012 and there was an inflammatory polyp in the cardia and numerous small polypoid lesions throughout the body.  Gastric biopsies showed intestinal metaplasia. She reports rare GERD symptoms of heartburn and acid regurgitation.  She has rare mild solid dysphagia, no history of food getting stuck.     Past Medical History:  Diagnosis Date   Barrett esophagus    Benign neoplasm of other and unspecified site of the digestive system 04/29/2007   Breast cancer (Early) 10/13/2020   Chronic bronchitis (HCC)    Depression    Diverticulosis of colon (without mention of hemorrhage)    Esophageal reflux     Essential hypertension, benign    Gastritis    Grave's disease    Hiatal hernia    Hypothyroidism    IBS (irritable bowel syndrome)    Obesity    Osteopenia    Sleep apnea    Symptomatic menopausal or female climacteric states    Colonoscopy 2012:  Normal, diverticulosis, hemorrhoids, no polyps, random biopsies negative for [facility] Colonoscopy 2009: normal EGD 2012:  Mild gastritis and numerous polypoid lesions, inflammatory polyp in cardia:  gastric biopsies with gastritis and intestinal metaplasia EGD: 2009:  HH, gastric antral polyp with focal GIM, inflammatory  polyps in gastric cardia, normal duodenal biopsies EGD: 2003:  Gastric polyp with chronic gastritis, no IM, no dysplasia EGD 2001:  Gastric polyp with esophageal mucosa/intestinal metaplasia EGD: 1998:  Polyp at GEJ, path showed Barrett's esophagus (polypoid) Past Surgical History:  Procedure Laterality Date   ABDOMINAL HYSTERECTOMY N/A    BREAST LUMPECTOMY WITH RADIOACTIVE SEED LOCALIZATION Right 10/13/2020   Procedure: RIGHT BREAST LUMPECTOMY WITH RADIOACTIVE SEED LOCALIZATION;  Surgeon: Rolm Bookbinder, MD;  Location: [place];  Service: General;  Laterality: Right;   CHOLECYSTECTOMY     INCONTINENCE SURGERY     TONSILLECTOMY     Family History  Problem Relation Age of Onset   Hypertension Mother    Arthritis Mother    Osteoporosis Mother    Kidney disease Mother        related to APAP use   Lung cancer Father    Hypertension  Brother    Hypertension Daughter    Diabetes Daughter    Diabetes Son    Diabetes Other    Clotting disorder Other    Colon cancer Neg Hx    Social History   Tobacco Use   Smoking status: Former    Packs/day: 0.50    Years: 5.00    Pack years: 2.50    Types: Cigarettes    Quit date: 01/24/1968    Years since quitting: 53.0   Smokeless tobacco: Never  Vaping Use   Vaping Use: Never used  Substance Use Topics   Alcohol use: No    Alcohol/week: 0.0 standard drinks    Drug use: No   Current Outpatient Medications  Medication Sig Dispense Refill   albuterol (PROAIR HFA) 108 (90 Base) MCG/ACT inhaler INHALE 2 PUFFS BY MOUTH EVERY 6 HOURS AS NEEDED FOR WHEEZE OR SHORTNESS OF BREATH 8.5 each 2   aspirin 81 MG tablet Take 81 mg by mouth daily.     atenolol (TENORMIN) 25 MG tablet Take 1 tablet (25 mg total) by mouth daily. (Needs to be seen before next refill) 90 tablet 1   atorvastatin (LIPITOR) 10 MG tablet Take 1 tablet (10 mg total) by mouth daily. 90 tablet 0   cetirizine (ZYRTEC) 10 MG tablet Take 10 mg by mouth daily as needed.      cholecalciferol (VITAMIN D) 1000 units tablet Take 1 tablet (1,000 Units total) by mouth daily.     Cyanocobalamin (B-12) 3000 MCG CAPS Take 1 capsule by mouth daily.     docusate sodium (COLACE) 100 MG capsule Take 100 mg by mouth daily as needed for mild constipation.     fluticasone (FLONASE) 50 MCG/ACT nasal spray Place 2 sprays into both nostrils daily. 16 g 6   hydrochlorothiazide (HYDRODIURIL) 25 MG tablet Take 1 tablet (25 mg total) by mouth daily. 90 tablet 1   Iron, Ferrous Sulfate, 325 (65 Fe) MG TABS Take 325 mg by mouth 2 (two) times daily with a meal. 180 tablet 5   magnesium oxide (MAG-OX) 400 MG tablet Take 400 mg by mouth daily.     omeprazole (PRILOSEC) 20 MG capsule TAKE 1 CAPSULE BY MOUTH EVERY DAY (Patient taking differently: daily as needed.) 90 capsule 0   tamoxifen (NOLVADEX) 10 MG tablet Take 1 tablet (10 mg total) by mouth daily. 90 tablet 3   No current facility-administered medications for this visit.   Allergies  Allergen Reactions   Adhesive [Tape] Itching and Rash   Latex Rash     Review of Systems: All systems reviewed and negative except where noted in HPI.    No results found.  Physical Exam: BP 120/72    Pulse 68    Ht 5\' 4"  (1.626 m)    Wt 194 lb 12.8 oz (88.4 kg)    BMI 33.44 kg/m  Constitutional: Pleasant,well-developed, African-American female in no acute  distress. HEENT: Normocephalic and atraumatic. Conjunctivae are normal. No scleral icterus. Neck supple.  Cardiovascular: Normal rate, regular rhythm.  Pulmonary/chest: Effort normal and breath sounds normal. No wheezing, rales or rhonchi. Abdominal: Soft, nondistended, nontender. Bowel sounds active throughout. There are no masses palpable. No hepatomegaly. Extremities: no edema Neurological: Alert and oriented to person place and time. Skin: Skin is warm and dry. No rashes noted. Psychiatric: Normal mood and affect. Behavior is normal.  CBC    Component Value Date/Time   WBC 6.5 12/23/2020 1241   WBC 7.6 12/23/2013 1000   WBC  12.2 (H) 02/05/2012 1915   RBC 4.73 12/23/2020 1241   RBC 4.9 12/23/2013 1000   RBC 4.97 02/05/2012 1915   HGB 13.9 12/23/2020 1241   HCT 41.5 12/23/2020 1241   PLT 265 12/23/2020 1241   MCV 88 12/23/2020 1241   MCH 29.4 12/23/2020 1241   MCH 26.9 (A) 12/23/2013 1000   MCH 28.0 02/05/2012 1915   MCHC 33.5 12/23/2020 1241   MCHC 32.1 12/23/2013 1000   MCHC 32.9 02/05/2012 1915   RDW 14.0 12/23/2020 1241   LYMPHSABS 1.1 12/23/2020 1241   MONOABS 0.7 02/05/2012 1915   EOSABS 0.3 12/23/2020 1241   BASOSABS 0.0 12/23/2020 1241    CMP     Component Value Date/Time   NA 140 12/23/2020 1241   K 4.0 12/23/2020 1241   CL 102 12/23/2020 1241   CO2 26 12/23/2020 1241   GLUCOSE 78 12/23/2020 1241   GLUCOSE 116 (H) 10/07/2020 1442   BUN 13 12/23/2020 1241   CREATININE 1.06 (H) 12/23/2020 1241   CALCIUM 9.6 12/23/2020 1241   PROT 6.3 12/23/2020 1241   ALBUMIN 4.1 12/23/2020 1241   AST 21 12/23/2020 1241   ALT 17 12/23/2020 1241   ALKPHOS 88 12/23/2020 1241   BILITOT 0.2 12/23/2020 1241   GFRNONAA >60 10/07/2020 1442   GFRAA 71 12/11/2019 0913     ASSESSMENT AND PLAN: 71 year old female with history of IBS and GERD, due for average risk colon cancer screening.  Her last colonoscopy was in 2012 and was notable for diverticulosis, but no polyps.  We  will schedule her for a screening colonoscopy.  She has a history of IBS and is currently not take any medications other than Colace.  Dairy seems to be a trigger for her.  I recommended she try taking Bentyl as needed for when she has her bouts of abdominal pain.  I suggest that she try taking the Colace twice a day, she still has issues with hard stools on occasion. She has a history of gastric intestinal metaplasia, and has not had an upper endoscopy in 10 years.  We will plan for repeat upper endoscopy and surveillance biopsies.  CRC screening -Colonoscopy  Gastric intestinal metaplasia  -Surveillance EGD  IBS -Bentyl 20 mg p.o. every 6 hours as needed for pain -Increase colace to BID - Continue dairy free diet  The details, risks (including bleeding, perforation, infection, missed lesions, medication reactions and possible hospitalization or surgery if complications occur), benefits, and alternatives to EGD/colonoscopy with possible biopsy and possible polypectomy were discussed with the patient and she consents to proceed.   Gorman Safi E. Candis Schatz, MD [facility]  CC:  Sharion Balloon, FNP

## 2021-02-10 ENCOUNTER — Encounter: Payer: Self-pay | Admitting: Gastroenterology

## 2021-02-17 DIAGNOSIS — M545 Low back pain, unspecified: Secondary | ICD-10-CM | POA: Insufficient documentation

## 2021-02-17 DIAGNOSIS — M5459 Other low back pain: Secondary | ICD-10-CM | POA: Diagnosis not present

## 2021-02-19 ENCOUNTER — Other Ambulatory Visit: Payer: Self-pay | Admitting: Family

## 2021-02-19 DIAGNOSIS — E785 Hyperlipidemia, unspecified: Secondary | ICD-10-CM

## 2021-02-22 ENCOUNTER — Other Ambulatory Visit: Payer: Self-pay

## 2021-02-22 ENCOUNTER — Ambulatory Visit: Payer: Medicare Other | Admitting: Pulmonary Disease

## 2021-02-22 ENCOUNTER — Encounter: Payer: Self-pay | Admitting: Pulmonary Disease

## 2021-02-22 DIAGNOSIS — G4733 Obstructive sleep apnea (adult) (pediatric): Secondary | ICD-10-CM | POA: Diagnosis not present

## 2021-02-22 NOTE — Patient Instructions (Signed)
°  X check with your dentist about mouth guard   X make appt with sleep center [phone] for better fitting nasal mask

## 2021-02-22 NOTE — Progress Notes (Signed)
° °  Subjective:    Patient ID: Laura James, female    DOB: 11/12/1950, 71 y.o.   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 On auto CPAP  PMH - breast cancer survivor  Last office visit in 2021, we restudied her with a home sleep test, she had mild OSA but severe in supine position so we asked her to get back on CPAP machine.  She has not been able to adjust to full facemask.  She has never tried a nasal mask.  She would like to revisit this issue and wonders how important it helps for this treatment. She had major dental work and dentures in her upper jaw and is seeing a dentist She has lost from 199 to 193 pounds  Significant tests/ events reviewed  05/2018 HST mild OSA 13/hour but worse during supine sleep 32/hour  03/2014 PSG AHI 16/hour, increase during REM sleep, lowest desaturation 81%, corrected by CPAP of 15 cm with a full facemask - 199 lbs  Review of Systems neg for any significant sore throat, dysphagia, itching, sneezing, nasal congestion or excess/ purulent secretions, fever, chills, sweats, unintended wt loss, pleuritic or exertional cp, hempoptysis, orthopnea pnd or change in chronic leg swelling. Also denies presyncope, palpitations, heartburn, abdominal pain, nausea, vomiting, diarrhea or change in bowel or urinary habits, dysuria,hematuria, rash, arthralgias, visual complaints, headache, numbness weakness or ataxia.     Objective:   Physical Exam  Gen. Pleasant, obese, in no distress ENT - no lesions, no post nasal drip Neck: No JVD, no thyromegaly, no carotid bruits Lungs: no use of accessory muscles, no dullness to percussion, decreased without rales or rhonchi  Cardiovascular: Rhythm regular, heart sounds  normal, no murmurs or gallops, no peripheral edema Musculoskeletal: No deformities, no cyanosis or clubbing , no tremors       Assessment & Plan:   Upper  airway cough syndrome-resolved Hypertension-controlled on 2 meds

## 2021-02-22 NOTE — Assessment & Plan Note (Signed)
She had mild OSA but this was worse in supine position.  She has hypertension requiring 2 medications. She has not been able to tolerate full facemask we discussed alternatives which include -Nasal mask or nasal pillows -Dental appliance -She would likely not qualify for hypoglossal nerve stimulation and that would be too aggressive. She is willing to explore the dental option and will discuss with her dentist.  If she has a mouthguard made, we should restudy her with a home sleep test using the mouthguard. If on the other hand she is not able to go down that route, then I offered her mask desensitization visit at our sleep center and to find an appropriate nasal mask or nasal pillows

## 2021-02-28 DIAGNOSIS — M5416 Radiculopathy, lumbar region: Secondary | ICD-10-CM | POA: Diagnosis not present

## 2021-03-10 DIAGNOSIS — M5416 Radiculopathy, lumbar region: Secondary | ICD-10-CM | POA: Insufficient documentation

## 2021-03-14 ENCOUNTER — Ambulatory Visit (HOSPITAL_BASED_OUTPATIENT_CLINIC_OR_DEPARTMENT_OTHER): Payer: Medicare Other | Attending: Pulmonary Disease | Admitting: Pulmonary Disease

## 2021-03-14 ENCOUNTER — Encounter: Payer: Self-pay | Admitting: Gastroenterology

## 2021-03-14 DIAGNOSIS — G4733 Obstructive sleep apnea (adult) (pediatric): Secondary | ICD-10-CM

## 2021-03-21 ENCOUNTER — Ambulatory Visit (AMBULATORY_SURGERY_CENTER): Payer: Medicare Other | Admitting: Gastroenterology

## 2021-03-21 ENCOUNTER — Encounter: Payer: Self-pay | Admitting: Gastroenterology

## 2021-03-21 VITALS — BP 145/82 | HR 63 | Temp 97.8°F | Resp 15 | Ht 64.0 in | Wt 194.0 lb

## 2021-03-21 DIAGNOSIS — K2970 Gastritis, unspecified, without bleeding: Secondary | ICD-10-CM

## 2021-03-21 DIAGNOSIS — K2950 Unspecified chronic gastritis without bleeding: Secondary | ICD-10-CM | POA: Diagnosis not present

## 2021-03-21 DIAGNOSIS — Z1211 Encounter for screening for malignant neoplasm of colon: Secondary | ICD-10-CM

## 2021-03-21 DIAGNOSIS — K2940 Chronic atrophic gastritis without bleeding: Secondary | ICD-10-CM

## 2021-03-21 DIAGNOSIS — K317 Polyp of stomach and duodenum: Secondary | ICD-10-CM | POA: Diagnosis not present

## 2021-03-21 DIAGNOSIS — K31A0 Gastric intestinal metaplasia, unspecified: Secondary | ICD-10-CM | POA: Diagnosis not present

## 2021-03-21 DIAGNOSIS — K639 Disease of intestine, unspecified: Secondary | ICD-10-CM

## 2021-03-21 DIAGNOSIS — Z1212 Encounter for screening for malignant neoplasm of rectum: Secondary | ICD-10-CM

## 2021-03-21 DIAGNOSIS — K5989 Other specified functional intestinal disorders: Secondary | ICD-10-CM | POA: Diagnosis not present

## 2021-03-21 MED ORDER — OMEPRAZOLE 20 MG PO CPDR: 20.0000 mg | DELAYED_RELEASE_CAPSULE | Freq: Two times a day (BID) | ORAL | 0 refills | Status: DC

## 2021-03-21 MED ORDER — SODIUM CHLORIDE 0.9 % IV SOLN: 500.0000 mL | Freq: Once | INTRAVENOUS | Status: DC

## 2021-03-21 NOTE — Progress Notes (Signed)
Pt in recovery with monitors in place, VSS. Report given to receiving RN. Bite guard was placed with pt awake to ensure comfort. No dental or soft tissue damage noted. 

## 2021-03-21 NOTE — Op Note (Signed)
[place] Patient Name: Laura James Procedure Date: 03/21/2021 1:29 PM MRN: [id number] Endoscopist: Nicki Reaper E. Candis Schatz , MD Age: 71 Referring MD:  Date of Birth: December 08, 1950 Gender: Female Account #: [id number] Procedure:                Upper GI endoscopy Indications:              Surveillance for malignancy due to personal history                            of premalignant condition (gastric intestinal                            metaplasia) Medicines:                Monitored Anesthesia Care Procedure:                Pre-Anesthesia Assessment:                           - Prior to the procedure, a History and Physical                            was performed, and patient medications and                            allergies were reviewed. The patient's tolerance of                            previous anesthesia was also reviewed. The risks                            and benefits of the procedure and the sedation                            options and risks were discussed with the patient.                            All questions were answered, and informed consent                            was obtained. Prior Anticoagulants: The patient has                            taken no previous anticoagulant or antiplatelet                            agents. ASA Grade Assessment: III - A patient with                            severe systemic disease. After reviewing the risks                            and benefits, the patient was deemed in  satisfactory condition to undergo the procedure.                           After obtaining informed consent, the endoscope was                            passed under direct vision. Throughout the                            procedure, the patient's blood pressure, pulse, and                            oxygen saturations were monitored continuously. The                            Endoscope was introduced through the  mouth, and                            advanced to the third part of duodenum. The upper                            GI endoscopy was accomplished without difficulty.                            The patient tolerated the procedure well. Scope In: Scope Out: Findings:                 The examined portions of the nasopharynx,                            oropharynx and larynx were normal.                           The examined esophagus was normal.                           Two 3 to 10 mm pedunculated and sessile polyps with                            no bleeding and no stigmata of recent bleeding were                            found in the cardia. The larger polyp was removed                            with a cold snare. Resection and retrieval were                            complete. To prevent bleeding after the                            polypectomy, one hemostatic clip was successfully  placed (MR conditional). There was no bleeding at                            the end of the procedure. The smaller polyp was                            biopsied with cold forceps. Resection was difficult                            to the location. It is possible residual polypoid                            tissue remained.                           A single 2 mm sessile polyp was found in the                            prepyloric region of the stomach. The polyp was                            removed with a cold biopsy forceps. Resection and                            retrieval were complete. Estimated blood loss was                            minimal.                           Diffuse mild mucosal changes characterized by                            atrophy and a decreased vascular pattern were found                            in the gastric body and in the gastric antrum. The                            gastric body was notable for numerous diminutive                             polypoid lesions. Twelve Biopsies were taken with a                            cold forceps for histology from the antrum/incisura                            as well as the gastric body (placed in two separate                            jars). Estimated blood loss was minimal.  The examined duodenum was normal. Complications:            No immediate complications. Estimated Blood Loss:     Estimated blood loss was minimal. Impression:               - The examined portions of the nasopharynx,                            oropharynx and larynx were normal.                           - Normal esophagus.                           - Two gastric polyps. Resected and retrieved. Clip                            (MR conditional) was placed.                           - A single prepyloric gastric polyp. Resected and                            retrieved.                           - Atrophic and decreased vascular pattern mucosa in                            the gastric body and antrum. Biopsied.                           - Normal examined duodenum. Recommendation:           - Patient has a contact number available for                            emergencies. The signs and symptoms of potential                            delayed complications were discussed with the                            patient. Return to normal activities tomorrow.                            Written discharge instructions were provided to the                            patient.                           - Resume previous diet.                           - Continue present medications.                           -  Await pathology results.                           - Use Prilosec (omeprazole) 20 mg PO BID for 4                            weeks as polypectomy site heals.                           - Recommendations for surveillance endoscopy will                            be based on pathology results. Laura James  E. Candis Schatz, MD 03/21/2021 2:26:21 PM This report has been signed electronically.

## 2021-03-21 NOTE — Op Note (Signed)
Ages Patient Name: Laura James Procedure Date: 03/21/2021 1:28 PM MRN: [id number] Endoscopist: Nicki Reaper E. Candis Schatz , MD Age: 71 Referring MD:  Date of Birth: [birth year] Gender: Female Account #: [id number] Procedure:                Colonoscopy Indications:              Screening for colorectal malignant neoplasm Medicines:                Monitored Anesthesia Care Procedure:                Pre-Anesthesia Assessment:                           - Prior to the procedure, a History and Physical                            was performed, and patient medications and                            allergies were reviewed. The patient's tolerance of                            previous anesthesia was also reviewed. The risks                            and benefits of the procedure and the sedation                            options and risks were discussed with the patient.                            All questions were answered, and informed consent                            was obtained. Prior Anticoagulants: The patient has                            taken no previous anticoagulant or antiplatelet                            agents. ASA Grade Assessment: III - A patient with                            severe systemic disease. After reviewing the risks                            and benefits, the patient was deemed in                            satisfactory condition to undergo the procedure.                           After obtaining informed consent, the colonoscope  was passed under direct vision. Throughout the                            procedure, the patient's blood pressure, pulse, and                            oxygen saturations were monitored continuously. The                            CF [id number] #[id number] was introduced through the anus                            and advanced to the the cecum, identified by                            appendiceal  orifice and ileocecal valve. The                            colonoscopy was performed without difficulty. The                            patient tolerated the procedure well. The quality                            of the bowel preparation was adequate. The                            ileocecal valve, appendiceal orifice, and rectum                            were photographed. Scope In: 2:00:19 PM Scope Out: 2:14:24 PM Scope Withdrawal Time: 0 hours 10 minutes 24 seconds  Total Procedure Duration: 0 hours 14 minutes 5 seconds  Findings:                 The perianal and digital rectal examinations were                            normal. Pertinent negatives include normal                            sphincter tone and no palpable rectal lesions.                           A localized area of nodular mucosa was found at the                            hepatic flexure. Biopsies were taken with a cold                            forceps for histology. Estimated blood loss was                            minimal.  Many small and large-mouthed diverticula were found                            in the sigmoid colon and descending colon. There                            was no evidence of diverticular bleeding.                           The exam was otherwise normal throughout the                            examined colon.                           The terminal ileum appeared normal.                           Non-bleeding internal hemorrhoids were found during                            retroflexion. The hemorrhoids were Grade I                            (internal hemorrhoids that do not prolapse).                           No additional abnormalities were found on                            retroflexion. Complications:            No immediate complications. Estimated Blood Loss:     Estimated blood loss was minimal. Impression:               - Nodular mucosa at the hepatic  flexure. Biopsied.                           - Moderate diverticulosis in the sigmoid colon and                            in the descending colon. There was no evidence of                            diverticular bleeding.                           - The examined portion of the ileum was normal.                           - Non-bleeding internal hemorrhoids. Recommendation:           - Patient has a contact number available for                            emergencies. The signs and symptoms of potential  delayed complications were discussed with the                            patient. Return to normal activities tomorrow.                            Written discharge instructions were provided to the                            patient.                           - Resume previous diet.                           - Continue present medications.                           - Await pathology results.                           - Given age and absence of polyps, recommend                            against any further screening colonoscopies                            (assuming biopsies are negative for dysplasia). Rikki Smestad E. Candis Schatz, MD 03/21/2021 2:30:56 PM This report has been signed electronically.

## 2021-03-21 NOTE — Progress Notes (Signed)
Pt's states no medical or surgical changes since previsit or office visit. 

## 2021-03-21 NOTE — Progress Notes (Signed)
[facility] History and Physical   Primary Care Physician:  Sharion Balloon, FNP   Reason for Procedure:   Colon cancer screening and GIM surveillance  Plan:    Screening colonoscopy and EGD with GIM surveillance biopsies     HPI: Laura James is a 71 y.o. female undergoing average risk screening colonoscopy and GIM surveillance EGD.  Her last colonoscopy was in 2012 and was unremarkable.  She has had gastric intestinal metaplasia noted on 2 previous EGDs, most recently in 2012.  No history of dysplasia.  She has no family history of colon or gastric cancer.    Past Medical History:  Diagnosis Date   Barrett esophagus    Benign neoplasm of other and unspecified site of the digestive system 04/29/2007   Breast cancer ([place]) 10/13/2020   Chronic bronchitis ([facility])    Depression    Diverticulosis of colon (without mention of hemorrhage)    Esophageal reflux    Essential hypertension, benign    Gastritis    Grave's disease    Hiatal hernia    Hypothyroidism    IBS (irritable bowel syndrome)    Obesity    Osteopenia    Sleep apnea    Symptomatic menopausal or female climacteric states     Past Surgical History:  Procedure Laterality Date   ABDOMINAL HYSTERECTOMY N/A    BREAST LUMPECTOMY WITH RADIOACTIVE SEED LOCALIZATION Right 10/13/2020   Procedure: RIGHT BREAST LUMPECTOMY WITH RADIOACTIVE SEED LOCALIZATION;  Surgeon: Rolm Bookbinder, MD;  Location: [facility];  Service: General;  Laterality: Right;   CHOLECYSTECTOMY     INCONTINENCE SURGERY     TONSILLECTOMY      Prior to Admission medications   Medication Sig Start Date End Date Taking? Authorizing Provider  aspirin 81 MG tablet Take 81 mg by mouth daily.   Yes [provider]  atenolol (TENORMIN) 25 MG tablet Take 1 tablet (25 mg total) by mouth daily. (Needs to be seen before next refill) 12/23/20  Yes Evelina Dun A, FNP  atorvastatin (LIPITOR) 10 MG tablet TAKE 1 TABLET BY  MOUTH EVERY DAY 02/21/21  Yes Hawks, Christy A, FNP  cholecalciferol (VITAMIN D) 1000 units tablet Take 1 tablet (1,000 Units total) by mouth daily. 09/06/16  Yes Eckard, Tammy, RPH-CPP  Cyanocobalamin (B-12) 3000 MCG CAPS Take 1 capsule by mouth daily.   Yes [provider]  docusate sodium (COLACE) 100 MG capsule Take 100 mg by mouth daily as needed for mild constipation.   Yes [provider]  hydrochlorothiazide (HYDRODIURIL) 25 MG tablet Take 1 tablet (25 mg total) by mouth daily. 09/23/20  Yes Hawks, Christy A, FNP  magnesium oxide (MAG-OX) 400 MG tablet Take 400 mg by mouth daily.   Yes [provider]  tamoxifen (NOLVADEX) 10 MG tablet Take 1 tablet (10 mg total) by mouth daily. 01/27/21  Yes Nicholas Lose, MD  albuterol (PROAIR HFA) 108 (90 Base) MCG/ACT inhaler INHALE 2 PUFFS BY MOUTH EVERY 6 HOURS AS NEEDED FOR WHEEZE OR SHORTNESS OF BREATH 09/23/20   Evelina Dun A, FNP  cetirizine (ZYRTEC) 10 MG tablet Take 10 mg by mouth daily as needed.     [provider]  dicyclomine (BENTYL) 20 MG tablet Take 1 tablet (20 mg total) by mouth every 6 (six) hours. Patient not taking: Reported on 02/22/2021 02/08/21   Daryel November, MD  fluticasone [facility]) 50 MCG/ACT nasal spray Place 2 sprays into both nostrils daily. 09/23/20   Evelina Dun  A, FNP  Iron, Ferrous Sulfate, 325 (65 Fe) MG TABS Take 325 mg by mouth 2 (two) times daily with a meal. 09/23/20 02/08/21  Hawks, Alyse Low A, FNP  omeprazole (PRILOSEC) 20 MG capsule TAKE 1 CAPSULE BY MOUTH EVERY DAY Patient taking differently: daily as needed. 12/27/20   Sharion Balloon, FNP    Current Outpatient Medications  Medication Sig Dispense Refill   aspirin 81 MG tablet Take 81 mg by mouth daily.     atenolol (TENORMIN) 25 MG tablet Take 1 tablet (25 mg total) by mouth daily. (Needs to be seen before next refill) 90 tablet 1   atorvastatin (LIPITOR) 10 MG tablet TAKE 1 TABLET BY MOUTH EVERY DAY 90 tablet 1    cholecalciferol (VITAMIN D) 1000 units tablet Take 1 tablet (1,000 Units total) by mouth daily.     Cyanocobalamin (B-12) 3000 MCG CAPS Take 1 capsule by mouth daily.     docusate sodium (COLACE) 100 MG capsule Take 100 mg by mouth daily as needed for mild constipation.     hydrochlorothiazide (HYDRODIURIL) 25 MG tablet Take 1 tablet (25 mg total) by mouth daily. 90 tablet 1   magnesium oxide (MAG-OX) 400 MG tablet Take 400 mg by mouth daily.     tamoxifen (NOLVADEX) 10 MG tablet Take 1 tablet (10 mg total) by mouth daily. 90 tablet 3   albuterol (PROAIR HFA) 108 (90 Base) MCG/ACT inhaler INHALE 2 PUFFS BY MOUTH EVERY 6 HOURS AS NEEDED FOR WHEEZE OR SHORTNESS OF BREATH 8.5 each 2   cetirizine (ZYRTEC) 10 MG tablet Take 10 mg by mouth daily as needed.      dicyclomine (BENTYL) 20 MG tablet Take 1 tablet (20 mg total) by mouth every 6 (six) hours. (Patient not taking: Reported on 02/22/2021) 30 tablet 3   fluticasone (FLONASE) 50 MCG/ACT nasal spray Place 2 sprays into both nostrils daily. 16 g 6   Iron, Ferrous Sulfate, 325 (65 Fe) MG TABS Take 325 mg by mouth 2 (two) times daily with a meal. 180 tablet 5   omeprazole (PRILOSEC) 20 MG capsule TAKE 1 CAPSULE BY MOUTH EVERY DAY (Patient taking differently: daily as needed.) 90 capsule 0   Current Facility-Administered Medications  Medication Dose Route Frequency Provider Last Rate Last Admin   0.9 %  sodium chloride infusion  500 mL Intravenous Once Daryel November, MD        Allergies as of 03/21/2021 - Review Complete 03/21/2021  Allergen Reaction Noted   Adhesive [tape] Itching and Rash 12/30/2010   Latex Rash 12/23/2013    Family History  Problem Relation Age of Onset   Hypertension Mother    Arthritis Mother    Osteoporosis Mother    Kidney disease Mother        related to APAP use   Lung cancer Father    Hypertension Brother    Hypertension Daughter    Diabetes Daughter    Diabetes Son    Diabetes Other    Clotting disorder  Other    Colon cancer Neg Hx     Social History   Socioeconomic History   Marital status: Married    Spouse name: Francee Piccolo   Number of children: 3   Years of education: 16   Highest education level: Conservator, museum/gallery (e.g., MA, MS, MEng, MEd, MSW, MBA)  Occupational History   Occupation: Hospice- Chaplain    Comment: [place]   Occupation: retired  Tobacco Use   Smoking status: Former  Packs/day: 0.50    Years: 5.00    Pack years: 2.50    Types: Cigarettes    Quit date: 01/24/1968    Years since quitting: 53.1   Smokeless tobacco: Never  Vaping Use   Vaping Use: Never used  Substance and Sexual Activity   Alcohol use: No    Alcohol/week: 0.0 standard drinks   Drug use: No   Sexual activity: Yes  Other Topics Concern   Not on file  Social History Narrative   Lives at home with husband    Social Determinants of Health   Financial Resource Strain: Low Risk    Difficulty of Paying Living Expenses: Not hard at all  Food Insecurity: No Food Insecurity   Worried About Charity fundraiser in the Last Year: Never true   Ran Out of Food in the Last Year: Never true  Transportation Needs: No Transportation Needs   Lack of Transportation (Medical): No   Lack of Transportation (Non-Medical): No  Physical Activity: Insufficiently Active   Days of Exercise per Week: 4 days   Minutes of Exercise per Session: 20 min  Stress: No Stress Concern Present   Feeling of Stress : Only a little  Social Connections: Engineer, building services of Communication with Friends and Family: More than three times a week   Frequency of Social Gatherings with Friends and Family: More than three times a week   Attends Religious Services: More than 4 times per year   Active Member of [employer] or Organizations: Yes   Attends Music therapist: More than 4 times per year   Marital Status: Married  Human resources officer Violence: Not At Risk   Fear of Current or Ex-Partner: No    Emotionally Abused: No   Physically Abused: No   Sexually Abused: No    Review of Systems:  All other review of systems negative except as mentioned in the HPI.  Physical Exam: Vital signs BP (!) 145/73    Pulse 63    Temp 97.8 F (36.6 C) (Temporal)    Ht 5\' 4"  (1.626 m)    Wt 194 lb (88 kg)    SpO2 96%    BMI 33.30 kg/m   General:   Alert,  Well-developed, well-nourished, pleasant and cooperative in NAD Airway:  Mallampati 2 Lungs:  Clear throughout to auscultation.   Heart:  Regular rate and rhythm; no murmurs, clicks, rubs,  or gallops. Abdomen:  Soft, nontender and nondistended. Normal bowel sounds.   Neuro/Psych:  Normal mood and affect. A and O x 3   Royale Lennartz E. Candis Schatz, MD [facility]

## 2021-03-21 NOTE — Progress Notes (Signed)
Called to room to assist during endoscopic procedure.  Patient ID and intended procedure confirmed with present staff. Received instructions for my participation in the procedure from the performing physician.  

## 2021-03-21 NOTE — Patient Instructions (Signed)
YOU HAD AN ENDOSCOPIC PROCEDURE TODAY AT THE [facility]:   Refer to the procedure report that was given to you for any specific questions about what was found during the examination.  If the procedure report does not answer your questions, please call your gastroenterologist to clarify.  If you requested that your care partner not be given the details of your procedure findings, then the procedure report has been included in a sealed envelope for you to review at your convenience later.  YOU SHOULD EXPECT: Some feelings of bloating in the abdomen. Passage of more gas than usual.  Walking can help get rid of the air that was put into your GI tract during the procedure and reduce the bloating. If you had a lower endoscopy (such as a colonoscopy or flexible sigmoidoscopy) you may notice spotting of blood in your stool or on the toilet paper. If you underwent a bowel prep for your procedure, you may not have a normal bowel movement for a few days.  Please Note:  You might notice some irritation and congestion in your nose or some drainage.  This is from the oxygen used during your procedure.  There is no need for concern and it should clear up in a day or so.  SYMPTOMS TO REPORT IMMEDIATELY:   Following lower endoscopy (colonoscopy or flexible sigmoidoscopy):  Excessive amounts of blood in the stool  Significant tenderness or worsening of abdominal pains  Swelling of the abdomen that is new, acute  Fever of 100F or higher   Following upper endoscopy (EGD)  Vomiting of blood or coffee ground material  New chest pain or pain under the shoulder blades  Painful or persistently difficult swallowing  New shortness of breath  Fever of 100F or higher  Black, tarry-looking stools  For urgent or emergent issues, a gastroenterologist can be reached at any hour by calling [phone]. Do not use MyChart messaging for urgent concerns.    DIET:  We do recommend a small meal at first, but  then you may proceed to your regular diet.  Drink plenty of fluids but you should avoid alcoholic beverages for 24 hours.  ACTIVITY:  You should plan to take it easy for the rest of today and you should NOT DRIVE or use heavy machinery until tomorrow (because of the sedation medicines used during the test).    FOLLOW UP: Our staff will call the number listed on your records 48-72 hours following your procedure to check on you and address any questions or concerns that you may have regarding the information given to you following your procedure. If we do not reach you, we will leave a message.  We will attempt to reach you two times.  During this call, we will ask if you have developed any symptoms of COVID 19. If you develop any symptoms (ie: fever, flu-like symptoms, shortness of breath, cough etc.) before then, please call [phone].  If you test positive for Covid 19 in the 2 weeks post procedure, please call and report this information to us.    If any biopsies were taken you will be contacted by phone or by letter within the next 1-3 weeks.  Please call us at [phone] if you have not heard about the biopsies in 3 weeks.    SIGNATURES/CONFIDENTIALITY: You and/or your care partner have signed paperwork which will be entered into your electronic medical record.  These signatures attest to the fact that that the information above on   your After Visit Summary has been reviewed and is understood.  Full responsibility of the confidentiality of this discharge information lies with you and/or your care-partner. 

## 2021-03-23 ENCOUNTER — Telehealth: Payer: Self-pay

## 2021-03-23 NOTE — Telephone Encounter (Signed)
?  Follow up Call- ? ?Call back number 03/21/2021  ?Post procedure Call Back phone  # [phone]  ?Permission to leave phone message Yes  ?Some recent data might be hidden  ?  ? ?Patient questions: ? ?Do you have a fever, pain , or abdominal swelling? No. ?Pain Score  0 * ? ?Have you tolerated food without any problems? Yes.   ? ?Have you been able to return to your normal activities? Yes.   ? ?Do you have any questions about your discharge instructions: ?Diet   No. ?Medications  No. ?Follow up visit  No. ? ?Do you have questions or concerns about your Care? No. ? ?Actions: ?* If pain score is 4 or above: ?No action needed, pain <4. ? ? ?

## 2021-03-24 DIAGNOSIS — M5416 Radiculopathy, lumbar region: Secondary | ICD-10-CM | POA: Diagnosis not present

## 2021-03-28 NOTE — Addendum Note (Signed)
Addended by: Dustin Flock E on: 03/28/2021 01:07 PM   Modules accepted: Orders

## 2021-03-28 NOTE — Progress Notes (Signed)
Ms. Levert,  The biopsies of your stomach showed signs of chronic atrophic gastritis and gastric intestinal metaplasia involving the body of the stomach.  No dysplasia or precancerous changes were seen.  The polyps that were removed were hyperplastic polyps which do not have cancerous potential.    I would like to get a few more blood tests to assess for autoimmune gastritis (a condition of chronic inflammation of the stomach which can increase the risk for stomach cancer).   The tests will be more accurate if you are fasting.   Also, one of the labs will be affected by the medication I prescribed you (omeprazole).   Please wait until 2 weeks after you have completed the course of omeprazole prescribed before submitting these blood samples.  I recommend that you repeat an upper endoscopy in 3 years to take more biopsies and screen for stomach cancer.

## 2021-03-29 ENCOUNTER — Telehealth: Payer: Self-pay | Admitting: *Deleted

## 2021-03-30 ENCOUNTER — Telehealth: Payer: Self-pay | Admitting: Pulmonary Disease

## 2021-03-30 NOTE — Telephone Encounter (Signed)
Called patient but she did not answer. Left message for her to call back. Will need to confirm that she is still using [facility] as her DME.  ?

## 2021-04-04 ENCOUNTER — Encounter: Payer: Self-pay | Admitting: Gastroenterology

## 2021-04-05 DIAGNOSIS — M5416 Radiculopathy, lumbar region: Secondary | ICD-10-CM | POA: Diagnosis not present

## 2021-04-07 ENCOUNTER — Other Ambulatory Visit: Payer: Self-pay | Admitting: Family

## 2021-04-14 ENCOUNTER — Other Ambulatory Visit: Payer: Self-pay | Admitting: Gastroenterology

## 2021-04-15 ENCOUNTER — Other Ambulatory Visit: Payer: Self-pay | Admitting: Gastroenterology

## 2021-04-19 DIAGNOSIS — M5416 Radiculopathy, lumbar region: Secondary | ICD-10-CM | POA: Diagnosis not present

## 2021-04-20 DIAGNOSIS — D0511 Intraductal carcinoma in situ of right breast: Secondary | ICD-10-CM | POA: Diagnosis not present

## 2021-04-25 ENCOUNTER — Telehealth: Payer: Self-pay | Admitting: *Deleted

## 2021-04-26 ENCOUNTER — Other Ambulatory Visit: Payer: Self-pay

## 2021-04-26 ENCOUNTER — Inpatient Hospital Stay: Payer: Medicare Other | Attending: Adult Health | Admitting: Adult Health

## 2021-04-26 VITALS — BP 124/88 | HR 60 | Temp 97.7°F | Resp 16 | Ht 64.0 in | Wt 193.4 lb

## 2021-04-26 DIAGNOSIS — G4730 Sleep apnea, unspecified: Secondary | ICD-10-CM | POA: Diagnosis not present

## 2021-04-26 DIAGNOSIS — K589 Irritable bowel syndrome without diarrhea: Secondary | ICD-10-CM | POA: Insufficient documentation

## 2021-04-26 DIAGNOSIS — Z7982 Long term (current) use of aspirin: Secondary | ICD-10-CM | POA: Diagnosis not present

## 2021-04-26 DIAGNOSIS — Z79899 Other long term (current) drug therapy: Secondary | ICD-10-CM | POA: Insufficient documentation

## 2021-04-26 DIAGNOSIS — K219 Gastro-esophageal reflux disease without esophagitis: Secondary | ICD-10-CM | POA: Insufficient documentation

## 2021-04-26 DIAGNOSIS — M8580 Other specified disorders of bone density and structure, unspecified site: Secondary | ICD-10-CM | POA: Diagnosis not present

## 2021-04-26 DIAGNOSIS — Z923 Personal history of irradiation: Secondary | ICD-10-CM | POA: Diagnosis not present

## 2021-04-26 DIAGNOSIS — I10 Essential (primary) hypertension: Secondary | ICD-10-CM | POA: Insufficient documentation

## 2021-04-26 DIAGNOSIS — E039 Hypothyroidism, unspecified: Secondary | ICD-10-CM | POA: Insufficient documentation

## 2021-04-26 DIAGNOSIS — R5383 Other fatigue: Secondary | ICD-10-CM | POA: Diagnosis not present

## 2021-04-26 DIAGNOSIS — D0511 Intraductal carcinoma in situ of right breast: Secondary | ICD-10-CM | POA: Insufficient documentation

## 2021-04-26 DIAGNOSIS — E669 Obesity, unspecified: Secondary | ICD-10-CM | POA: Insufficient documentation

## 2021-04-26 DIAGNOSIS — Z170 Estrogen receptor positive status [ER+]: Secondary | ICD-10-CM | POA: Diagnosis not present

## 2021-04-26 NOTE — Progress Notes (Signed)
SURVIVORSHIP  VISIT: ? ? ? ?BRIEF ONCOLOGIC HISTORY:  ?Oncology History  ?Ductal carcinoma in situ (DCIS) of right breast  ?09/13/2020 Initial Diagnosis  ? Screening mammogram: calcifications in the right breast. Diagnostic mammogram and [place]: 1 cm group of indeterminate calcifications within the upper outer right breast.  ?  ?10/13/2020 Surgery  ? Right lumpectomy: intermediate grade DCIS 0.6 cm with margins uninvolved by carcinoma.  ER 95%, PR 90% ?  ?11/29/2020 - 12/22/2020 Radiation Therapy  ? Site: Right Breast ?Technique: 3D CRT ?Goal: Curative ?Whole Breast 4272 cGy in 267 cGy daily fractions (16 Fractions) ?  ?01/27/2021 -  Anti-estrogen oral therapy  ? Tamoxifen ?  ? ? ?INTERVAL HISTORY:  ?Laura James to review her survivorship care plan detailing her treatment course for breast cancer, as well as monitoring long-term side effects of that treatment, education regarding health maintenance, screening, and overall wellness and health promotion.    ? ?Overall, Laura James reports feeling quite well.  She is taking tmaoixfen daily with good tolerance.  Her main issues include constipation and fatigue today.  She eats many vegetables, but not a fiber rich diet and notes she does good on water intake some days, and on other days she could use some improvement.  She also notes that she feels tired after a few short hours of her previous normal activity.   ? ?REVIEW OF SYSTEMS:  ?Review of Systems  ?Constitutional:  Positive for fatigue. Negative for appetite change, chills, fever and unexpected weight change.  ?HENT:   Negative for hearing loss, lump/mass and trouble swallowing.   ?Eyes:  Negative for eye problems and icterus.  ?Respiratory:  Negative for chest tightness, cough and shortness of breath.   ?Cardiovascular:  Negative for chest pain, leg swelling and palpitations.  ?Gastrointestinal:  Positive for constipation. Negative for abdominal distention, abdominal pain, diarrhea, nausea and vomiting.  ?Endocrine: Negative  for hot flashes.  ?Genitourinary:  Negative for difficulty urinating.   ?Musculoskeletal:  Negative for arthralgias.  ?Skin:  Negative for itching and rash.  ?Neurological:  Negative for dizziness, extremity weakness, headaches and numbness.  ?Hematological:  Negative for adenopathy. Does not bruise/bleed easily.  ?Psychiatric/Behavioral:  Negative for depression. The patient is not nervous/anxious.   ?Breast: Denies any new nodularity, masses, tenderness, nipple changes, or nipple discharge.  ? ? ?ONCOLOGY TREATMENT TEAM:  ?1. Surgeon:  Dr. Donne Hazel at [facility] ?2. Medical Oncologist: Dr. Lindi Adie  ?3. Radiation Oncologist: Dr. Lynnette Caffey ?  ? ?PAST MEDICAL/SURGICAL HISTORY:  ?Past Medical History:  ?Diagnosis Date  ? Barrett esophagus   ? Benign neoplasm of other and unspecified site of the digestive system 04/29/2007  ? Breast cancer ([place]) 10/13/2020  ? Chronic bronchitis ([place])   ? Depression   ? Diverticulosis of colon (without mention of hemorrhage)   ? Esophageal reflux   ? Essential hypertension, benign   ? Gastritis   ? Grave's disease   ? Hiatal hernia   ? Hypothyroidism   ? IBS (irritable bowel syndrome)   ? Obesity   ? Osteopenia   ? Sleep apnea   ? Symptomatic menopausal or female climacteric states   ? ?Past Surgical History:  ?Procedure Laterality Date  ? ABDOMINAL HYSTERECTOMY N/A   ? BREAST LUMPECTOMY WITH RADIOACTIVE SEED LOCALIZATION Right 10/13/2020  ? Procedure: RIGHT BREAST LUMPECTOMY WITH RADIOACTIVE SEED LOCALIZATION;  Surgeon: Rolm Bookbinder, MD;  Location: [facility];  Service: General;  Laterality: Right;  ? CHOLECYSTECTOMY    ? INCONTINENCE SURGERY    ?  TONSILLECTOMY    ? ? ? ?ALLERGIES:  ?Allergies  ?Allergen Reactions  ? Adhesive [Tape] Itching and Rash  ? Latex Rash  ? ? ? ?CURRENT MEDICATIONS:  ?Outpatient Encounter Medications as of 04/26/2021  ?Medication Sig  ? aspirin 81 MG tablet Take 81 mg by mouth daily.  ? atenolol (TENORMIN) 25 MG tablet Take 1  tablet (25 mg total) by mouth daily. (Needs to be seen before next refill)  ? atorvastatin (LIPITOR) 10 MG tablet TAKE 1 TABLET BY MOUTH EVERY DAY  ? cetirizine (ZYRTEC) 10 MG tablet Take 10 mg by mouth daily as needed.   ? cholecalciferol (VITAMIN D) 1000 units tablet Take 1 tablet (1,000 Units total) by mouth daily.  ? Cyanocobalamin (B-12) 3000 MCG CAPS Take 1 capsule by mouth daily.  ? docusate sodium (COLACE) 100 MG capsule Take 200 mg by mouth 2 (two) times daily.  ? hydrochlorothiazide (HYDRODIURIL) 25 MG tablet TAKE 1 TABLET (25 MG TOTAL) BY MOUTH DAILY.  ? Iron, Ferrous Sulfate, 325 (65 Fe) MG TABS Take 325 mg by mouth 2 (two) times daily with a meal.  ? magnesium oxide (MAG-OX) 400 MG tablet Take 400 mg by mouth daily.  ? omeprazole (PRILOSEC OTC) 20 MG tablet Take 20 mg by mouth daily. Pt takes 1/2 tablet in the morning and 1/2 tablet in the evening  ? tamoxifen (NOLVADEX) 10 MG tablet Take 1 tablet (10 mg total) by mouth daily.  ? albuterol (PROAIR HFA) 108 (90 Base) MCG/ACT inhaler INHALE 2 PUFFS BY MOUTH EVERY 6 HOURS AS NEEDED FOR WHEEZE OR SHORTNESS OF BREATH (Patient not taking: Reported on 04/26/2021)  ? dicyclomine (BENTYL) 20 MG tablet Take 1 tablet (20 mg total) by mouth every 6 (six) hours. (Patient not taking: Reported on 02/22/2021)  ? fluticasone (FLONASE) 50 MCG/ACT nasal spray Place 2 sprays into both nostrils daily. (Patient not taking: Reported on 04/26/2021)  ? [DISCONTINUED] omeprazole (PRILOSEC) 20 MG capsule TAKE 1 CAPSULE (20 MG TOTAL) BY MOUTH 2 (TWO) TIMES DAILY BEFORE A MEAL. (Patient taking differently: Take 20 mg by mouth daily. Pt takes 1/2 tablet in morning and 1/2 tablet in the evening)  ? ?No facility-administered encounter medications on file as of 04/26/2021.  ? ? ? ?ONCOLOGIC FAMILY HISTORY:  ?Family History  ?Problem Relation Age of Onset  ? Hypertension Mother   ? Arthritis Mother   ? Osteoporosis Mother   ? Kidney disease Mother   ?     related to APAP use  ? Lung cancer  Father   ? Hypertension Brother   ? Hypertension Daughter   ? Diabetes Daughter   ? Diabetes Son   ? Diabetes Other   ? Clotting disorder Other   ? Colon cancer Neg Hx   ? ? ? ?GENETIC COUNSELING/TESTING: ?See above ? ?SOCIAL HISTORY:  ?Social History  ? ?Socioeconomic History  ? Marital status: Married  ?  Spouse name: Francee Piccolo  ? Number of children: 3  ? Years of education: 77  ? Highest education level: Master's degree (e.g., MA, MS, MEng, MEd, MSW, MBA)  ?Occupational History  ? Occupation: Hospice- Chaplain  ?  Comment: [employer]  ? Occupation: retired  ?Tobacco Use  ? Smoking status: Former  ?  Packs/day: 0.50  ?  Years: 5.00  ?  Pack years: 2.50  ?  Types: Cigarettes  ?  Quit date: 01/24/1968  ?  Years since quitting: 53.2  ? Smokeless tobacco: Never  ?Vaping Use  ? Vaping Use:  Never used  ?Substance and Sexual Activity  ? Alcohol use: No  ?  Alcohol/week: 0.0 standard drinks  ? Drug use: No  ? Sexual activity: Yes  ?Other Topics Concern  ? Not on file  ?Social History Narrative  ? Lives at home with husband   ? ?Social Determinants of Health  ? ?Financial Resource Strain: Low Risk   ? Difficulty of Paying Living Expenses: Not hard at all  ?Food Insecurity: No Food Insecurity  ? Worried About Charity fundraiser in the Last Year: Never true  ? Ran Out of Food in the Last Year: Never true  ?Transportation Needs: No Transportation Needs  ? Lack of Transportation (Medical): No  ? Lack of Transportation (Non-Medical): No  ?Physical Activity: Insufficiently Active  ? Days of Exercise per Week: 4 days  ? Minutes of Exercise per Session: 20 min  ?Stress: No Stress Concern Present  ? Feeling of Stress : Only a little  ?Social Connections: Socially Integrated  ? Frequency of Communication with Friends and Family: More than three times a week  ? Frequency of Social Gatherings with Friends and Family: More than three times a week  ? Attends Religious Services: More than 4 times per year  ? Active Member of Clubs or  Organizations: Yes  ? Attends Archivist Meetings: More than 4 times per year  ? Marital Status: Married  ?Intimate Partner Violence: Not At Risk  ? Fear of Current or Ex-Partner: No  ? Emotionally Ab

## 2021-04-27 ENCOUNTER — Telehealth: Payer: Self-pay | Admitting: Adult Health

## 2021-04-27 ENCOUNTER — Encounter: Payer: Self-pay | Admitting: Adult Health

## 2021-04-27 ENCOUNTER — Telehealth: Payer: Self-pay | Admitting: Hematology and Oncology

## 2021-04-27 NOTE — Telephone Encounter (Signed)
Scheduled appointment per 4/4 los. Left message. ?

## 2021-04-27 NOTE — Telephone Encounter (Signed)
.  Called pt per 4/5 inbasket , Patient was unavailable, a message with appt time and date was left with number on file.     ?

## 2021-05-11 ENCOUNTER — Encounter (HOSPITAL_COMMUNITY): Payer: Self-pay

## 2021-05-25 ENCOUNTER — Ambulatory Visit (INDEPENDENT_AMBULATORY_CARE_PROVIDER_SITE_OTHER): Payer: Medicare Other | Admitting: *Deleted

## 2021-05-25 DIAGNOSIS — Z23 Encounter for immunization: Secondary | ICD-10-CM

## 2021-06-13 ENCOUNTER — Ambulatory Visit (INDEPENDENT_AMBULATORY_CARE_PROVIDER_SITE_OTHER): Payer: Medicare Other | Admitting: Nurse Practitioner

## 2021-06-13 ENCOUNTER — Encounter: Payer: Self-pay | Admitting: Nurse Practitioner

## 2021-06-13 VITALS — BP 140/82 | HR 67 | Temp 98.6°F | Ht 64.0 in | Wt 191.0 lb

## 2021-06-13 DIAGNOSIS — J209 Acute bronchitis, unspecified: Secondary | ICD-10-CM

## 2021-06-13 DIAGNOSIS — R052 Subacute cough: Secondary | ICD-10-CM | POA: Diagnosis not present

## 2021-06-13 MED ORDER — GUAIFENESIN ER 600 MG PO TB12
600.0000 mg | ORAL_TABLET | Freq: Two times a day (BID) | ORAL | 0 refills | Status: DC
Start: 2021-06-13 — End: 2021-10-28

## 2021-06-13 MED ORDER — PREDNISONE 20 MG PO TABS: 20.0000 mg | ORAL_TABLET | Freq: Every day | ORAL | 0 refills | Status: DC

## 2021-06-13 MED ORDER — AMOXICILLIN-POT CLAVULANATE 875-125 MG PO TABS: 1.0000 | ORAL_TABLET | Freq: Two times a day (BID) | ORAL | 0 refills | Status: DC

## 2021-06-13 MED ORDER — BENZONATATE 100 MG PO CAPS: 100.0000 mg | ORAL_CAPSULE | Freq: Three times a day (TID) | ORAL | 0 refills | Status: DC | PRN

## 2021-06-13 NOTE — Patient Instructions (Signed)
Cough, Adult A cough helps to clear your throat and lungs. A cough may be a sign of an illness or another medical condition. An acute cough may only last 2-3 weeks, while a chronic cough may last 8 or more weeks. Many things can cause a cough. They include: Germs (viruses or bacteria) that attack the airway. Breathing in things that bother (irritate) your lungs. Allergies. Asthma. Mucus that runs down the back of your throat (postnasal drip). Smoking. Acid backing up from the stomach into the tube that moves food from the mouth to the stomach (gastroesophageal reflux). Some medicines. Lung problems. Other medical conditions, such as heart failure or a blood clot in the lung (pulmonary embolism). Follow these instructions at home: Medicines Take over-the-counter and prescription medicines only as told by your doctor. Talk with your doctor before you take medicines that stop a cough (cough suppressants). Lifestyle  Do not smoke, and try not to be around smoke. Do not use any products that contain nicotine or tobacco, such as cigarettes, e-cigarettes, and chewing tobacco. If you need help quitting, ask your doctor. Drink enough fluid to keep your pee (urine) pale yellow. Avoid caffeine. Do not drink alcohol if your doctor tells you not to drink. General instructions  Watch for any changes in your cough. Tell your doctor about them. Always cover your mouth when you cough. Stay away from things that make you cough, such as perfume, candles, campfire smoke, or cleaning products. If the air is dry, use a cool mist vaporizer or humidifier in your home. If your cough is worse at night, try using extra pillows to raise your head up higher while you sleep. Rest as needed. Keep all follow-up visits as told by your doctor. This is important. Contact a doctor if: You have new symptoms. You cough up pus. Your cough does not get better after 2-3 weeks, or your cough gets worse. Cough medicine  does not help your cough and you are not sleeping well. You have pain that gets worse or pain that is not helped with medicine. You have a fever. You are losing weight and you do not know why. You have night sweats. Get help right away if: You cough up blood. You have trouble breathing. Your heartbeat is very fast. These symptoms may be an emergency. Do not wait to see if the symptoms will go away. Get medical help right away. Call your local emergency services (911 in the [place] Do not drive yourself to the hospital. Summary A cough helps to clear your throat and lungs. Many things can cause a cough. Take over-the-counter and prescription medicines only as told by your doctor. Always cover your mouth when you cough. Contact a doctor if you have new symptoms or you have a cough that does not get better or gets worse. This information is not intended to replace advice given to you by your health care provider. Make sure you discuss any questions you have with your health care provider. Document Revised: 02/28/2019 Document Reviewed: 01/28/2018 Elsevier Patient Education  [place]. Acute Bronchitis, Adult  Acute bronchitis is sudden inflammation of the main airways (bronchi) that come off the windpipe (trachea) in the lungs. The swelling causes the airways to get smaller and make more mucus than normal. This can make it hard to breathe and can cause coughing or noisy breathing (wheezing). Acute bronchitis may last several weeks. The cough may last longer. Allergies, asthma, and exposure to smoke may make the condition worse.  What are the causes? This condition can be caused by germs and by substances that irritate the lungs, including: Cold and flu viruses. The most common cause of this condition is the virus that causes the common cold. Bacteria. This is less common. Breathing in substances that irritate the lungs, including: Smoke from cigarettes and other forms of tobacco. Dust  and pollen. Fumes from household cleaning products, gases, or burned fuel. Indoor or outdoor air pollution. What increases the risk? The following factors may make you more likely to develop this condition: A weak body's defense system, also called the immune system. A condition that affects your lungs and breathing, such as asthma. What are the signs or symptoms? Common symptoms of this condition include: Coughing. This may bring up clear, yellow, or green mucus from your lungs (sputum). Wheezing. Runny or stuffy nose. Having too much mucus in your lungs (chest congestion). Shortness of breath. Aches and pains, including sore throat or chest. How is this diagnosed? This condition is usually diagnosed based on: Your symptoms and medical history. A physical exam. You may also have other tests, including tests to rule out other conditions, such as pneumonia. These tests include: A test of lung function. Test of a mucus sample to look for the presence of bacteria. Tests to check the oxygen level in your blood. Blood tests. Chest X-ray. How is this treated? Most cases of acute bronchitis clear up over time without treatment. Your health care provider may recommend: Drinking more fluids to help thin your mucus so it is easier to cough up. Taking inhaled medicine (inhaler) to improve air flow in and out of your lungs. Using a vaporizer or a humidifier. These are machines that add water to the air to help you breathe better. Taking a medicine that thins mucus and clears congestion (expectorant). Taking a medicine that prevents or stops coughing (cough suppressant). It is notcommon to take an antibiotic medicine for this condition. Follow these instructions at home:  Take over-the-counter and prescription medicines only as told by your health care provider. Use an inhaler, vaporizer, or humidifier as told by your health care provider. Take two teaspoons (10 mL) of honey at bedtime to  lessen coughing at night. Drink enough fluid to keep your urine pale yellow. Do not use any products that contain nicotine or tobacco. These products include cigarettes, chewing tobacco, and vaping devices, such as e-cigarettes. If you need help quitting, ask your health care provider. Get plenty of rest. Return to your normal activities as told by your health care provider. Ask your health care provider what activities are safe for you. Keep all follow-up visits. This is important. How is this prevented? To lower your risk of getting this condition again: Wash your hands often with soap and water for at least 20 seconds. If soap and water are not available, use hand sanitizer. Avoid contact with people who have cold symptoms. Try not to touch your mouth, nose, or eyes with your hands. Avoid breathing in smoke or chemical fumes. Breathing smoke or chemical fumes will make your condition worse. Get the flu shot every year. Contact a health care provider if: Your symptoms do not improve after 2 weeks. You have trouble coughing up the mucus. Your cough keeps you awake at night. You have a fever. Get help right away if you: Cough up blood. Feel pain in your chest. Have severe shortness of breath. Faint or keep feeling like you are going to faint. Have a severe headache.  Have a fever or chills that get worse. These symptoms may represent a serious problem that is an emergency. Do not wait to see if the symptoms will go away. Get medical help right away. Call your local emergency services (911 in the [place] Do not drive yourself to the hospital. Summary Acute bronchitis is inflammation of the main airways (bronchi) that come off the windpipe (trachea) in the lungs. The swelling causes the airways to get smaller and make more mucus than normal. Drinking more fluids can help thin your mucus so it is easier to cough up. Take over-the-counter and prescription medicines only as told by your health  care provider. Do not use any products that contain nicotine or tobacco. These products include cigarettes, chewing tobacco, and vaping devices, such as e-cigarettes. If you need help quitting, ask your health care provider. Contact a health care provider if your symptoms do not improve after 2 weeks. This information is not intended to replace advice given to you by your health care provider. Make sure you discuss any questions you have with your health care provider. Document Revised: 05/12/2020 Document Reviewed: 05/12/2020 Elsevier Patient Education  [place].

## 2021-06-13 NOTE — Progress Notes (Signed)
Acute Office Visit  Subjective:     Patient ID: Laura James, female    DOB: 01-Mar-1950, 71 y.o.   MRN: [id number]  Chief Complaint  Patient presents with   Cough    Started Friday - coughing up yellow/clear stuff   Nasal Congestion    URI  This is a new problem. The current episode started in the past 7 days. The problem has been unchanged. There has been no fever. Associated symptoms include congestion, coughing and headaches. Pertinent negatives include no chest pain, ear pain, joint swelling, nausea, rash or sore throat. She has tried nothing for the symptoms.  Cough This is a new problem. The current episode started yesterday. The problem has been unchanged. The cough is Productive of sputum. Associated symptoms include headaches. Pertinent negatives include no chest pain, chills, ear pain, fever, rash, sore throat or shortness of breath. Nothing aggravates the symptoms. She has tried nothing for the symptoms.     Review of Systems  Constitutional: Negative.  Negative for chills and fever.  HENT:  Positive for congestion. Negative for ear pain and sore throat.   Respiratory:  Positive for cough. Negative for shortness of breath.   Cardiovascular:  Negative for chest pain.  Gastrointestinal:  Negative for nausea.  Skin: Negative.  Negative for rash.  Neurological:  Positive for headaches.  All other systems reviewed and are negative.      Objective:    BP 140/82   Pulse 67   Temp 98.6 F (37 C) (Temporal)   Ht '5\' 4"'$  (1.626 m)   Wt 191 lb (86.6 kg)   SpO2 97%   BMI 32.79 kg/m  BP Readings from Last 3 Encounters:  06/13/21 140/82  04/26/21 124/88  03/21/21 (!) 145/82   Wt Readings from Last 3 Encounters:  06/13/21 191 lb (86.6 kg)  04/26/21 193 lb 6.4 oz (87.7 kg)  03/21/21 194 lb (88 kg)      Physical Exam Vitals and nursing note reviewed.  Constitutional:      Appearance: Normal appearance.  HENT:     Head: Normocephalic.     Right Ear: External ear  normal.     Left Ear: External ear normal.     Nose: Congestion present.     Mouth/Throat:     Mouth: Mucous membranes are moist.     Pharynx: Oropharynx is clear.  Eyes:     Conjunctiva/sclera: Conjunctivae normal.  Cardiovascular:     Rate and Rhythm: Normal rate and regular rhythm.     Pulses: Normal pulses.     Heart sounds: Normal heart sounds.  Pulmonary:     Effort: Pulmonary effort is normal.     Breath sounds: Normal breath sounds.  Abdominal:     General: Bowel sounds are normal.  Skin:    General: Skin is warm.     Findings: No rash.  Neurological:     General: No focal deficit present.     Mental Status: She is alert and oriented to person, place, and time.  Psychiatric:        Behavior: Behavior normal.    No results found for any visits on 06/13/21.      Assessment & Plan:  Take meds as prescribed - Use a cool mist humidifier  -Use saline nose sprays frequently -Force fluids -For fever or aches or pains- take Tylenol or ibuprofen. -If symptoms do not improve, she may need to be COVID tested to rule this out Follow up  with worsening unresolved symptoms  Problem List Items Addressed This Visit   None Visit Diagnoses     Acute bronchitis, unspecified organism    -  Primary   Relevant Medications   predniSONE (DELTASONE) 20 MG tablet   amoxicillin-clavulanate (AUGMENTIN) 875-125 MG tablet   Subacute cough       Relevant Medications   benzonatate (TESSALON PERLES) 100 MG capsule   guaiFENesin (MUCINEX) 600 MG 12 hr tablet       Meds ordered this encounter  Medications   predniSONE (DELTASONE) 20 MG tablet    Sig: Take 1 tablet (20 mg total) by mouth daily with breakfast.    Dispense:  6 tablet    Refill:  0    Order Specific Question:   Supervising Provider    Answer:   Jeneen Rinks   benzonatate (TESSALON PERLES) 100 MG capsule    Sig: Take 1 capsule (100 mg total) by mouth 3 (three) times daily as needed.    Dispense:  20 capsule     Refill:  0    Order Specific Question:   Supervising Provider    Answer:   Claretta Fraise [[id number]]   guaiFENesin (MUCINEX) 600 MG 12 hr tablet    Sig: Take 1 tablet (600 mg total) by mouth 2 (two) times daily.    Dispense:  30 tablet    Refill:  0    Order Specific Question:   Supervising Provider    Answer:   Claretta Fraise [[id number]]   amoxicillin-clavulanate (AUGMENTIN) 875-125 MG tablet    Sig: Take 1 tablet by mouth 2 (two) times daily.    Dispense:  14 tablet    Refill:  0    Order Specific Question:   Supervising Provider    Answer:   Claretta Fraise [phone]    Return if symptoms worsen or fail to improve.  Ivy Lynn, NP

## 2021-06-23 ENCOUNTER — Telehealth: Payer: Self-pay | Admitting: Family

## 2021-06-23 DIAGNOSIS — I10 Essential (primary) hypertension: Secondary | ICD-10-CM

## 2021-06-23 MED ORDER — ATENOLOL 25 MG PO TABS: 25.0000 mg | ORAL_TABLET | Freq: Every day | ORAL | 0 refills | Status: DC

## 2021-06-23 NOTE — Telephone Encounter (Signed)
Pt aware refill sent to pharmacy 

## 2021-06-23 NOTE — Telephone Encounter (Signed)
  Prescription Request  06/23/2021  Is this a "Controlled Substance" medicine? no  Have you seen your PCP in the last 2 weeks? no  If YES, route message to pool  -  If NO, patient needs to be scheduled for appointment.  What is the name of the medication or equipment? Atenolol 25 mg  Have you contacted your pharmacy to request a refill? yes   Which pharmacy would you like this sent to? [facility] in [place]   Patient notified that their request is being sent to the clinical staff for review and that they should receive a response within 2 business days.

## 2021-06-28 DIAGNOSIS — M5416 Radiculopathy, lumbar region: Secondary | ICD-10-CM | POA: Diagnosis not present

## 2021-07-05 ENCOUNTER — Other Ambulatory Visit: Payer: Self-pay | Admitting: Family

## 2021-08-04 ENCOUNTER — Ambulatory Visit: Payer: Medicare Other | Admitting: Family

## 2021-08-08 ENCOUNTER — Encounter: Payer: Self-pay | Admitting: Family

## 2021-08-08 ENCOUNTER — Ambulatory Visit: Payer: Medicare Other | Admitting: Family

## 2021-08-19 ENCOUNTER — Other Ambulatory Visit: Payer: Self-pay | Admitting: Family

## 2021-08-19 DIAGNOSIS — E785 Hyperlipidemia, unspecified: Secondary | ICD-10-CM

## 2021-08-22 ENCOUNTER — Ambulatory Visit (INDEPENDENT_AMBULATORY_CARE_PROVIDER_SITE_OTHER): Payer: Medicare Other | Admitting: Family

## 2021-08-22 ENCOUNTER — Encounter: Payer: Self-pay | Admitting: Family

## 2021-08-22 ENCOUNTER — Other Ambulatory Visit: Payer: Self-pay | Admitting: Family

## 2021-08-22 VITALS — BP 111/67 | HR 56 | Temp 97.6°F | Ht 64.0 in | Wt 189.6 lb

## 2021-08-22 DIAGNOSIS — M159 Polyosteoarthritis, unspecified: Secondary | ICD-10-CM

## 2021-08-22 DIAGNOSIS — Z0000 Encounter for general adult medical examination without abnormal findings: Secondary | ICD-10-CM | POA: Diagnosis not present

## 2021-08-22 DIAGNOSIS — M549 Dorsalgia, unspecified: Secondary | ICD-10-CM

## 2021-08-22 DIAGNOSIS — Z0001 Encounter for general adult medical examination with abnormal findings: Secondary | ICD-10-CM

## 2021-08-22 DIAGNOSIS — N898 Other specified noninflammatory disorders of vagina: Secondary | ICD-10-CM | POA: Diagnosis not present

## 2021-08-22 DIAGNOSIS — K219 Gastro-esophageal reflux disease without esophagitis: Secondary | ICD-10-CM | POA: Diagnosis not present

## 2021-08-22 DIAGNOSIS — G4733 Obstructive sleep apnea (adult) (pediatric): Secondary | ICD-10-CM | POA: Diagnosis not present

## 2021-08-22 DIAGNOSIS — H35371 Puckering of macula, right eye: Secondary | ICD-10-CM | POA: Diagnosis not present

## 2021-08-22 DIAGNOSIS — M5430 Sciatica, unspecified side: Secondary | ICD-10-CM

## 2021-08-22 DIAGNOSIS — Z961 Presence of intraocular lens: Secondary | ICD-10-CM | POA: Diagnosis not present

## 2021-08-22 DIAGNOSIS — E785 Hyperlipidemia, unspecified: Secondary | ICD-10-CM

## 2021-08-22 DIAGNOSIS — H43813 Vitreous degeneration, bilateral: Secondary | ICD-10-CM | POA: Diagnosis not present

## 2021-08-22 DIAGNOSIS — I10 Essential (primary) hypertension: Secondary | ICD-10-CM | POA: Diagnosis not present

## 2021-08-22 DIAGNOSIS — D0511 Intraductal carcinoma in situ of right breast: Secondary | ICD-10-CM | POA: Diagnosis not present

## 2021-08-22 DIAGNOSIS — H40013 Open angle with borderline findings, low risk, bilateral: Secondary | ICD-10-CM | POA: Diagnosis not present

## 2021-08-22 DIAGNOSIS — H25812 Combined forms of age-related cataract, left eye: Secondary | ICD-10-CM | POA: Diagnosis not present

## 2021-08-22 DIAGNOSIS — H04123 Dry eye syndrome of bilateral lacrimal glands: Secondary | ICD-10-CM | POA: Diagnosis not present

## 2021-08-22 LAB — WET PREP FOR TRICH, YEAST, CLUE
Clue Cell Exam: NEGATIVE
Trichomonas Exam: NEGATIVE
Yeast Exam: POSITIVE — AB

## 2021-08-22 MED ORDER — FLUCONAZOLE 150 MG PO TABS: 150.0000 mg | ORAL_TABLET | ORAL | 0 refills | Status: DC | PRN

## 2021-08-22 MED ORDER — HYDROCHLOROTHIAZIDE 25 MG PO TABS: 25.0000 mg | ORAL_TABLET | Freq: Every day | ORAL | 3 refills | Status: DC

## 2021-08-22 MED ORDER — ATENOLOL 25 MG PO TABS: 25.0000 mg | ORAL_TABLET | Freq: Every day | ORAL | 3 refills | Status: DC

## 2021-08-22 NOTE — Progress Notes (Signed)
Subjective:    Patient ID: Laura James, female    DOB: 29-Apr-1950, 71 y.o.   MRN: [id number]  Chief Complaint  Patient presents with   Medical Management of Chronic Issues   Cyst    On right arm and head. Since radiation    Skin Problem    Dark spots since radiation    Pt presents to the office today for CPE and  chronic follow up.  PT is followed by Ortho as needed  for back pain and bursitis of hip.  She has hx right breast cancer and followed by Oncologists annually.   She has OSA and uses CPAP nightly.   She is currently doing water aerobics twice a week.  Hypertension This is a chronic problem. The current episode started more than 1 year ago. The problem has been resolved since onset. The problem is controlled. Associated symptoms include peripheral edema. Pertinent negatives include no malaise/fatigue or shortness of breath. Risk factors for coronary artery disease include dyslipidemia, obesity and sedentary lifestyle. The current treatment provides moderate improvement. There is no history of heart failure.  Gastroesophageal Reflux She complains of belching and heartburn. This is a chronic problem. The current episode started more than 1 year ago. The problem occurs occasionally. Risk factors include obesity. The treatment provided moderate relief.  Back Pain This is a chronic problem. The current episode started more than 1 year ago. The problem occurs intermittently. The problem has been waxing and waning since onset. The pain is present in the lumbar spine. The quality of the pain is described as aching. The pain is at a severity of 5/10. The pain is moderate. Risk factors include obesity. She has tried NSAIDs for the symptoms. The treatment provided mild relief.  Arthritis Presents for follow-up visit. She complains of pain and stiffness. Affected locations include the left hip and right hip. Her pain is at a severity of 5/10.  Hyperlipidemia This is a chronic problem. The  current episode started more than 1 year ago. The problem is controlled. Recent lipid tests were reviewed and are normal. Exacerbating diseases include obesity. Pertinent negatives include no shortness of breath. Current antihyperlipidemic treatment includes statins. The current treatment provides moderate improvement of lipids. Risk factors for coronary artery disease include dyslipidemia, hypertension, a sedentary lifestyle, post-menopausal and obesity.  Vaginal Discharge The patient's primary symptoms include genital itching and vaginal discharge. This is a new problem. The current episode started more than 1 month ago. The problem occurs intermittently. Associated symptoms include back pain.      Review of Systems  Constitutional:  Negative for malaise/fatigue.  Respiratory:  Negative for shortness of breath.   Gastrointestinal:  Positive for heartburn.  Genitourinary:  Positive for vaginal discharge.  Musculoskeletal:  Positive for arthritis, back pain and stiffness.  All other systems reviewed and are negative.  Family History  Problem Relation Age of Onset   Hypertension Mother    Arthritis Mother    Osteoporosis Mother    Kidney disease Mother        related to APAP use   Lung cancer Father    Hypertension Brother    Hypertension Daughter    Diabetes Daughter    Diabetes Son    Diabetes Other    Clotting disorder Other    Colon cancer Neg Hx    Social History   Socioeconomic History   Marital status: Married    Spouse name: Francee Piccolo   Number of children: 3  Years of education: 59   Highest education level: Master's degree (e.g., MA, MS, MEng, MEd, MSW, MBA)  Occupational History   Occupation: Hospice- Chaplain    Comment: [employer]   Occupation: retired  Tobacco Use   Smoking status: Former    Packs/day: 0.50    Years: 5.00    Total pack years: 2.50    Types: Cigarettes    Quit date: 01/24/1968    Years since quitting: 53.6   Smokeless tobacco: Never   Vaping Use   Vaping Use: Never used  Substance and Sexual Activity   Alcohol use: No    Alcohol/week: 0.0 standard drinks of alcohol   Drug use: No   Sexual activity: Yes  Other Topics Concern   Not on file  Social History Narrative   Lives at home with husband    Social Determinants of Health   Financial Resource Strain: Low Risk  (12/21/2020)   Overall Financial Resource Strain (CARDIA)    Difficulty of Paying Living Expenses: Not hard at all  Food Insecurity: No Food Insecurity (12/21/2020)   Hunger Vital Sign    Worried About Running Out of Food in the Last Year: Never true    [place] in the Last Year: Never true  Transportation Needs: No Transportation Needs (12/21/2020)   PRAPARE - Hydrologist (Medical): No    Lack of Transportation (Non-Medical): No  Physical Activity: Insufficiently Active (12/21/2020)   Exercise Vital Sign    Days of Exercise per Week: 4 days    Minutes of Exercise per Session: 20 min  Stress: No Stress Concern Present (12/21/2020)   [place]    Feeling of Stress : Only a little  Social Connections: Socially Integrated (12/21/2020)   Social Connection and Isolation Panel [NHANES]    Frequency of Communication with Friends and Family: More than three times a week    Frequency of Social Gatherings with Friends and Family: More than three times a week    Attends Religious Services: More than 4 times per year    Active Member of [employer] or Organizations: Yes    Attends Music therapist: More than 4 times per year    Marital Status: Married       Objective:   Physical Exam Vitals reviewed.  Constitutional:      General: She is not in acute distress.    Appearance: She is well-developed. She is obese.  HENT:     Head: Normocephalic and atraumatic.     Right Ear: Tympanic membrane normal.     Left Ear: Tympanic membrane normal.   Eyes:     Pupils: Pupils are equal, round, and reactive to light.  Neck:     Thyroid: No thyromegaly.  Cardiovascular:     Rate and Rhythm: Normal rate and regular rhythm.     Heart sounds: Normal heart sounds. No murmur heard. Pulmonary:     Effort: Pulmonary effort is normal. No respiratory distress.     Breath sounds: Normal breath sounds. No wheezing.  Abdominal:     General: Bowel sounds are normal. There is no distension.     Palpations: Abdomen is soft.     Tenderness: There is no abdominal tenderness.  Musculoskeletal:        General: No tenderness. Normal range of motion.     Cervical back: Normal range of motion and neck supple.  Right lower leg: Edema (trace) present.     Left lower leg: Edema (trace) present.  Skin:    General: Skin is warm and dry.  Neurological:     Mental Status: She is alert and oriented to person, place, and time.     Cranial Nerves: No cranial nerve deficit.     Deep Tendon Reflexes: Reflexes are normal and symmetric.  Psychiatric:        Behavior: Behavior normal.        Thought Content: Thought content normal.        Judgment: Judgment normal.          BP 111/67   Pulse (!) 56   Temp 97.6 F (36.4 C) (Oral)   Ht _0  (1.626 m)   Wt 189 lb 9.6 oz (86 kg)   SpO2 97%   BMI 32.54 kg/m   Assessment & Plan:  Knox Holdman Mancillas comes in today with chief complaint of Medical Management of Chronic Issues, Cyst (On right arm and head. Since radiation ), and Skin Problem (Dark spots since radiation )   Diagnosis and orders addressed:  1. Essential hypertension, benign - atenolol (TENORMIN) 25 MG tablet; Take 1 tablet (25 mg total) by mouth daily. (Needs to be seen before next refill)  Dispense: 90 tablet; Refill: 3 - hydrochlorothiazide (HYDRODIURIL) 25 MG tablet; Take 1 tablet (25 mg total) by mouth daily.  Dispense: 90 tablet; Refill: 3 - CMP14+EGFR - CBC with Differential/Platelet  2. Annual physical exam - CMP14+EGFR - CBC with  Differential/Platelet - Lipid panel - TSH  3. Obstructive sleep apnea - CMP14+EGFR - CBC with Differential/Platelet  4. Gastroesophageal reflux disease without esophagitis - CMP14+EGFR - CBC with Differential/Platelet  5. Back pain with sciatica - CMP14+EGFR - CBC with Differential/Platelet  6. Primary osteoarthritis involving multiple joints - CMP14+EGFR - CBC with Differential/Platelet  7. Hyperlipidemia, unspecified hyperlipidemia type - CMP14+EGFR - CBC with Differential/Platelet - Lipid panel  8. Ductal carcinoma in situ (DCIS) of right breast - CMP14+EGFR - CBC with Differential/Platelet  9. Vaginal discharge - WET PREP FOR [place], YEAST, [place] pending Health Maintenance reviewed Diet and exercise encouraged  Follow up plan: 6 months    Evelina Dun, FNP

## 2021-08-22 NOTE — Patient Instructions (Signed)
Health Maintenance After Age 73 After age 73, you are at a higher risk for certain long-term diseases and infections as well as injuries from falls. Falls are a major cause of broken bones and head injuries in people who are older than age 73. Getting regular preventive care can help to keep you healthy and well. Preventive care includes getting regular testing and making lifestyle changes as recommended by your health care provider. Talk with your health care provider about: Which screenings and tests you should have. A screening is a test that checks for a disease when you have no symptoms. A diet and exercise plan that is right for you. What should I know about screenings and tests to prevent falls? Screening and testing are the best ways to find a health problem early. Early diagnosis and treatment give you the best chance of managing medical conditions that are common after age 73. Certain conditions and lifestyle choices may make you more likely to have a fall. Your health care provider may recommend: Regular vision checks. Poor vision and conditions such as cataracts can make you more likely to have a fall. If you wear glasses, make sure to get your prescription updated if your vision changes. Medicine review. Work with your health care provider to regularly review all of the medicines you are taking, including over-the-counter medicines. Ask your health care provider about any side effects that may make you more likely to have a fall. Tell your health care provider if any medicines that you take make you feel dizzy or sleepy. Strength and balance checks. Your health care provider may recommend certain tests to check your strength and balance while standing, walking, or changing positions. Foot health exam. Foot pain and numbness, as well as not wearing proper footwear, can make you more likely to have a fall. Screenings, including: Osteoporosis screening. Osteoporosis is a condition that causes  the bones to get weaker and break more easily. Blood pressure screening. Blood pressure changes and medicines to control blood pressure can make you feel dizzy. Depression screening. You may be more likely to have a fall if you have a fear of falling, feel depressed, or feel unable to do activities that you used to do. Alcohol use screening. Using too much alcohol can affect your balance and may make you more likely to have a fall. Follow these instructions at home: Lifestyle Do not drink alcohol if: Your health care provider tells you not to drink. If you drink alcohol: Limit how much you have to: 0-1 drink a day for women. 0-2 drinks a day for men. Know how much alcohol is in your drink. In the [place] one drink equals one 12 oz bottle of beer (355 mL), one 5 oz glass of wine (148 mL), or one 1 oz glass of hard liquor (44 mL). Do not use any products that contain nicotine or tobacco. These products include cigarettes, chewing tobacco, and vaping devices, such as e-cigarettes. If you need help quitting, ask your health care provider. Activity  Follow a regular exercise program to stay fit. This will help you maintain your balance. Ask your health care provider what types of exercise are appropriate for you. If you need a cane or walker, use it as recommended by your health care provider. Wear supportive shoes that have nonskid soles. Safety  Remove any tripping hazards, such as rugs, cords, and clutter. Install safety equipment such as grab bars in bathrooms and safety rails on stairs. Keep rooms and walkways   well-lit. General instructions Talk with your health care provider about your risks for falling. Tell your health care provider if: You fall. Be sure to tell your health care provider about all falls, even ones that seem minor. You feel dizzy, tiredness (fatigue), or off-balance. Take over-the-counter and prescription medicines only as told by your health care provider. These include  supplements. Eat a healthy diet and maintain a healthy weight. A healthy diet includes low-fat dairy products, low-fat (lean) meats, and fiber from whole grains, beans, and lots of fruits and vegetables. Stay current with your vaccines. Schedule regular health, dental, and eye exams. Summary Having a healthy lifestyle and getting preventive care can help to protect your health and wellness after age 73. Screening and testing are the best way to find a health problem early and help you avoid having a fall. Early diagnosis and treatment give you the best chance for managing medical conditions that are more common for people who are older than age 73. Falls are a major cause of broken bones and head injuries in people who are older than age 73. Take precautions to prevent a fall at home. Work with your health care provider to learn what changes you can make to improve your health and wellness and to prevent falls. This information is not intended to replace advice given to you by your health care provider. Make sure you discuss any questions you have with your health care provider. Document Revised: 05/31/2020 Document Reviewed: 05/31/2020 Elsevier Patient Education  2023 Elsevier Inc.  

## 2021-08-23 LAB — CMP14+EGFR
ALT: 16 IU/L (ref 0–32)
AST: 22 IU/L (ref 0–40)
Albumin/Globulin Ratio: 1.4 (ref 1.2–2.2)
Albumin: 3.7 g/dL — ABNORMAL LOW (ref 3.8–4.8)
Alkaline Phosphatase: 63 IU/L (ref 44–121)
BUN/Creatinine Ratio: 14 (ref 12–28)
BUN: 13 mg/dL (ref 8–27)
Bilirubin Total: <0.2 mg/dL (ref 0.0–1.2)
CO2: 24 mmol/L (ref 20–29)
Calcium: 9.3 mg/dL (ref 8.7–10.3)
Chloride: 105 mmol/L (ref 96–106)
Creatinine, Ser: 0.92 mg/dL (ref 0.57–1.00)
Globulin, Total: 2.6 g/dL (ref 1.5–4.5)
Glucose: 105 mg/dL — ABNORMAL HIGH (ref 70–99)
Potassium: 3.9 mmol/L (ref 3.5–5.2)
Sodium: 142 mmol/L (ref 134–144)
Total Protein: 6.3 g/dL (ref 6.0–8.5)
eGFR: 66 mL/min/{1.73_m2} (ref >59)

## 2021-08-23 LAB — CBC WITH DIFFERENTIAL/PLATELET
Basophils Absolute: 0 10*3/uL (ref 0.0–0.2)
Basos: 0 %
EOS (ABSOLUTE): 0.3 10*3/uL (ref 0.0–0.4)
Eos: 5 %
Hematocrit: 41.3 % (ref 34.0–46.6)
Hemoglobin: 13.4 g/dL (ref 11.1–15.9)
Immature Grans (Abs): 0 10*3/uL (ref 0.0–0.1)
Immature Granulocytes: 0 %
Lymphocytes Absolute: 1.7 10*3/uL (ref 0.7–3.1)
Lymphs: 28 %
MCH: 29.3 pg (ref 26.6–33.0)
MCHC: 32.4 g/dL (ref 31.5–35.7)
MCV: 90 fL (ref 79–97)
Monocytes Absolute: 0.4 10*3/uL (ref 0.1–0.9)
Monocytes: 7 %
Neutrophils Absolute: 3.5 10*3/uL (ref 1.4–7.0)
Neutrophils: 60 %
Platelets: 236 10*3/uL (ref 150–450)
RBC: 4.57 x10E6/uL (ref 3.77–5.28)
RDW: 12.9 % (ref 11.7–15.4)
WBC: 5.9 10*3/uL (ref 3.4–10.8)

## 2021-08-23 LAB — LIPID PANEL
Chol/HDL Ratio: 3.5 ratio (ref 0.0–4.4)
Cholesterol, Total: 123 mg/dL (ref 100–199)
HDL: 35 mg/dL — ABNORMAL LOW (ref >39)
LDL Chol Calc (NIH): 54 mg/dL (ref 0–99)
Triglycerides: 209 mg/dL — ABNORMAL HIGH (ref 0–149)
VLDL Cholesterol Cal: 34 mg/dL (ref 5–40)

## 2021-08-23 LAB — TSH: TSH: 1.43 u[IU]/mL (ref 0.450–4.500)

## 2021-09-01 ENCOUNTER — Ambulatory Visit
Admission: RE | Admit: 2021-09-01 | Discharge: 2021-09-01 | Disposition: A | Discharge status: Home / Self Care | Payer: Medicare Other | Source: Ambulatory Visit | Attending: Adult Health | Admitting: Adult Health

## 2021-09-01 DIAGNOSIS — Z853 Personal history of malignant neoplasm of breast: Secondary | ICD-10-CM | POA: Diagnosis not present

## 2021-09-01 DIAGNOSIS — R928 Other abnormal and inconclusive findings on diagnostic imaging of breast: Secondary | ICD-10-CM | POA: Diagnosis not present

## 2021-09-01 DIAGNOSIS — D0511 Intraductal carcinoma in situ of right breast: Secondary | ICD-10-CM

## 2021-09-01 HISTORY — DX: Personal history of irradiation: Z92.3

## 2021-10-23 ENCOUNTER — Other Ambulatory Visit: Payer: Self-pay | Admitting: Gastroenterology

## 2021-10-25 NOTE — Progress Notes (Signed)
Patient Care Team: Sharion Balloon, FNP as PCP - General (Family Medicine) Lynnette Caffey Virl Diamond, MD as Consulting Physician (Radiation Oncology) Nicholas Lose, MD as Consulting Physician (Hematology and Oncology) Daryel November, MD as Consulting Physician (Gastroenterology) Rigoberto Noel, MD as Consulting Physician (Pulmonary Disease) Rolm Bookbinder, MD as Consulting Physician (General Surgery)  DIAGNOSIS:  Encounter Diagnosis  Name Primary?   Ductal carcinoma in situ (DCIS) of right breast     SUMMARY OF ONCOLOGIC HISTORY: Oncology History  Ductal carcinoma in situ (DCIS) of right breast  09/13/2020 Initial Diagnosis   Screening mammogram: calcifications in the right breast. Diagnostic mammogram and [place]: 1 cm group of indeterminate calcifications within the upper outer right breast.    10/13/2020 Surgery   Right lumpectomy: intermediate grade DCIS 0.6 cm with margins uninvolved by carcinoma.  ER 95%, PR 90%   10/13/2020 Cancer Staging   Staging form: Breast, AJCC 8th Edition - Pathologic stage from 10/13/2020: Stage 0 (pTis (DCIS), pN0, cM0) - Signed by Gardenia Phlegm, NP on 04/26/2021 Stage prefix: Initial diagnosis   11/29/2020 - 12/22/2020 Radiation Therapy   Site: Right Breast Technique: 3D CRT Goal: Curative Whole Breast 4272 cGy in 267 cGy daily fractions (16 Fractions)   01/27/2021 -  Anti-estrogen oral therapy   Tamoxifen     CHIEF COMPLIANT: Follow-up DCIS on tamoxifen  INTERVAL HISTORY: Laura James is a 71 y.o. female is here because of recent diagnosis of DCIS of the right breast. She presents to the clinic for a follow-up. She states that she has been doing good but she has not been tolerating the tamoxifen. She complains of a burning discharge. If she doesn't wash and Vaseline she reports it gets dry and she bleeds. [facility] She does have a concern about the pulling under the arm when she  exercise.    ALLERGIES:  is allergic to adhesive [tape] and latex.  MEDICATIONS:  Current Outpatient Medications  Medication Sig Dispense Refill   albuterol (PROAIR HFA) 108 (90 Base) MCG/ACT inhaler INHALE 2 PUFFS BY MOUTH EVERY 6 HOURS AS NEEDED FOR WHEEZE OR SHORTNESS OF BREATH 8.5 each 2   aspirin 81 MG tablet Take 81 mg by mouth daily.     atenolol (TENORMIN) 25 MG tablet Take 1 tablet (25 mg total) by mouth daily. (Needs to be seen before next refill) 90 tablet 3   atorvastatin (LIPITOR) 10 MG tablet TAKE 1 TABLET BY MOUTH EVERY DAY 90 tablet 0   atorvastatin (LIPITOR) 10 MG tablet Take 1 tablet by mouth daily.     cetirizine (ZYRTEC) 10 MG tablet Take 10 mg by mouth daily as needed.      cholecalciferol (VITAMIN D) 1000 units tablet Take 1 tablet (1,000 Units total) by mouth daily.     Cyanocobalamin (B-12) 3000 MCG CAPS Take 1 capsule by mouth daily.     dicyclomine (BENTYL) 20 MG tablet Take 1 tablet (20 mg total) by mouth every 6 (six) hours. 30 tablet 3   docusate sodium (COLACE) 100 MG capsule Take 200 mg by mouth 2 (two) times daily.     fluconazole (DIFLUCAN) 150 MG tablet Take 1 tablet (150 mg total) by mouth every three (3) days as needed. 3 tablet 0   fluticasone (FLONASE) 50 MCG/ACT nasal spray SPRAY 2 SPRAYS INTO EACH NOSTRIL EVERY DAY 48 mL 1   hydrochlorothiazide (HYDRODIURIL) 25 MG tablet Take 1 tablet (25 mg total) by  mouth daily. 90 tablet 3   magnesium oxide (MAG-OX) 400 MG tablet Take 400 mg by mouth daily.     methocarbamol (ROBAXIN) 500 MG tablet Take 1 tablet by mouth 4 (four) times daily.     omeprazole (PRILOSEC OTC) 20 MG tablet Take 20 mg by mouth daily. Pt takes 1/2 tablet in the morning and 1/2 tablet in the evening     tamoxifen (NOLVADEX) 10 MG tablet Take 1 tablet (10 mg total) by mouth daily. 90 tablet 3   traMADol (ULTRAM) 50 MG tablet TAKE 1 TABLET BY MOUTH THREE TIMES A DAY AS NEEDED FOR PAIN FOR 5 DAYS     Vitamin D, Ergocalciferol, (DRISDOL) 1.25  MG (50000 UNIT) CAPS capsule TAKE 1 TABLET BY MOUTH 2 TIMES DAILY WITH MEAL     No current facility-administered medications for this visit.    PHYSICAL EXAMINATION: ECOG PERFORMANCE STATUS: 1 - Symptomatic but completely ambulatory  Vitals:   10/28/21 1108  BP: (!) 139/92  Pulse: (!) 58  Resp: 18  Temp: (!) 97.5 F (36.4 C)  SpO2: 98%   Filed Weights   10/28/21 1108  Weight: 186 lb 8 oz (84.6 kg)    BREAST: No palpable masses or nodules in either right or left breasts. No palpable axillary supraclavicular or infraclavicular adenopathy no breast tenderness or nipple discharge. (exam performed in the presence of a chaperone)  LABORATORY DATA:  I have reviewed the data as listed    Latest Ref Rng & Units 08/22/2021   12:44 PM 12/23/2020   12:41 PM 10/07/2020    2:42 PM  CMP  Glucose 70 - 99 mg/dL 105  78  116   BUN 8 - 27 mg/dL '13  13  13   '$ Creatinine 0.57 - 1.00 mg/dL 0.92  1.06  0.97   Sodium 134 - 144 mmol/L 142  140  137   Potassium 3.5 - 5.2 mmol/L 3.9  4.0  3.8   Chloride 96 - 106 mmol/L 105  102  103   CO2 20 - 29 mmol/L '24  26  28   '$ Calcium 8.7 - 10.3 mg/dL 9.3  9.6  9.4   Total Protein 6.0 - 8.5 g/dL 6.3  6.3    Total Bilirubin 0.0 - 1.2 mg/dL <0.2  0.2    Alkaline Phos 44 - 121 IU/L 63  88    AST 0 - 40 IU/L 22  21    ALT 0 - 32 IU/L 16  17      Lab Results  Component Value Date   WBC 5.9 08/22/2021   HGB 13.4 08/22/2021   HCT 41.3 08/22/2021   MCV 90 08/22/2021   PLT 236 08/22/2021   NEUTROABS 3.5 08/22/2021    ASSESSMENT & PLAN:  Ductal carcinoma in situ (DCIS) of right breast 10/13/2020:Right lumpectomy: intermediate grade DCIS 0.6 cm with margins uninvolved by carcinoma.  ER 95%, PR 90%    Recommendation: 1. Followed by adjuvant radiation therapy (to be done at [facility]) completed 12/21/20 2. Followed by antiestrogen therapy with tamoxifen 5 years to be started 01/27/21   Treatment plan: Tamoxifen started 01/27/21 10 mg daily   Tamoxifen  toxicities: 1.  Vaginal discharge: This is annoying and burning and irritating her.  She has to continuously clean up otherwise it gets much worse.  I recommended that she stop tamoxifen until the symptoms resolved. After that point she can choose to take 5 mg of tamoxifen daily.  If her symptoms recur then  she will stop it permanently.  Breast cancer surveillance: 1.  Breast exam 10/27/2021: Benign 2. Mammogram 09/01/21: Benign Density Cat B  RTC in 1 year    No orders of the defined types were placed in this encounter.  The patient has a good understanding of the overall plan. she agrees with it. she will call with any problems that may develop before the next visit here. Total time spent: 30 mins including face to face time and time spent for planning, charting and co-ordination of care   Harriette Ohara, MD 10/28/21    I Gardiner Coins am scribing for Dr. Lindi Adie  I have reviewed the above documentation for accuracy and completeness, and I agree with the above.

## 2021-10-27 NOTE — Assessment & Plan Note (Addendum)
10/13/2020:Right lumpectomy: intermediate grade DCIS 0.6 cm with margins uninvolved by carcinoma. ER 95%, PR 90%  Recommendation: 1. Followed by adjuvant radiation therapy(to be done at [facility]) completed 12/21/20 2. Followed by antiestrogen therapy with tamoxifen 5 years to be started 01/27/21  Treatment plan: Tamoxifen started 01/27/21 10 mg daily  Tamoxifen toxicities: 1.  Vaginal discharge: This is annoying and burning and irritating her.  She has to continuously clean up otherwise it gets much worse.  I recommended that she stop tamoxifen until the symptoms resolved. After that point she can choose to take 5 mg of tamoxifen daily.  If her symptoms recur then she will stop it permanently.  Breast cancer surveillance: 1.  Breast exam 10/27/2021: Benign 2. Mammogram 09/01/21: Benign Density Cat B  RTC in 1 year

## 2021-10-28 ENCOUNTER — Inpatient Hospital Stay: Payer: Medicare Other | Attending: Hematology and Oncology | Admitting: Hematology and Oncology

## 2021-10-28 DIAGNOSIS — Z79810 Long term (current) use of selective estrogen receptor modulators (SERMs): Secondary | ICD-10-CM | POA: Insufficient documentation

## 2021-10-28 DIAGNOSIS — Z79899 Other long term (current) drug therapy: Secondary | ICD-10-CM | POA: Insufficient documentation

## 2021-10-28 DIAGNOSIS — D0511 Intraductal carcinoma in situ of right breast: Secondary | ICD-10-CM | POA: Insufficient documentation

## 2021-10-28 DIAGNOSIS — Z923 Personal history of irradiation: Secondary | ICD-10-CM | POA: Insufficient documentation

## 2021-10-28 DIAGNOSIS — Z170 Estrogen receptor positive status [ER+]: Secondary | ICD-10-CM | POA: Diagnosis not present

## 2021-10-28 DIAGNOSIS — Z7982 Long term (current) use of aspirin: Secondary | ICD-10-CM | POA: Insufficient documentation

## 2021-10-28 DIAGNOSIS — N898 Other specified noninflammatory disorders of vagina: Secondary | ICD-10-CM | POA: Diagnosis not present

## 2021-10-31 ENCOUNTER — Telehealth: Payer: Self-pay | Admitting: Hematology and Oncology

## 2021-10-31 NOTE — Telephone Encounter (Signed)
Scheduled appointment per 10/6 los. Patient is aware.

## 2021-11-17 ENCOUNTER — Other Ambulatory Visit: Payer: Self-pay | Admitting: Family

## 2021-11-17 DIAGNOSIS — E785 Hyperlipidemia, unspecified: Secondary | ICD-10-CM

## 2021-11-18 DIAGNOSIS — D0511 Intraductal carcinoma in situ of right breast: Secondary | ICD-10-CM | POA: Diagnosis not present

## 2021-12-30 ENCOUNTER — Encounter: Payer: Self-pay | Admitting: Family Medicine

## 2021-12-30 ENCOUNTER — Ambulatory Visit (INDEPENDENT_AMBULATORY_CARE_PROVIDER_SITE_OTHER): Payer: Medicare Other | Admitting: Family Medicine

## 2021-12-30 VITALS — BP 128/69 | HR 78 | Temp 98.3°F | Ht 64.0 in | Wt 187.4 lb

## 2021-12-30 DIAGNOSIS — J4521 Mild intermittent asthma with (acute) exacerbation: Secondary | ICD-10-CM

## 2021-12-30 DIAGNOSIS — J069 Acute upper respiratory infection, unspecified: Secondary | ICD-10-CM | POA: Diagnosis not present

## 2021-12-30 MED ORDER — AZITHROMYCIN 250 MG PO TABS: ORAL_TABLET | ORAL | 0 refills | Status: DC

## 2021-12-30 MED ORDER — PROMETHAZINE-DM 6.25-15 MG/5ML PO SYRP: 5.0000 mL | ORAL_SOLUTION | Freq: Four times a day (QID) | ORAL | 0 refills | Status: DC | PRN

## 2021-12-30 MED ORDER — PREDNISONE 20 MG PO TABS: 40.0000 mg | ORAL_TABLET | Freq: Every day | ORAL | 0 refills | Status: AC

## 2021-12-30 MED ORDER — ALBUTEROL SULFATE HFA 108 (90 BASE) MCG/ACT IN AERS: INHALATION_SPRAY | RESPIRATORY_TRACT | 2 refills | Status: DC

## 2021-12-30 NOTE — Progress Notes (Signed)
Acute Office Visit  Subjective:     Patient ID: Laura James, female    DOB: 04-19-1950, 71 y.o.   MRN: [id number]  Chief Complaint  Patient presents with   Shortness of Breath   Cough    Cough This is a new problem. Episode onset: 5 days. The problem has been gradually worsening. The problem occurs every few minutes. The cough is Productive of sputum (yellow, clear). Associated symptoms include chills, nasal congestion, postnasal drip and shortness of breath (mild with coughing fits). Pertinent negatives include no chest pain, ear congestion, ear pain, fever, headaches, myalgias, sore throat, sweats or wheezing. Nothing aggravates the symptoms. Treatments tried: albuterol inhaler. The treatment provided moderate relief. Her past medical history is significant for asthma and bronchitis. There is no history of COPD, emphysema or pneumonia.    Review of Systems  Constitutional:  Positive for chills. Negative for fever.  HENT:  Positive for postnasal drip. Negative for ear pain and sore throat.   Respiratory:  Positive for shortness of breath (mild with coughing fits). Negative for wheezing.   Cardiovascular:  Negative for chest pain.  Musculoskeletal:  Negative for myalgias.  Neurological:  Negative for headaches.        Objective:    BP 128/69   Pulse 78   Temp 98.3 F (36.8 C) (Temporal)   Ht '5\' 4"'$  (1.626 m)   Wt 187 lb 6 oz (85 kg)   SpO2 94%   BMI 32.16 kg/m    Physical Exam Vitals and nursing note reviewed.  Constitutional:      General: She is not in acute distress.    Appearance: She is not ill-appearing, toxic-appearing or diaphoretic.  HENT:     Head: Normocephalic and atraumatic.     Right Ear: Tympanic membrane, ear canal and external ear normal.     Left Ear: Tympanic membrane, ear canal and external ear normal.     Nose: Congestion present.     Mouth/Throat:     Mouth: Mucous membranes are moist.     Pharynx: Oropharynx is clear. No oropharyngeal  exudate or posterior oropharyngeal erythema.  Eyes:     General:        Right eye: No discharge.        Left eye: No discharge.     Conjunctiva/sclera: Conjunctivae normal.  Cardiovascular:     Rate and Rhythm: Normal rate and regular rhythm.  Pulmonary:     Effort: Pulmonary effort is normal. No respiratory distress.     Breath sounds: Normal breath sounds. No wheezing, rhonchi or rales.  Musculoskeletal:     Cervical back: No rigidity.     Right lower leg: No edema.     Left lower leg: No edema.  Lymphadenopathy:     Cervical: No cervical adenopathy.  Skin:    General: Skin is warm and dry.  Neurological:     General: No focal deficit present.     Mental Status: She is alert and oriented to person, place, and time.  Psychiatric:        Mood and Affect: Mood normal.        Behavior: Behavior normal.     No results found for any visits on 12/30/21.      Assessment & Plan:   Laura James was seen today for shortness of breath and cough.  Diagnoses and all orders for this visit:  URI, acute Start zpak if symptoms worsen or do not improve over weekend. Discussed symptomatic  care and return precautions.  -     promethazine-dextromethorphan (PROMETHAZINE-DM) 6.25-15 MG/5ML syrup; Take 5 mLs by mouth 4 (four) times daily as needed for cough. -     azithromycin (ZITHROMAX Z-PAK) 250 MG tablet; As directed  Mild intermittent asthma with (acute) exacerbation Albuterol prn. Prednisone burst as below.  -     albuterol (PROAIR HFA) 108 (90 Base) MCG/ACT inhaler; INHALE 2 PUFFS BY MOUTH EVERY 6 HOURS AS NEEDED FOR WHEEZE OR SHORTNESS OF BREATH -     predniSONE (DELTASONE) 20 MG tablet; Take 2 tablets (40 mg total) by mouth daily with breakfast for 5 days.  Return to office for new or worsening symptoms, or if symptoms persist.   The patient indicates understanding of these issues and agrees with the plan.   Gwenlyn Perking, FNP

## 2022-02-22 ENCOUNTER — Other Ambulatory Visit: Payer: Self-pay | Admitting: Family

## 2022-02-22 DIAGNOSIS — E785 Hyperlipidemia, unspecified: Secondary | ICD-10-CM

## 2022-03-23 ENCOUNTER — Encounter: Payer: Self-pay | Admitting: Radiology

## 2022-03-23 ENCOUNTER — Ambulatory Visit (INDEPENDENT_AMBULATORY_CARE_PROVIDER_SITE_OTHER): Payer: Medicare Other

## 2022-03-23 VITALS — Ht 65.0 in | Wt 190.0 lb

## 2022-03-23 DIAGNOSIS — Z0000 Encounter for general adult medical examination without abnormal findings: Secondary | ICD-10-CM

## 2022-03-23 DIAGNOSIS — Z780 Asymptomatic menopausal state: Secondary | ICD-10-CM

## 2022-03-23 NOTE — Patient Instructions (Signed)
Laura James , Thank you for taking time to come for your Medicare Wellness Visit. I appreciate your ongoing commitment to your health goals. Please review the following plan we discussed and let me know if I can assist you in the future.   These are the goals we discussed:  Goals      Exercise 3x per week (30 min per time)     Increase walking to 3 times per week for 30 minutes each session     Increase physical activity     Pt has not maintained last year' s goal. She would like to do the same goal again and will start right away.   12/17/2019 AWV Goal: Exercise for General Health  Patient will verbalize understanding of the benefits of increased physical activity: Exercising regularly is important. It will improve your overall fitness, flexibility, and endurance. Regular exercise also will improve your overall health. It can help you control your weight, reduce stress, and improve your bone density. Over the next year, patient will increase physical activity as tolerated with a goal of at least 150 minutes of moderate physical activity per week.  You can tell that you are exercising at a moderate intensity if your heart starts beating faster and you start breathing faster but can still hold a conversation. Moderate-intensity exercise ideas include: Walking 1 mile (1.6 km) in about 15 minutes Biking Hiking Golfing Dancing Water aerobics Patient will verbalize understanding of everyday activities that increase physical activity by providing examples like the following: Yard work, such as: Sales promotion account executive Gardening Washing windows or floors Patient will be able to explain general safety guidelines for exercising:  Before you start a new exercise program, talk with your health care provider. Do not exercise so much that you hurt yourself, feel dizzy, or get very short of breath. Wear comfortable  clothes and wear shoes with good support. Drink plenty of water while you exercise to prevent dehydration or heat stroke. Work out until your breathing and your heartbeat get faster.         This is a list of the screening recommended for you and due dates:  Health Maintenance  Topic Date Due   COVID-19 Vaccine (7 - 2023-24 season) 02/12/2022   DEXA scan (bone density measurement)  01/14/2023*   Mammogram  09/02/2022   Medicare Annual Wellness Visit  03/23/2023   DTaP/Tdap/Td vaccine (4 - Td or Tdap) 12/24/2023   Colon Cancer Screening  03/21/2024   Pneumonia Vaccine  Completed   Flu Shot  Completed   Hepatitis C Screening: USPSTF Recommendation to screen - Ages 18-79 yo.  Completed   Zoster (Shingles) Vaccine  Completed   HPV Vaccine  Aged Out  *Topic was postponed. The date shown is not the original due date.    Advanced directives: Advance directive discussed with you today. I have provided a copy for you to complete at home and have notarized. Once this is complete please bring a copy in to our office so we can scan it into your chart.   Conditions/risks identified: Aim for 30 minutes of exercise or brisk walking, 6-8 glasses of water, and 5 servings of fruits and vegetables each day.   Next appointment: Follow up in one year for your annual wellness visit    Preventive Care 65 Years and Older, Female Preventive care refers to lifestyle choices and visits with your health care provider that  can promote health and wellness. What does preventive care include? A yearly physical exam. This is also called an annual well check. Dental exams once or twice a year. Routine eye exams. Ask your health care provider how often you should have your eyes checked. Personal lifestyle choices, including: Daily care of your teeth and gums. Regular physical activity. Eating a healthy diet. Avoiding tobacco and drug use. Limiting alcohol use. Practicing safe sex. Taking low-dose aspirin  every day. Taking vitamin and mineral supplements as recommended by your health care provider. What happens during an annual well check? The services and screenings done by your health care provider during your annual well check will depend on your age, overall health, lifestyle risk factors, and family history of disease. Counseling  Your health care provider may ask you questions about your: Alcohol use. Tobacco use. Drug use. Emotional well-being. Home and relationship well-being. Sexual activity. Eating habits. History of falls. Memory and ability to understand (cognition). Work and work Statistician. Reproductive health. Screening  You may have the following tests or measurements: Height, weight, and BMI. Blood pressure. Lipid and cholesterol levels. These may be checked every 5 years, or more frequently if you are over 33 years old. Skin check. Lung cancer screening. You may have this screening every year starting at age 31 if you have a 30-pack-year history of smoking and currently smoke or have quit within the past 15 years. Fecal occult blood test (FOBT) of the stool. You may have this test every year starting at age 25. Flexible sigmoidoscopy or colonoscopy. You may have a sigmoidoscopy every 5 years or a colonoscopy every 10 years starting at age 60. Hepatitis C blood test. Hepatitis B blood test. Sexually transmitted disease (STD) testing. Diabetes screening. This is done by checking your blood sugar (glucose) after you have not eaten for a while (fasting). You may have this done every 1-3 years. Bone density scan. This is done to screen for osteoporosis. You may have this done starting at age 64. Mammogram. This may be done every 1-2 years. Talk to your health care provider about how often you should have regular mammograms. Talk with your health care provider about your test results, treatment options, and if necessary, the need for more tests. Vaccines  Your health  care provider may recommend certain vaccines, such as: Influenza vaccine. This is recommended every year. Tetanus, diphtheria, and acellular pertussis (Tdap, Td) vaccine. You may need a Td booster every 10 years. Zoster vaccine. You may need this after age 2. Pneumococcal 13-valent conjugate (PCV13) vaccine. One dose is recommended after age 20. Pneumococcal polysaccharide (PPSV23) vaccine. One dose is recommended after age 46. Talk to your health care provider about which screenings and vaccines you need and how often you need them. This information is not intended to replace advice given to you by your health care provider. Make sure you discuss any questions you have with your health care provider. Document Released: 02/05/2015 Document Revised: 09/29/2015 Document Reviewed: 11/10/2014 Elsevier Interactive Patient Education  2017 [employer] Prevention in the Home Falls can cause injuries. They can happen to people of all ages. There are many things you can do to make your home safe and to help prevent falls. What can I do on the outside of my home? Regularly fix the edges of walkways and driveways and fix any cracks. Remove anything that might make you trip as you walk through a door, such as a raised step or threshold. Trim any  bushes or trees on the path to your home. Use bright outdoor lighting. Clear any walking paths of anything that might make someone trip, such as rocks or tools. Regularly check to see if handrails are loose or broken. Make sure that both sides of any steps have handrails. Any raised decks and porches should have guardrails on the edges. Have any leaves, snow, or ice cleared regularly. Use sand or salt on walking paths during winter. Clean up any spills in your garage right away. This includes oil or grease spills. What can I do in the bathroom? Use night lights. Install grab bars by the toilet and in the tub and shower. Do not use towel bars as grab  bars. Use non-skid mats or decals in the tub or shower. If you need to sit down in the shower, use a plastic, non-slip stool. Keep the floor dry. Clean up any water that spills on the floor as soon as it happens. Remove soap buildup in the tub or shower regularly. Attach bath mats securely with double-sided non-slip rug tape. Do not have throw rugs and other things on the floor that can make you trip. What can I do in the bedroom? Use night lights. Make sure that you have a light by your bed that is easy to reach. Do not use any sheets or blankets that are too big for your bed. They should not hang down onto the floor. Have a firm chair that has side arms. You can use this for support while you get dressed. Do not have throw rugs and other things on the floor that can make you trip. What can I do in the kitchen? Clean up any spills right away. Avoid walking on wet floors. Keep items that you use a lot in easy-to-reach places. If you need to reach something above you, use a strong step stool that has a grab bar. Keep electrical cords out of the way. Do not use floor polish or wax that makes floors slippery. If you must use wax, use non-skid floor wax. Do not have throw rugs and other things on the floor that can make you trip. What can I do with my stairs? Do not leave any items on the stairs. Make sure that there are handrails on both sides of the stairs and use them. Fix handrails that are broken or loose. Make sure that handrails are as long as the stairways. Check any carpeting to make sure that it is firmly attached to the stairs. Fix any carpet that is loose or worn. Avoid having throw rugs at the top or bottom of the stairs. If you do have throw rugs, attach them to the floor with carpet tape. Make sure that you have a light switch at the top of the stairs and the bottom of the stairs. If you do not have them, ask someone to add them for you. What else can I do to help prevent  falls? Wear shoes that: Do not have high heels. Have rubber bottoms. Are comfortable and fit you well. Are closed at the toe. Do not wear sandals. If you use a stepladder: Make sure that it is fully opened. Do not climb a closed stepladder. Make sure that both sides of the stepladder are locked into place. Ask someone to hold it for you, if possible. Clearly mark and make sure that you can see: Any grab bars or handrails. First and last steps. Where the edge of each step is. Use tools that  help you move around (mobility aids) if they are needed. These include: Canes. Walkers. Scooters. Crutches. Turn on the lights when you go into a dark area. Replace any light bulbs as soon as they burn out. Set up your furniture so you have a clear path. Avoid moving your furniture around. If any of your floors are uneven, fix them. If there are any pets around you, be aware of where they are. Review your medicines with your doctor. Some medicines can make you feel dizzy. This can increase your chance of falling. Ask your doctor what other things that you can do to help prevent falls. This information is not intended to replace advice given to you by your health care provider. Make sure you discuss any questions you have with your health care provider. Document Released: 11/05/2008 Document Revised: 06/17/2015 Document Reviewed: 02/13/2014 Elsevier Interactive Patient Education  2017 [employer].

## 2022-03-23 NOTE — Progress Notes (Signed)
Subjective:   Laura James is a 72 y.o. female who presents for Medicare Annual (Subsequent) preventive examination. I connected with  Reita Chard Radigan on 03/23/22 by a audio enabled telemedicine application and verified that I am speaking with the correct person using two identifiers.  Patient Location: Home  Provider Location: Home Office  I discussed the limitations of evaluation and management by telemedicine. The patient expressed understanding and agreed to proceed.  Review of Systems     Cardiac Risk Factors include: advanced age (>36mn, >>8women);hypertension;dyslipidemia     Objective:    Today's Vitals   03/23/22 0810  Weight: 190 lb (86.2 kg)  Height: '5\' 5"'$  (1.651 m)   Body mass index is 31.62 kg/m.     03/23/2022    8:13 AM 12/21/2020    2:15 PM 10/26/2020   10:36 AM 10/13/2020    6:51 AM 10/04/2020    2:02 PM 12/17/2019    8:42 AM 12/11/2018    8:35 AM  Advanced Directives  Does Patient Have a Medical Advance Directive? No Yes No No No Yes No  Type of ACorporate treasurerof [place] will    [place] will   Does patient want to make changes to medical advance directive?      No - Patient declined   Copy of HGautierin Chart?  No - copy requested    No - copy requested   Would patient like information on creating a medical advance directive? No - Patient declined  No - Patient declined Yes (MAU/Ambulatory/Procedural Areas - Information given) No - Patient declined  Yes (MAU/Ambulatory/Procedural Areas - Information given);Yes (ED - Information included in AVS)    Current Medications (verified) Outpatient Encounter Medications as of 03/23/2022  Medication Sig   albuterol (PROAIR HFA) 108 (90 Base) MCG/ACT inhaler INHALE 2 PUFFS BY MOUTH EVERY 6 HOURS AS NEEDED FOR WHEEZE OR SHORTNESS OF BREATH   aspirin 81 MG tablet Take 81 mg by mouth daily.   atenolol (TENORMIN) 25 MG tablet Take 1 tablet (25  mg total) by mouth daily. (Needs to be seen before next refill)   atorvastatin (LIPITOR) 10 MG tablet Take 1 tablet (10 mg total) by mouth daily. (NEEDS TO BE SEEN BEFORE NEXT REFILL)   azithromycin (ZITHROMAX Z-PAK) 250 MG tablet As directed   cetirizine (ZYRTEC) 10 MG tablet Take 10 mg by mouth daily as needed.    cholecalciferol (VITAMIN D) 1000 units tablet Take 1 tablet (1,000 Units total) by mouth daily.   Cyanocobalamin (B-12) 3000 MCG CAPS Take 1 capsule by mouth daily.   dicyclomine (BENTYL) 20 MG tablet Take 1 tablet (20 mg total) by mouth every 6 (six) hours.   docusate sodium (COLACE) 100 MG capsule Take 200 mg by mouth 2 (two) times daily.   fluticasone (FLONASE) 50 MCG/ACT nasal spray SPRAY 2 SPRAYS INTO EACH NOSTRIL EVERY DAY   hydrochlorothiazide (HYDRODIURIL) 25 MG tablet Take 1 tablet (25 mg total) by mouth daily.   magnesium oxide (MAG-OX) 400 MG tablet Take 400 mg by mouth daily.   methocarbamol (ROBAXIN) 500 MG tablet Take 1 tablet by mouth 4 (four) times daily.   omeprazole (PRILOSEC OTC) 20 MG tablet Take 20 mg by mouth daily. Pt takes 1/2 tablet in the morning and 1/2 tablet in the evening   promethazine-dextromethorphan (PROMETHAZINE-DM) 6.25-15 MG/5ML syrup Take 5 mLs by mouth 4 (four) times daily as needed for cough.   No  facility-administered encounter medications on file as of 03/23/2022.    Allergies (verified) Adhesive [tape] and Latex   History: Past Medical History:  Diagnosis Date   Barrett esophagus    Benign neoplasm of other and unspecified site of the digestive system 04/29/2007   Breast cancer ([place]) 10/13/2020   Chronic bronchitis (HCC)    Depression    Diverticulosis of colon (without mention of hemorrhage)    Esophageal reflux    Essential hypertension, benign    Gastritis    Grave's disease    Hiatal hernia    Hypothyroidism    IBS (irritable bowel syndrome)    Obesity    Osteopenia    Personal history of radiation therapy    Sleep  apnea    Symptomatic menopausal or female climacteric states    Past Surgical History:  Procedure Laterality Date   ABDOMINAL HYSTERECTOMY N/A    BREAST BIOPSY Right 09/22/2020   BREAST LUMPECTOMY Right 10/13/2020   BREAST LUMPECTOMY WITH RADIOACTIVE SEED LOCALIZATION Right 10/13/2020   Procedure: RIGHT BREAST LUMPECTOMY WITH RADIOACTIVE SEED LOCALIZATION;  Surgeon: Rolm Bookbinder, MD;  Location: [facility];  Service: General;  Laterality: Right;   CHOLECYSTECTOMY     INCONTINENCE SURGERY     TONSILLECTOMY     Family History  Problem Relation Age of Onset   Hypertension Mother    Arthritis Mother    Osteoporosis Mother    Kidney disease Mother        related to APAP use   Lung cancer Father    Hypertension Brother    Hypertension Daughter    Diabetes Daughter    Diabetes Son    Diabetes Other    Clotting disorder Other    Colon cancer Neg Hx    Social History   Socioeconomic History   Marital status: Married    Spouse name: Francee Piccolo   Number of children: 3   Years of education: 16   Highest education level: Master's degree (e.g., MA, MS, MEng, MEd, MSW, MBA)  Occupational History   Occupation: Hospice- Chaplain    Comment: [place]   Occupation: retired  Tobacco Use   Smoking status: Former    Packs/day: 0.50    Years: 5.00    Total pack years: 2.50    Types: Cigarettes    Quit date: 01/24/1968    Years since quitting: 54.1   Smokeless tobacco: Never  Vaping Use   Vaping Use: Never used  Substance and Sexual Activity   Alcohol use: No    Alcohol/week: 0.0 standard drinks of alcohol   Drug use: No   Sexual activity: Yes  Other Topics Concern   Not on file  Social History Narrative   Lives at home with husband    Social Determinants of Health   Financial Resource Strain: Low Risk  (03/23/2022)   Overall Financial Resource Strain (CARDIA)    Difficulty of Paying Living Expenses: Not hard at all  Food Insecurity: No Food Insecurity  (03/23/2022)   Hunger Vital Sign    Worried About Running Out of Food in the Last Year: Never true    Ran Out of Food in the Last Year: Never true  Transportation Needs: No Transportation Needs (03/23/2022)   PRAPARE - Hydrologist (Medical): No    Lack of Transportation (Non-Medical): No  Physical Activity: Insufficiently Active (03/23/2022)   Exercise Vital Sign    Days of Exercise per Week: 3 days  Minutes of Exercise per Session: 30 min  Stress: No Stress Concern Present (03/23/2022)   [place]    Feeling of Stress : Not at all  Social Connections: [place] (03/23/2022)   Social Connection and Isolation Panel [NHANES]    Frequency of Communication with Friends and Family: More than three times a week    Frequency of Social Gatherings with Friends and Family: More than three times a week    Attends Religious Services: More than 4 times per year    Active Member of [employer] or Organizations: Yes    Attends Music therapist: More than 4 times per year    Marital Status: Married    Tobacco Counseling Counseling given: Not Answered   Clinical Intake:  Pre-visit preparation completed: Yes  Pain : No/denies pain     Nutritional Risks: None Diabetes: No  How often do you need to have someone help you when you read instructions, pamphlets, or other written materials from your doctor or pharmacy?: 1 - Never  Diabetic?no   Interpreter Needed?: No  Information entered by :: Jadene Pierini, LPN   Activities of Daily Living    03/23/2022    8:14 AM  In your present state of health, do you have any difficulty performing the following activities:  Hearing? 0  Vision? 0  Difficulty concentrating or making decisions? 0  Walking or climbing stairs? 0  Dressing or bathing? 0  Doing errands, shopping? 0  Preparing Food and eating ? N  Using the Toilet? N  In the  past six months, have you accidently leaked urine? N  Do you have problems with loss of bowel control? N  Managing your Medications? N  Managing your Finances? N  Housekeeping or managing your Housekeeping? N    Patient Care Team: Sharion Balloon, FNP as PCP - General (Family Medicine) Renaldo Harrison, MD as Consulting Physician (Radiation Oncology) Nicholas Lose, MD as Consulting Physician (Hematology and Oncology) Daryel November, MD as Consulting Physician (Gastroenterology) Rigoberto Noel, MD as Consulting Physician (Pulmonary Disease) Rolm Bookbinder, MD as Consulting Physician (General Surgery)  Indicate any recent Medical Services you may have received from other than [facility] providers in the past year (date may be approximate).     Assessment:   This is a routine wellness examination for [place].  Hearing/Vision screen Vision Screening - Comments:: Wears rx glasses - up to date with routine eye exams with  Dr.Groat   Dietary issues and exercise activities discussed: Current Exercise Habits: Home exercise routine, Type of exercise: walking, Time (Minutes): 30, Frequency (Times/Week): 3, Weekly Exercise (Minutes/Week): 90, Intensity: Mild, Exercise limited by: None identified   Goals Addressed             This Visit's Progress    Exercise 3x per week (30 min per time)   On track    Increase walking to 3 times per week for 30 minutes each session       Depression Screen    03/23/2022    8:12 AM 12/30/2021   11:00 AM 08/22/2021   11:57 AM 06/13/2021   10:35 AM 12/21/2020    2:08 PM 11/16/2020    2:40 PM 09/23/2020   12:16 PM  PHQ 2/9 Scores  PHQ - 2 Score 0 0 0 0 1 1 0  PHQ- 9 Score  '2  3   3    '$ Fall Risk    03/23/2022  8:11 AM 12/30/2021   11:00 AM 08/22/2021   11:57 AM 12/21/2020    2:15 PM 11/16/2020    2:39 PM  [place] in the past year? 0 0 0 0 0  Number falls in past yr: 0   0   Injury with Fall? 0   0   Risk for fall due to : No Fall  Risks   Orthopedic patient   Follow up Falls prevention discussed   Falls prevention discussed     [place]:  Any stairs in or around the home? Yes  If so, are there any without handrails? No  Home free of loose throw rugs in walkways, pet beds, electrical cords, etc? Yes  Adequate lighting in your home to reduce risk of falls? Yes   ASSISTIVE DEVICES UTILIZED TO PREVENT FALLS:  Life alert? No  Use of a cane, walker or w/c? No  Grab bars in the bathroom? No  Shower chair or bench in shower? No  Elevated toilet seat or a handicapped toilet? No       11/01/2017   10:42 AM 08/02/2016    9:33 AM  MMSE - Mini Mental State Exam  Orientation to time 5 5  Orientation to Place 5 5  Registration 3 3  Attention/ Calculation 5 5  Recall 3 3  Language- name 2 objects 2 2  Language- repeat 1 1  Language- follow 3 step command 3 3  Language- read & follow direction 1 1  Write a sentence 1 1  Copy design 1 1  Total score 30 30        03/23/2022    8:14 AM 12/17/2019    8:44 AM 12/11/2018    8:41 AM  6CIT Screen  What Year? 0 points 0 points 0 points  What month? 0 points 0 points 0 points  What time? 0 points 0 points 0 points  Count back from 20 0 points 0 points 0 points  Months in reverse 0 points 0 points 0 points  Repeat phrase 0 points 0 points 0 points  Total Score 0 points 0 points 0 points    Immunizations Immunization History  Administered Date(s) Administered   Covid-19, Mrna,Vaccine(Spikevax)60yr and older 12/18/2021   Fluad Quad(high Dose 65+) 11/05/2018, 12/11/2019, 11/16/2020, 12/18/2021   Influenza, High Dose Seasonal PF 01/19/2016, 11/24/2016   Influenza-Unspecified 11/23/2013, 11/04/2020   MMR 06/12/1994   Moderna Covid-19 Vaccine Bivalent Booster 128yr& up 12/27/2020   Moderna Sars-Covid-2 Vaccination 03/06/2019, 04/04/2019, 11/27/2019   Pneumococcal Conjugate-13 01/19/2016   Pneumococcal Polysaccharide-23  11/01/2017   Td 06/14/1994   Td,absorbed, Preservative Free, Adult Use, Lf Unspecified 08/17/2004   Tdap 12/23/2013   Unspecified SARS-COV-2 Vaccination 10/23/2020   Zoster Recombinat (Shingrix) 09/23/2020, 05/25/2021   Zoster, Live 06/28/2015    TDAP status: Up to date  Flu Vaccine status: Up to date  Pneumococcal vaccine status: Up to date  Covid-19 vaccine status: Completed vaccines  Qualifies for Shingles Vaccine? Yes   Zostavax completed Yes   Shingrix Completed?: Yes  Screening Tests Health Maintenance  Topic Date Due   COVID-19 Vaccine (7 - 2023-24 season) 02/12/2022   DEXA SCAN  01/14/2023 (Originally 12/10/2021)   MAMMOGRAM  09/02/2022   Medicare Annual Wellness (AWV)  03/23/2023   DTaP/Tdap/Td (4 - Td or Tdap) 12/24/2023   COLONOSCOPY (Pts 45-4975yrnsurance coverage will need to be confirmed)  03/21/2024   Pneumonia Vaccine 65+68ears old  Completed  INFLUENZA VACCINE  Completed   Hepatitis C Screening  Completed   Zoster Vaccines- Shingrix  Completed   HPV VACCINES  Aged Out    Health Maintenance  Health Maintenance Due  Topic Date Due   COVID-19 Vaccine (7 - 2023-24 season) 02/12/2022    Colorectal cancer screening: Type of screening: Colonoscopy. Completed 03/21/2021. Repeat every 3 years  Mammogram status: Completed 09/01/2021. Repeat every year  Bone Density status: Ordered 03/23/2022. Pt provided with contact info and advised to call to schedule appt.  Lung Cancer Screening: (Low Dose CT Chest recommended if Age 86-80 years, 30 pack-year currently smoking OR have quit w/in 15years.) does not qualify.   Lung Cancer Screening Referral: n/a  Additional Screening:  Hepatitis C Screening: does not qualify; Completed 10/12/2016  Vision Screening: Recommended annual ophthalmology exams for early detection of glaucoma and other disorders of the eye. Is the patient up to date with their annual eye exam?  Yes  Who is the provider or what is the name  of the office in which the patient attends annual eye exams? Dr.Groat  If pt is not established with a provider, would they like to be referred to a provider to establish care? No .   Dental Screening: Recommended annual dental exams for proper oral hygiene  Community Resource Referral / Chronic Care Management: CRR required this visit?  No   CCM required this visit?  No      Plan:     I have personally reviewed and noted the following in the patient's chart:   Medical and social history Use of alcohol, tobacco or illicit drugs  Current medications and supplements including opioid prescriptions. Patient is not currently taking opioid prescriptions. Functional ability and status Nutritional status Physical activity Advanced directives List of other physicians Hospitalizations, surgeries, and ER visits in previous 12 months Vitals Screenings to include cognitive, depression, and falls Referrals and appointments  In addition, I have reviewed and discussed with patient certain preventive protocols, quality metrics, and best practice recommendations. A written personalized care plan for preventive services as well as general preventive health recommendations were provided to patient.     Daphane Shepherd, LPN   [id number]   Nurse Notes: none

## 2022-03-25 ENCOUNTER — Other Ambulatory Visit: Payer: Self-pay | Admitting: Family

## 2022-03-25 DIAGNOSIS — E785 Hyperlipidemia, unspecified: Secondary | ICD-10-CM

## 2022-03-27 ENCOUNTER — Encounter: Payer: Self-pay | Admitting: Family

## 2022-03-27 NOTE — Telephone Encounter (Signed)
LMTCB TO SCHEDULE APPT LETTER MAILED

## 2022-03-27 NOTE — Telephone Encounter (Signed)
[place] NTBS 30 days given 02/22/22

## 2022-03-29 ENCOUNTER — Other Ambulatory Visit: Payer: Self-pay | Admitting: Family

## 2022-03-29 DIAGNOSIS — E785 Hyperlipidemia, unspecified: Secondary | ICD-10-CM

## 2022-03-30 ENCOUNTER — Other Ambulatory Visit: Payer: Self-pay

## 2022-03-30 DIAGNOSIS — Z780 Asymptomatic menopausal state: Secondary | ICD-10-CM

## 2022-04-18 ENCOUNTER — Ambulatory Visit (INDEPENDENT_AMBULATORY_CARE_PROVIDER_SITE_OTHER): Payer: Medicare Other

## 2022-04-18 ENCOUNTER — Ambulatory Visit (INDEPENDENT_AMBULATORY_CARE_PROVIDER_SITE_OTHER): Payer: Medicare Other | Admitting: Family

## 2022-04-18 ENCOUNTER — Encounter: Payer: Self-pay | Admitting: Family

## 2022-04-18 ENCOUNTER — Other Ambulatory Visit: Payer: Self-pay | Admitting: Family

## 2022-04-18 VITALS — BP 126/74 | HR 58 | Temp 97.0°F | Ht 65.0 in | Wt 193.8 lb

## 2022-04-18 DIAGNOSIS — Z780 Asymptomatic menopausal state: Secondary | ICD-10-CM

## 2022-04-18 DIAGNOSIS — K219 Gastro-esophageal reflux disease without esophagitis: Secondary | ICD-10-CM

## 2022-04-18 DIAGNOSIS — M549 Dorsalgia, unspecified: Secondary | ICD-10-CM | POA: Diagnosis not present

## 2022-04-18 DIAGNOSIS — E785 Hyperlipidemia, unspecified: Secondary | ICD-10-CM | POA: Diagnosis not present

## 2022-04-18 DIAGNOSIS — M85852 Other specified disorders of bone density and structure, left thigh: Secondary | ICD-10-CM | POA: Diagnosis not present

## 2022-04-18 DIAGNOSIS — Z853 Personal history of malignant neoplasm of breast: Secondary | ICD-10-CM | POA: Diagnosis not present

## 2022-04-18 DIAGNOSIS — M5430 Sciatica, unspecified side: Secondary | ICD-10-CM | POA: Diagnosis not present

## 2022-04-18 DIAGNOSIS — I10 Essential (primary) hypertension: Secondary | ICD-10-CM | POA: Diagnosis not present

## 2022-04-18 DIAGNOSIS — G4733 Obstructive sleep apnea (adult) (pediatric): Secondary | ICD-10-CM

## 2022-04-18 DIAGNOSIS — M159 Polyosteoarthritis, unspecified: Secondary | ICD-10-CM

## 2022-04-18 DIAGNOSIS — M85851 Other specified disorders of bone density and structure, right thigh: Secondary | ICD-10-CM | POA: Diagnosis not present

## 2022-04-18 MED ORDER — ATORVASTATIN CALCIUM 10 MG PO TABS: 10.0000 mg | ORAL_TABLET | Freq: Every day | ORAL | 3 refills | Status: DC

## 2022-04-18 MED ORDER — CELECOXIB 200 MG PO CAPS: 200.0000 mg | ORAL_CAPSULE | Freq: Two times a day (BID) | ORAL | 2 refills | Status: DC

## 2022-04-18 NOTE — Patient Instructions (Signed)
Hip Bursitis  Hip bursitis is the swelling of one or more of the fluid-filled sacs (bursae) in the hip joint. The hip bursae absorb shocks and prevent bones from rubbing against each other. If a bursa becomes irritated, it can fill with extra fluid and become inflamed. Hip bursitis can cause mild to moderate pain, and symptoms often come and go over time. What are the causes? This condition results from increased friction between the hip bones and the tendons around the hip joint. This condition can happen if you: Overuse your hip muscles. Injure your hip. Have weak buttocks muscles. Have bone spurs. Have an infection. In some cases, the cause may not be known. What increases the risk? You are more likely to develop this condition if: You injured your hip previously or had hip surgery. You have a medical condition, such as arthritis, gout, diabetes, or thyroid disease. You have spine problems. You have one leg that is shorter than the other. You participate in athletic activities that include repetitive motion, like running. You participate in sports where there is a risk of injury or falling, such as football, martial arts, or skiing. What are the signs or symptoms? Symptoms may come and go, and they often include: Pain in the hip or groin area. Pain may get worse with movement. Tenderness and swelling of the hip. In rare cases, the bursa may become infected. If this happens, you may get a fever, as well as warmth and redness in the hip area. How is this diagnosed? This condition may be diagnosed based on: Your symptoms. Your medical history. A physical exam. Imaging tests, such as: X-rays to check your bones. MRI or ultrasound to check your tendons and muscles. Bone scan. How is this treated? This condition is treated by resting, icing, applying pressure (compression), and raising (elevating) the injured area. This is called RICE treatment. In some cases, RICE treatment may not  be enough to make your symptoms go away. Treatment may also include: Using crutches, a cane, or a walker to decrease the strain on your hip. Taking medicine to help with swelling and pain. Getting a shot of cortisone medicine near the affected area to reduce swelling and pain. Taking antibiotic medicines if there is an infection. Draining fluid out of the bursa to help relieve swelling and pain. Having surgery to remove a damaged or infected bursa. This is rare. Long-term treatment may include: Physical therapy exercises for strength and flexibility. Identifying the cause of your bursitis to prevent future episodes. Lifestyle changes, such as weight loss, to reduce the strain on the hip. Follow these instructions at home: Managing pain, stiffness, and swelling     If directed, put ice on the affected area. To do this: Put ice in a plastic bag. Place a towel between your skin and the bag. Leave the ice on for 20 minutes, 2-3 times a day. Remove the ice if your skin turns bright red. This is very important. If you cannot feel pain, heat, or cold, you have a greater risk of damage to the area. Elevate your hip as much as you can without feeling pain. To do this, put a pillow under your hips while you lie down. If directed, apply heat to the affected area as often as told by your health care provider. Use the heat source that your health care provider recommends, such as a moist heat pack or a heating pad. Place a towel between your skin and the heat source. Leave the heat   on for 20-30 minutes. Remove the heat if your skin turns bright red. This is especially important if you are unable to feel pain, heat, or cold. You may have a greater risk of getting burned. Activity Do not use your hip to support your body weight until your health care provider says that you can. Use crutches, a cane, or a walker as told by your health care provider. If the affected leg is one that you use to drive, ask  your health care provider if it is safe to drive. Rest and protect your hip as much as possible until your pain and swelling get better. Return to your normal activities as told by your health care provider. Ask your health care provider what activities are safe for you. Do exercises as told by your health care provider. General instructions Take over-the-counter and prescription medicines only as told by your health care provider. Gently massage and stretch your injured area as often as is comfortable. Wear compression wraps only as told by your health care provider. If one of your legs is shorter than the other, get fitted for a shoe insert or orthotic. Your health care provider or physical therapist can tell you where to find these items and what size you need. Maintain a healthy weight. Follow instructions from your health care provider for weight control. These may include dietary restrictions. Keep all follow-up visits. This is important. How is this prevented? Exercise regularly or as told by your health care provider. Wear supportive footwear that is appropriate for your sport and daily activities. Warm up and stretch before being active. Cool down and stretch after being active. Take breaks regularly from repetitive activity. If an activity irritates your hip or causes pain, avoid the activity as much as possible. Avoid sitting down for long periods at a time. Where to find more information American Academy [employer]: orthoinfo.aaos.org Contact a health care provider if: You have a fever. You develop new symptoms. You have trouble walking or doing everyday activities. You have pain that gets worse or does not get better with medicine. You develop red skin or a feeling of warmth in your hip area. Get help right away if: You cannot move your hip. You have severe pain. You cannot control the muscles in your feet. Summary Hip bursitis is the swelling of one or more  of the fluid-filled sacs (bursae) in the hip joint. Hip bursitis can cause hip or groin pain, and symptoms often come and go over time. This condition is often treated by resting, icing, applying pressure (compression), and raising (elevating) the injured area. Other treatments may be needed. This information is not intended to replace advice given to you by your health care provider. Make sure you discuss any questions you have with your health care provider. Document Revised: 01/04/2021 Document Reviewed: 01/04/2021 Elsevier Patient Education  2023 [employer]  

## 2022-04-18 NOTE — Progress Notes (Signed)
Subjective:    Patient ID: Laura James, female    DOB: 03/28/1950, 72 y.o.   MRN: [id number]  Chief Complaint  Patient presents with   Medical Management of Chronic Issues   Pt presents to the office today for CPE and  chronic follow up.  PT is followed by Ortho as needed  for back pain and bursitis of hip.    She has hx right breast cancer and followed by Oncologists annually.    She has OSA and uses CPAP nightly.   Hypertension This is a chronic problem. The current episode started more than 1 year ago. The problem has been resolved since onset. The problem is controlled. Associated symptoms include peripheral edema (some times). Pertinent negatives include no malaise/fatigue or shortness of breath. Risk factors for coronary artery disease include dyslipidemia, obesity and sedentary lifestyle. The current treatment provides moderate improvement.  Gastroesophageal Reflux She complains of belching and heartburn. This is a chronic problem. The current episode started more than 1 year ago. The problem occurs occasionally. Risk factors include obesity. She has tried a PPI for the symptoms. The treatment provided moderate relief.  Back Pain This is a chronic problem. The current episode started more than 1 year ago. The problem occurs intermittently. The pain is present in the lumbar spine. The quality of the pain is described as aching. The pain is at a severity of 8/10. The pain is mild.  Arthritis Presents for follow-up visit. Affected locations include the right hip, left hip, left knee, right knee, left elbow and right elbow. Her pain is at a severity of 8/10.  Hyperlipidemia This is a chronic problem. The current episode started more than 1 year ago. The problem is controlled. Recent lipid tests were reviewed and are normal. Pertinent negatives include no shortness of breath. Current antihyperlipidemic treatment includes statins. The current treatment provides moderate improvement of  lipids. Risk factors for coronary artery disease include dyslipidemia, hypertension, a sedentary lifestyle and post-menopausal.      Review of Systems  Constitutional:  Negative for malaise/fatigue.  Respiratory:  Negative for shortness of breath.   Gastrointestinal:  Positive for heartburn.  Musculoskeletal:  Positive for arthritis and back pain.  All other systems reviewed and are negative.      Objective:   Physical Exam Vitals reviewed.  Constitutional:      General: She is not in acute distress.    Appearance: She is well-developed. She is obese.  HENT:     Head: Normocephalic and atraumatic.     Right Ear: Tympanic membrane normal.     Left Ear: Tympanic membrane normal.  Eyes:     Pupils: Pupils are equal, round, and reactive to light.  Neck:     Thyroid: No thyromegaly.  Cardiovascular:     Rate and Rhythm: Normal rate and regular rhythm.     Heart sounds: Normal heart sounds. No murmur heard. Pulmonary:     Effort: Pulmonary effort is normal. No respiratory distress.     Breath sounds: Normal breath sounds. No wheezing.  Abdominal:     General: Bowel sounds are normal. There is no distension.     Palpations: Abdomen is soft.     Tenderness: There is no abdominal tenderness.  Musculoskeletal:        General: No tenderness. Normal range of motion.     Cervical back: Normal range of motion and neck supple.  Skin:    General: Skin is warm and dry.  Neurological:  Mental Status: She is alert and oriented to person, place, and time.     Cranial Nerves: No cranial nerve deficit.     Deep Tendon Reflexes: Reflexes are normal and symmetric.  Psychiatric:        Behavior: Behavior normal.        Thought Content: Thought content normal.        Judgment: Judgment normal.       BP 126/74   Pulse (!) 58   Temp (!) 97 F (36.1 C) (Temporal)   Ht 5\' 5"  (1.651 m)   Wt 193 lb 12.8 oz (87.9 kg)   SpO2 97%   BMI 32.25 kg/m      Assessment & Plan:  Laura Kolin  James comes in today with chief complaint of Medical Management of Chronic Issues   Diagnosis and orders addressed:  1. Hyperlipidemia, unspecified hyperlipidemia type - atorvastatin (LIPITOR) 10 MG tablet; Take 1 tablet (10 mg total) by mouth daily.  Dispense: 90 tablet; Refill: 3 - CMP14+EGFR  2. Gastroesophageal reflux disease without esophagitis - CMP14+EGFR  3. Essential hypertension, benign - CMP14+EGFR  4. Obstructive sleep apnea - CMP14+EGFR  5. Primary osteoarthritis involving multiple joints - CMP14+EGFR - celecoxib (CELEBREX) 200 MG capsule; Take 1 capsule (200 mg total) by mouth 2 (two) times daily.  Dispense: 60 capsule; Refill: 2  6. Back pain with sciatica -Start Celebrex 200 mg BID No other NSAID's  Take with food - CMP14+EGFR - celecoxib (CELEBREX) 200 MG capsule; Take 1 capsule (200 mg total) by mouth 2 (two) times daily.  Dispense: 60 capsule; Refill: 2  7. History of right breast cancer - CMP14+EGFR  8. Post-menopause - CMP14+EGFR - DG [facility] DEXA   Labs pending Health Maintenance reviewed Diet and exercise encouraged  Follow up plan: 6 months    Evelina Dun, FNP

## 2022-04-19 LAB — CMP14+EGFR
ALT: 22 IU/L (ref 0–32)
AST: 21 IU/L (ref 0–40)
Albumin/Globulin Ratio: 1.5 (ref 1.2–2.2)
Albumin: 3.9 g/dL (ref 3.8–4.8)
Alkaline Phosphatase: 84 IU/L (ref 44–121)
BUN/Creatinine Ratio: 16 (ref 12–28)
BUN: 18 mg/dL (ref 8–27)
Bilirubin Total: 0.3 mg/dL (ref 0.0–1.2)
CO2: 23 mmol/L (ref 20–29)
Calcium: 9.8 mg/dL (ref 8.7–10.3)
Chloride: 101 mmol/L (ref 96–106)
Creatinine, Ser: 1.1 mg/dL — ABNORMAL HIGH (ref 0.57–1.00)
Globulin, Total: 2.6 g/dL (ref 1.5–4.5)
Glucose: 101 mg/dL — ABNORMAL HIGH (ref 70–99)
Potassium: 3.7 mmol/L (ref 3.5–5.2)
Sodium: 140 mmol/L (ref 134–144)
Total Protein: 6.5 g/dL (ref 6.0–8.5)
eGFR: 53 mL/min/{1.73_m2} — ABNORMAL LOW (ref >59)

## 2022-05-11 ENCOUNTER — Telehealth: Payer: Self-pay | Admitting: Hematology and Oncology

## 2022-05-11 NOTE — Telephone Encounter (Signed)
Rescheduled appointment per provider PAL. Patient is aware of the changes made to her upcoming appointments. 

## 2022-05-18 ENCOUNTER — Inpatient Hospital Stay: Payer: Medicare Other | Admitting: Hematology and Oncology

## 2022-05-18 DIAGNOSIS — D0511 Intraductal carcinoma in situ of right breast: Secondary | ICD-10-CM | POA: Diagnosis not present

## 2022-06-07 NOTE — Progress Notes (Signed)
Patient Care Team: Junie Spencer, FNP as PCP - General (Family Medicine) Nils Pyle, MD as Consulting Physician (Radiation Oncology) Serena Croissant, MD as Consulting Physician (Hematology and Oncology) Jenel Lucks, MD as Consulting Physician (Gastroenterology) Oretha Milch, MD as Consulting Physician (Pulmonary Disease) Emelia Loron, MD as Consulting Physician (General Surgery)  DIAGNOSIS: No diagnosis found.  SUMMARY OF ONCOLOGIC HISTORY: Oncology History  Ductal carcinoma in situ (DCIS) of right breast (Resolved)  09/13/2020 Initial Diagnosis   Screening mammogram: calcifications in the right breast. Diagnostic mammogram and [place]: 1 cm group of indeterminate calcifications within the upper outer right breast.    10/13/2020 Surgery   Right lumpectomy: intermediate grade DCIS 0.6 cm with margins uninvolved by carcinoma.  ER 95%, PR 90%   10/13/2020 Cancer Staging   Staging form: Breast, AJCC 8th Edition - Pathologic stage from 10/13/2020: Stage 0 (pTis (DCIS), pN0, cM0) - Signed by Loa Socks, NP on 04/26/2021 Stage prefix: Initial diagnosis   11/29/2020 - 12/22/2020 Radiation Therapy   Site: Right Breast Technique: 3D CRT Goal: Curative Whole Breast 4272 cGy in 267 cGy daily fractions (16 Fractions)   01/27/2021 -  Anti-estrogen oral therapy   Tamoxifen     CHIEF COMPLIANT: Follow-up DCIS on tamoxifen   INTERVAL HISTORY: Laura James is a 72 y.o. female is here because of recent diagnosis of DCIS of the right breast. She presents to the clinic for a follow-up.    ALLERGIES:  is allergic to adhesive [tape] and latex.  MEDICATIONS:  Current Outpatient Medications  Medication Sig Dispense Refill   albuterol (PROAIR HFA) 108 (90 Base) MCG/ACT inhaler INHALE 2 PUFFS BY MOUTH EVERY 6 HOURS AS NEEDED FOR WHEEZE OR SHORTNESS OF BREATH 8.5 each 2   aspirin 81 MG tablet Take 81 mg by mouth daily.     atenolol (TENORMIN) 25 MG tablet Take 1 tablet  (25 mg total) by mouth daily. (Needs to be seen before next refill) 90 tablet 3   atorvastatin (LIPITOR) 10 MG tablet Take 1 tablet (10 mg total) by mouth daily. 90 tablet 3   celecoxib (CELEBREX) 200 MG capsule Take 1 capsule (200 mg total) by mouth 2 (two) times daily. 60 capsule 2   cetirizine (ZYRTEC) 10 MG tablet Take 10 mg by mouth daily as needed.      cholecalciferol (VITAMIN D) 1000 units tablet Take 1 tablet (1,000 Units total) by mouth daily.     Cyanocobalamin (B-12) 3000 MCG CAPS Take 1 capsule by mouth daily.     dicyclomine (BENTYL) 20 MG tablet Take 1 tablet (20 mg total) by mouth every 6 (six) hours. 30 tablet 3   docusate sodium (COLACE) 100 MG capsule Take 200 mg by mouth 2 (two) times daily.     fluticasone (FLONASE) 50 MCG/ACT nasal spray SPRAY 2 SPRAYS INTO EACH NOSTRIL EVERY DAY 48 mL 1   hydrochlorothiazide (HYDRODIURIL) 25 MG tablet Take 1 tablet (25 mg total) by mouth daily. 90 tablet 3   magnesium oxide (MAG-OX) 400 MG tablet Take 400 mg by mouth daily.     methocarbamol (ROBAXIN) 500 MG tablet Take 1 tablet by mouth 4 (four) times daily.     omeprazole (PRILOSEC OTC) 20 MG tablet Take 20 mg by mouth daily. Pt takes 1/2 tablet in the morning and 1/2 tablet in the evening     No current facility-administered medications for this visit.    PHYSICAL EXAMINATION: ECOG PERFORMANCE STATUS: {CHL ONC ECOG JX:[id number]}  There were no vitals filed for this visit. There were no vitals filed for this visit.  BREAST:*** No palpable masses or nodules in either right or left breasts. No palpable axillary supraclavicular or infraclavicular adenopathy no breast tenderness or nipple discharge. (exam performed in the presence of a chaperone)  LABORATORY DATA:  I have reviewed the data as listed    Latest Ref Rng & Units 04/18/2022    9:11 AM 08/22/2021   12:44 PM 12/23/2020   12:41 PM  CMP  Glucose 70 - 99 mg/dL 161  096  78   BUN 8 - 27 mg/dL 18  13  13    Creatinine 0.57 -  1.00 mg/dL 0.45  4.09  8.11   Sodium 134 - 144 mmol/L 140  142  140   Potassium 3.5 - 5.2 mmol/L 3.7  3.9  4.0   Chloride 96 - 106 mmol/L 101  105  102   CO2 20 - 29 mmol/L 23  24  26    Calcium 8.7 - 10.3 mg/dL 9.8  9.3  9.6   Total Protein 6.0 - 8.5 g/dL 6.5  6.3  6.3   Total Bilirubin 0.0 - 1.2 mg/dL 0.3  <9.1  0.2   Alkaline Phos 44 - 121 IU/L 84  63  88   AST 0 - 40 IU/L 21  22  21    ALT 0 - 32 IU/L 22  16  17      Lab Results  Component Value Date   WBC 5.9 08/22/2021   HGB 13.4 08/22/2021   HCT 41.3 08/22/2021   MCV 90 08/22/2021   PLT 236 08/22/2021   NEUTROABS 3.5 08/22/2021    ASSESSMENT & PLAN:  No problem-specific Assessment & Plan notes found for this encounter.    No orders of the defined types were placed in this encounter.  The patient has a good understanding of the overall plan. she agrees with it. she will call with any problems that may develop before the next visit here. Total time spent: 30 mins including face to face time and time spent for planning, charting and co-ordination of care   Sherlyn Lick, CMA 06/07/22    I Janan Ridge am acting as a Neurosurgeon for [employer]  ***

## 2022-06-08 ENCOUNTER — Inpatient Hospital Stay: Payer: Medicare Other | Attending: Hematology and Oncology | Admitting: Hematology and Oncology

## 2022-06-08 ENCOUNTER — Other Ambulatory Visit: Payer: Self-pay

## 2022-06-08 VITALS — BP 133/71 | HR 59 | Temp 98.8°F | Wt 196.4 lb

## 2022-06-08 DIAGNOSIS — D0512 Intraductal carcinoma in situ of left breast: Secondary | ICD-10-CM | POA: Insufficient documentation

## 2022-06-08 DIAGNOSIS — Z79899 Other long term (current) drug therapy: Secondary | ICD-10-CM | POA: Diagnosis not present

## 2022-06-08 DIAGNOSIS — D0511 Intraductal carcinoma in situ of right breast: Secondary | ICD-10-CM | POA: Diagnosis not present

## 2022-06-08 DIAGNOSIS — Z923 Personal history of irradiation: Secondary | ICD-10-CM | POA: Diagnosis not present

## 2022-06-08 DIAGNOSIS — Z79810 Long term (current) use of selective estrogen receptor modulators (SERMs): Secondary | ICD-10-CM | POA: Diagnosis not present

## 2022-06-08 DIAGNOSIS — Z791 Long term (current) use of non-steroidal anti-inflammatories (NSAID): Secondary | ICD-10-CM | POA: Diagnosis not present

## 2022-06-08 DIAGNOSIS — Z7982 Long term (current) use of aspirin: Secondary | ICD-10-CM | POA: Insufficient documentation

## 2022-06-08 DIAGNOSIS — Z170 Estrogen receptor positive status [ER+]: Secondary | ICD-10-CM | POA: Insufficient documentation

## 2022-06-08 NOTE — Assessment & Plan Note (Signed)
10/13/2020:Right lumpectomy: intermediate grade DCIS 0.6 cm with margins uninvolved by carcinoma.  ER 95%, PR 90%    Recommendation: 1. Followed by adjuvant radiation therapy (to be done at [facility]) completed 12/21/20 2. Followed by antiestrogen therapy with tamoxifen 5 years to be started 01/27/21   Treatment plan: Tamoxifen started 01/27/21 10 mg daily   Tamoxifen toxicities: 1.  Vaginal discharge: This is annoying and burning and irritating her.  She has to continuously clean up otherwise it gets much worse.   I recommended that she stop tamoxifen until the symptoms resolved. After that point she can choose to take 5 mg of tamoxifen daily.  If her symptoms recur then she will stop it permanently.   Breast cancer surveillance: 1.  Breast exam 06/08/2022: Benign 2. Mammogram 09/01/21: Benign Density Cat B  Bone density 04/18/2022: T-score -2.1 osteopenia   RTC in 1 year

## 2022-06-16 ENCOUNTER — Telehealth (INDEPENDENT_AMBULATORY_CARE_PROVIDER_SITE_OTHER): Payer: Medicare Other | Admitting: Family

## 2022-06-16 ENCOUNTER — Encounter: Payer: Self-pay | Admitting: Family

## 2022-06-16 DIAGNOSIS — M5430 Sciatica, unspecified side: Secondary | ICD-10-CM

## 2022-06-16 DIAGNOSIS — M549 Dorsalgia, unspecified: Secondary | ICD-10-CM

## 2022-06-16 DIAGNOSIS — M2560 Stiffness of unspecified joint, not elsewhere classified: Secondary | ICD-10-CM | POA: Diagnosis not present

## 2022-06-16 DIAGNOSIS — M2550 Pain in unspecified joint: Secondary | ICD-10-CM | POA: Diagnosis not present

## 2022-06-16 DIAGNOSIS — M159 Polyosteoarthritis, unspecified: Secondary | ICD-10-CM | POA: Diagnosis not present

## 2022-06-16 MED ORDER — METHOCARBAMOL 500 MG PO TABS
500.0000 mg | ORAL_TABLET | Freq: Four times a day (QID) | ORAL | 1 refills | Status: DC | PRN
Start: 2022-06-16 — End: 2023-10-23

## 2022-06-16 MED ORDER — CELECOXIB 200 MG PO CAPS: 200.0000 mg | ORAL_CAPSULE | Freq: Two times a day (BID) | ORAL | 2 refills | Status: DC

## 2022-06-16 NOTE — Progress Notes (Signed)
Virtual Visit Consent   Emika Cavil Chapel, you are scheduled for a virtual visit with a [facility] provider today. Just as with appointments in the office, your consent must be obtained to participate. Your consent will be active for this visit and any virtual visit you may have with one of our providers in the next 365 days. If you have a MyChart account, a copy of this consent can be sent to you electronically.  As this is a virtual visit, video technology does not allow for your provider to perform a traditional examination. This may limit your provider's ability to fully assess your condition. If your provider identifies any concerns that need to be evaluated in person or the need to arrange testing (such as labs, EKG, etc.), we will make arrangements to do so. Although advances in technology are sophisticated, we cannot ensure that it will always work on either your end or our end. If the connection with a video visit is poor, the visit may have to be switched to a telephone visit. With either a video or telephone visit, we are not always able to ensure that we have a secure connection.  By engaging in this virtual visit, you consent to the provision of healthcare and authorize for your insurance to be billed (if applicable) for the services provided during this visit. Depending on your insurance coverage, you may receive a charge related to this service.  I need to obtain your verbal consent now. Are you willing to proceed with your visit today? Kaory Aston Loeper has provided verbal consent on 06/16/2022 for a virtual visit (video or telephone). Jannifer Rodney, FNP  Date: 06/16/2022 11:48 AM  Virtual Visit via Video Note   I, Jannifer Rodney, connected with  MIYOSHI KOIS  ([id number], July 02, 1950) on 06/16/22 at 11:10 AM EDT by a video-enabled telemedicine application and verified that I am speaking with the correct person using two identifiers.  Location: Patient: Virtual Visit Location Patient:  Home Provider: Virtual Visit Location Provider: Home Office   I discussed the limitations of evaluation and management by telemedicine and the availability of in person appointments. The patient expressed understanding and agreed to proceed.    History of Present Illness: VANASSA SCHLINK is a 72 y.o. who identifies as a female who was assigned female at birth, and is being seen today for generalized joint pain for the last 8 months.  HPI: Arthritis Presents for initial visit. Affected locations include the left knee, right knee, right wrist, left wrist, right elbow, left elbow, right hip and left hip. Her pain is at a severity of 9/10. Past treatments include rest. The treatment provided no relief.    Problems:  Patient Active Problem List   Diagnosis Date Noted   History of right breast cancer 04/18/2022   Back pain with sciatica 11/16/2020   Ductal carcinoma in situ (DCIS) of right breast 10/26/2020   Hyperlipidemia 08/08/2018   Osteoarthritis 10/12/2016   Osteopenia 09/06/2016   Upper airway cough syndrome 12/12/2014   Obstructive sleep apnea 12/23/2013   Essential hypertension, benign    Hiatal hernia    Diverticulosis of colon (without mention of hemorrhage) 12/30/2010   Gastritis, chronic 12/30/2010   IBS (irritable bowel syndrome) 08/25/2010   GERD (gastroesophageal reflux disease) 08/25/2010    Allergies:  Allergies  Allergen Reactions   Adhesive [Tape] Itching and Rash   Latex Rash   Medications:  Current Outpatient Medications:    albuterol (PROAIR HFA) 108 (90 Base) MCG/ACT  inhaler, INHALE 2 PUFFS BY MOUTH EVERY 6 HOURS AS NEEDED FOR WHEEZE OR SHORTNESS OF BREATH, Disp: 8.5 each, Rfl: 2   aspirin 81 MG tablet, Take 81 mg by mouth daily., Disp: , Rfl:    atenolol (TENORMIN) 25 MG tablet, Take 1 tablet (25 mg total) by mouth daily. (Needs to be seen before next refill), Disp: 90 tablet, Rfl: 3   atorvastatin (LIPITOR) 10 MG tablet, Take 1 tablet (10 mg total) by mouth  daily., Disp: 90 tablet, Rfl: 3   celecoxib (CELEBREX) 200 MG capsule, Take 1 capsule (200 mg total) by mouth 2 (two) times daily., Disp: 60 capsule, Rfl: 2   cetirizine (ZYRTEC) 10 MG tablet, Take 10 mg by mouth daily as needed. , Disp: , Rfl:    cholecalciferol (VITAMIN D) 1000 units tablet, Take 1 tablet (1,000 Units total) by mouth daily., Disp: , Rfl:    Cyanocobalamin (B-12) 3000 MCG CAPS, Take 1 capsule by mouth daily., Disp: , Rfl:    dicyclomine (BENTYL) 20 MG tablet, Take 1 tablet (20 mg total) by mouth every 6 (six) hours., Disp: 30 tablet, Rfl: 3   docusate sodium (COLACE) 100 MG capsule, Take 200 mg by mouth 2 (two) times daily., Disp: , Rfl:    ferrous sulfate 325 (65 FE) MG tablet, TAKE 1 TABLET BY MOUTH 2 TIMES DAILY WITH MEAL, Disp: , Rfl:    fluticasone (FLONASE) 50 MCG/ACT nasal spray, SPRAY 2 SPRAYS INTO EACH NOSTRIL EVERY DAY, Disp: 48 mL, Rfl: 1   hydrochlorothiazide (HYDRODIURIL) 25 MG tablet, Take 1 tablet (25 mg total) by mouth daily., Disp: 90 tablet, Rfl: 3   magnesium oxide (MAG-OX) 400 MG tablet, Take 400 mg by mouth daily., Disp: , Rfl:    methocarbamol (ROBAXIN) 500 MG tablet, Take 1 tablet (500 mg total) by mouth every 6 (six) hours as needed for muscle spasms., Disp: 90 tablet, Rfl: 1   omeprazole (PRILOSEC OTC) 20 MG tablet, Take 20 mg by mouth daily. Pt takes 1/2 tablet in the morning and 1/2 tablet in the evening, Disp: , Rfl:   Observations/Objective: Patient is well-developed, well-nourished in no acute distress.  Resting comfortably at home.  Head is normocephalic, atraumatic.  No labored breathing. Speech is clear and coherent with logical content.  Patient is alert and oriented at baseline.    Assessment and Plan: 1. Generalized joint pain - Arthritis Panel; Future  2. Primary osteoarthritis involving multiple joints - celecoxib (CELEBREX) 200 MG capsule; Take 1 capsule (200 mg total) by mouth 2 (two) times daily.  Dispense: 60 capsule; Refill:  2 - methocarbamol (ROBAXIN) 500 MG tablet; Take 1 tablet (500 mg total) by mouth every 6 (six) hours as needed for muscle spasms.  Dispense: 90 tablet; Refill: 1  3. Multiple stiff joints - methocarbamol (ROBAXIN) 500 MG tablet; Take 1 tablet (500 mg total) by mouth every 6 (six) hours as needed for muscle spasms.  Dispense: 90 tablet; Refill: 1  4. Back pain with sciatica - celecoxib (CELEBREX) 200 MG capsule; Take 1 capsule (200 mg total) by mouth 2 (two) times daily.  Dispense: 60 capsule; Refill: 2  Given pain, will draw RA level Increase celebrex to BID  No other NSAID's  If RA negative will send to Ortho  Follow Up Instructions: I discussed the assessment and treatment plan with the patient. The patient was provided an opportunity to ask questions and all were answered. The patient agreed with the plan and demonstrated an understanding of the instructions.  A copy of instructions were sent to the patient via MyChart unless otherwise noted below.     The patient was advised to call back or seek an in-person evaluation if the symptoms worsen or if the condition fails to improve as anticipated.  Time:  I spent 16 minutes with the patient via telehealth technology discussing the above problems/concerns.    Jannifer Rodney, FNP

## 2022-06-16 NOTE — Patient Instructions (Signed)
Joint Pain Joint pain may be caused by many things. Joint pain is likely to go away when you follow instructions from your health care provider for relieving pain at home. However, joint pain can also be caused by conditions that require more treatment. Common causes of joint pain include: Bruising in the area of the joint. Injury caused by repeating certain movements too many times. Age-related joint wear and tear. Buildup of uric acid crystals in the joint (gout). Inflammation of the joint. Other forms of arthritis. Infections of the joint or of the bone. Your health care provider may recommend that you take pain medicine or wear a supportive device like an elastic bandage, sling, or splint. If your joint pain continues, you may need lab or imaging tests to diagnose the cause of your joint pain. Follow these instructions at home: Managing pain, stiffness, and swelling     If directed, put ice on the painful area. To do this: If you have a removable elastic bandage, sling, or splint, take it off as told by your doctor. Put ice in a plastic bag. Place a towel between your skin and the bag. Leave the ice on for 20 minutes, 2-3 times a day. Remove the ice if your skin turns bright red. This is very important. If you cannot feel pain, heat, or cold, you have a greater risk of damage to the area. Move your fingers and toes often to reduce stiffness and swelling. Raise the injured area above the level of your heart while you are sitting or lying down. If directed, apply heat to the painful area as often as told by your health care provider. Use the heat source that your health care provider recommends, such as a moist heat pack or a heating pad. Place a towel between your skin and the heat source. Leave the heat on for 20-30 minutes. Remove the heat if your skin turns bright red. This is especially important if you are unable to feel pain, heat, or cold. You have a greater risk of getting  burned.  Activity Rest as told by your health care provider. Do not do anything that causes or worsens pain. Begin exercising or stretching the affected area as told by your health care provider. Return to your normal activities as told by your health care provider. Ask your health care provider what activities are safe for you. If you have an elastic bandage, sling, or splint: Wear the bandage, sling, or splint as told by your health care provider. Remove it only as told by your health care provider. Loosen it if your fingers or toes below the joint tingle, become numb, or turn cold and blue. Keep it clean. Ask your health care provider if you should remove it before bathing. If the bandage, sling, or splint is not waterproof: Do not let it get wet. Cover it with a watertight covering when you take a bath or shower. General instructions Treatment may include medicines for pain and inflammation that are taken by mouth or applied to the skin. Take over-the-counter and prescription medicines only as told by your health care provider. Do not use any products that contain nicotine or tobacco, such as cigarettes, e-cigarettes, and chewing tobacco. If you need help quitting, ask your health care provider. Keep all follow-up visits. This is important. Contact a health care provider if: You have pain that gets worse and does not get better with medicine. Your joint pain does not improve within 3 days. You have increased   bruising or swelling. You have a fever. You lose 10 lb (4.5 kg) or more without trying. Get help right away if: You cannot move the joint. Your fingers or toes tingle, become numb, or turn cold and blue. You have a fever along with a joint that is red, warm, and swollen. Summary Joint pain may be caused by many things. Your health care provider may recommend that you take pain medicine or wear a supportive device such as an elastic bandage, sling, or splint. If your joint pain  continues, you may need tests to diagnose the cause of your joint pain. Take over-the-counter and prescription medicines only as told by your health care provider. This information is not intended to replace advice given to you by your health care provider. Make sure you discuss any questions you have with your health care provider. Document Revised: 04/22/2019 Document Reviewed: 04/23/2019 Elsevier Patient Education  2024 [employer]  

## 2022-06-20 ENCOUNTER — Other Ambulatory Visit: Payer: Medicare Other

## 2022-06-20 DIAGNOSIS — M2550 Pain in unspecified joint: Secondary | ICD-10-CM

## 2022-06-20 LAB — ARTHRITIS PANEL
Hemoglobin: 13.5 g/dL (ref 11.1–15.9)
Lymphs: 28 %
MCH: 28 pg (ref 26.6–33.0)
Neutrophils Absolute: 3.5 10*3/uL (ref 1.4–7.0)
Neutrophils: 58 %
RDW: 13.2 % (ref 11.7–15.4)

## 2022-06-21 LAB — ARTHRITIS PANEL
Basophils Absolute: 0 10*3/uL (ref 0.0–0.2)
Basos: 0 %
EOS (ABSOLUTE): 0.4 10*3/uL (ref 0.0–0.4)
Eos: 7 %
Hematocrit: 42.4 % (ref 34.0–46.6)
Immature Grans (Abs): 0 10*3/uL (ref 0.0–0.1)
Immature Granulocytes: 0 %
Lymphocytes Absolute: 1.7 10*3/uL (ref 0.7–3.1)
MCHC: 31.8 g/dL (ref 31.5–35.7)
MCV: 88 fL (ref 79–97)
Monocytes Absolute: 0.4 10*3/uL (ref 0.1–0.9)
Monocytes: 7 %
Platelets: 281 10*3/uL (ref 150–450)
RBC: 4.83 x10E6/uL (ref 3.77–5.28)
Rheumatoid fact SerPl-aCnc: 10 IU/mL (ref <14.0)
Sed Rate: 8 mm/hr (ref 0–40)
Uric Acid: 3.7 mg/dL (ref 3.1–7.9)
WBC: 6.1 10*3/uL (ref 3.4–10.8)

## 2022-06-22 ENCOUNTER — Other Ambulatory Visit: Payer: Self-pay | Admitting: Family

## 2022-06-22 DIAGNOSIS — M5430 Sciatica, unspecified side: Secondary | ICD-10-CM

## 2022-06-22 DIAGNOSIS — M159 Polyosteoarthritis, unspecified: Secondary | ICD-10-CM

## 2022-07-13 ENCOUNTER — Ambulatory Visit (INDEPENDENT_AMBULATORY_CARE_PROVIDER_SITE_OTHER): Payer: Medicare Other | Admitting: Orthopaedic Surgery

## 2022-07-13 ENCOUNTER — Encounter: Payer: Self-pay | Admitting: Orthopaedic Surgery

## 2022-07-13 ENCOUNTER — Other Ambulatory Visit (INDEPENDENT_AMBULATORY_CARE_PROVIDER_SITE_OTHER): Payer: Medicare Other

## 2022-07-13 VITALS — Ht 65.0 in | Wt 190.0 lb

## 2022-07-13 DIAGNOSIS — M25551 Pain in right hip: Secondary | ICD-10-CM

## 2022-07-13 DIAGNOSIS — G8929 Other chronic pain: Secondary | ICD-10-CM | POA: Diagnosis not present

## 2022-07-13 DIAGNOSIS — M25552 Pain in left hip: Secondary | ICD-10-CM

## 2022-07-13 DIAGNOSIS — M545 Low back pain, unspecified: Secondary | ICD-10-CM | POA: Diagnosis not present

## 2022-07-13 NOTE — Progress Notes (Signed)
Office Visit Note   Patient: Laura James           Date of Birth: 27-Apr-1950           MRN: [id number] Visit Date: 07/13/2022              Requested by: Junie Spencer, FNP [address] [place],  [place] [address] PCP: Junie Spencer, FNP   Assessment & Plan: Visit Diagnoses:  1. Chronic bilateral low back pain, unspecified whether sciatica present   2. Bilateral hip pain     Plan: Patient tolerated the right trochanteric injection well she can return couple weeks if she would like to have the left greater trochanteric bursa injected.  Follow-Up Instructions: No follow-ups on file.   Orders:  Orders Placed This Encounter  Procedures   XR Lumbar Spine 2-3 Views   XR HIPS BILAT W OR W/O PELVIS 3-4 VIEWS   No orders of the defined types were placed in this encounter.     Procedures: No procedures performed   Clinical Data: No additional findings.   Subjective: Chief Complaint  Patient presents with   Lower Back - Pain   Right Hip - Pain   Left Hip - Pain    HPI 72 year old female normally follows Jannifer Rodney FNP here with low back pain chronic and bilateral hip pain.  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  She states she has had injections in her hip +2 epidurals with [facility] without relief.  She thinks these right L4 and L5.  Pain radiates from her back in the posterior thighs down to her knees occasionally in the groin.  She denies significant limping problems.  No associated bowel or bladder symptoms.  No previous lumbar surgeries.  She has had breast cancer right breast and completed treatment 2022 with surgery and then radiation.  Review of Systems positive for hypertension ,diverticulosis, breast cancer, chronic low back pain.   Objective: Vital Signs: Ht 5\' 5"  (1.651 m)   Wt 190 lb (86.2 kg)   BMI 31.62 kg/m   Physical Exam Constitutional:      Appearance: She is well-developed.  HENT:     Head: Normocephalic.      Right Ear: External ear normal.     Left Ear: External ear normal. There is no impacted cerumen.  Eyes:     Pupils: Pupils are equal, round, and reactive to light.  Neck:     Thyroid: No thyromegaly.     Trachea: No tracheal deviation.  Cardiovascular:     Rate and Rhythm: Normal rate.  Pulmonary:     Effort: Pulmonary effort is normal.  Abdominal:     Palpations: Abdomen is soft.  Musculoskeletal:     Cervical back: No rigidity.  Skin:    General: Skin is warm and dry.  Neurological:     Mental Status: She is alert and oriented to person, place, and time.  Psychiatric:        Behavior: Behavior normal.     Ortho Exam patient has bilateral trochanteric tenderness slightly worse right than left minimal sciatic notch tenderness negative straight leg raising 90 degrees negative up to compression test knee and ankle jerk are intact negative logroll hips.  No lower extremity atrophy.  Skin over the lumbar spine is normal tenderness lumbosacral junction.  Negative Trendelenburg gait.  No pain with internal rotation right or left hip.  Specialty Comments:  No specialty comments available.  Imaging: XR HIPS BILAT W OR W/O PELVIS 3-4 VIEWS  Result Date: 07/13/2022 AP AP pelvis and x-rays hips with frog-leg obtained and reviewed.  No significant hip osteoarthritis.  Bone anatomy is normal femoral necks are intact. Impression: Negative for right left hip radiographs.  XR Lumbar Spine 2-3 Views  Result Date: 07/13/2022 AP lateral lumbar images are obtained and reviewed normal lumbar curvature.  With 2 to 3 mm anterolisthesis grade 1 at L4-5 with degenerative facet changes.  Negative for acute bone changes. Impression: Slight L4-5 facet degeneration with anterolisthesis.    PMFS History: Patient Active Problem List   Diagnosis Date Noted   History of right breast cancer 04/18/2022   Back pain with sciatica 11/16/2020   Ductal carcinoma in situ (DCIS) of right breast 10/26/2020    Hyperlipidemia 08/08/2018   Osteoarthritis 10/12/2016   Osteopenia 09/06/2016   Upper airway cough syndrome 12/12/2014   Obstructive sleep apnea 12/23/2013   Essential hypertension, benign    Hiatal hernia    Diverticulosis of colon (without mention of hemorrhage) 12/30/2010   Gastritis, chronic 12/30/2010   IBS (irritable bowel syndrome) 08/25/2010   GERD (gastroesophageal reflux disease) 08/25/2010   Past Medical History:  Diagnosis Date   Barrett esophagus    Benign neoplasm of other and unspecified site of the digestive system 04/29/2007   Breast cancer (HCC) 10/13/2020   Chronic bronchitis (HCC)    Depression    Diverticulosis of colon (without mention of hemorrhage)    Esophageal reflux    Essential hypertension, benign    Gastritis    Grave's disease    Hiatal hernia    Hypothyroidism    IBS (irritable bowel syndrome)    Obesity    Osteopenia    Personal history of radiation therapy    Sleep apnea    Symptomatic menopausal or female climacteric states     Family History  Problem Relation Age of Onset   Hypertension Mother    Arthritis Mother    Osteoporosis Mother    Kidney disease Mother        related to APAP use   Lung cancer Father    Hypertension Brother    Hypertension Daughter    Diabetes Daughter    Diabetes Son    Diabetes Other    Clotting disorder Other    Colon cancer Neg Hx     Past Surgical History:  Procedure Laterality Date   ABDOMINAL HYSTERECTOMY N/A    BREAST BIOPSY Right 09/22/2020   BREAST LUMPECTOMY Right 10/13/2020   BREAST LUMPECTOMY WITH RADIOACTIVE SEED LOCALIZATION Right 10/13/2020   Procedure: RIGHT BREAST LUMPECTOMY WITH RADIOACTIVE SEED LOCALIZATION;  Surgeon: Emelia Loron, MD;  Location:  [facility];  Service: General;  Laterality: Right;   CHOLECYSTECTOMY     INCONTINENCE SURGERY     TONSILLECTOMY     Social History   Occupational History   Occupation: Hospice- Chaplain    Comment: [place]   Occupation: retired  Tobacco Use   Smoking status: Former    Packs/day: 0.50    Years: 5.00    Additional pack years: 0.00    Total pack years: 2.50    Types: Cigarettes    Quit date: 01/24/1968    Years since quitting: 54.5   Smokeless tobacco: Never  Vaping Use   Vaping Use: Never used  Substance and Sexual Activity   Alcohol use: No    Alcohol/week: 0.0 standard drinks of alcohol   Drug use: No  Sexual activity: Yes

## 2022-08-03 ENCOUNTER — Ambulatory Visit (INDEPENDENT_AMBULATORY_CARE_PROVIDER_SITE_OTHER): Payer: Medicare Other | Admitting: Orthopaedic Surgery

## 2022-08-03 DIAGNOSIS — M7062 Trochanteric bursitis, left hip: Secondary | ICD-10-CM

## 2022-08-03 MED ORDER — LIDOCAINE HCL 1 % IJ SOLN
1.0000 mL | INTRAMUSCULAR | Status: AC | PRN
Start: 2022-08-03 — End: 2022-08-03
  Administered 2022-08-03: 1 mL

## 2022-08-03 MED ORDER — METHYLPREDNISOLONE ACETATE 40 MG/ML IJ SUSP
40.0000 mg | INTRAMUSCULAR | Status: AC | PRN
Start: 2022-08-03 — End: 2022-08-03
  Administered 2022-08-03: 40 mg via INTRA_ARTICULAR

## 2022-08-03 MED ORDER — BUPIVACAINE HCL 0.25 % IJ SOLN
2.0000 mL | INTRAMUSCULAR | Status: AC | PRN
Start: 2022-08-03 — End: 2022-08-03
  Administered 2022-08-03: 2 mL via INTRA_ARTICULAR

## 2022-08-03 NOTE — Progress Notes (Signed)
Office Visit Note   Patient: Laura James           Date of Birth: March 06, 1950           MRN: [id number] Visit Date: 08/03/2022              Requested by: Junie Spencer, FNP [address] [place],  [place] [address] PCP: Junie Spencer, FNP   Assessment & Plan: Visit Diagnoses:  1. Trochanteric bursitis, left hip     Plan: left troch bursa injection done.  She gets 70% injection relief on the right hip hopefully left left similar results.  Follow-Up Instructions: No follow-ups on file.   Orders:  Orders Placed This Encounter  Procedures   Large Joint Inj   No orders of the defined types were placed in this encounter.     Procedures: Large Joint Inj: L greater trochanter on 08/03/2022 2:35 PM Details: 22 G 1.5 in needle, lateral approach Medications: 1 mL lidocaine 1 %; 2 mL bupivacaine 0.25 %; 40 mg methylPREDNISolone acetate 40 MG/ML      Clinical Data: No additional findings.   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  Previous right trochanteric bursa injection 07/13/2022 with significant improvement on the right side.  She is now here for an injection on the left.  Patient normally followed by Jannifer Rodney FNP.  No previous lumbar scans since 2015.  She does have hypertension history of breast cancer and chronic low back pain.  Review of Systems updated unchanged from last month.   Objective: Vital Signs: There were no vitals taken for this visit.  Physical Exam Constitutional:      Appearance: She is well-developed.  HENT:     Head: Normocephalic.     Right Ear: External ear normal.     Left Ear: External ear normal. There is no impacted cerumen.  Eyes:     Pupils: Pupils are equal, round, and reactive to light.  Neck:     Thyroid: No thyromegaly.     Trachea: No tracheal  deviation.  Cardiovascular:     Rate and Rhythm: Normal rate.  Pulmonary:     Effort: Pulmonary effort is normal.  Abdominal:     Palpations: Abdomen is soft.  Musculoskeletal:     Cervical back: No rigidity.  Skin:    General: Skin is warm and dry.  Neurological:     Mental Status: She is alert and oriented to person, place, and time.  Psychiatric:        Behavior: Behavior normal.     Ortho Exam  Specialty Comments:  No specialty comments available.  Imaging: No results found.   PMFS History: Patient Active Problem List   Diagnosis Date Noted   Trochanteric bursitis, left hip 08/03/2022   History of right breast cancer 04/18/2022   Back pain with sciatica 11/16/2020   Ductal carcinoma in situ (DCIS) of right breast 10/26/2020   Hyperlipidemia 08/08/2018   Osteoarthritis 10/12/2016   Osteopenia 09/06/2016   Upper airway cough syndrome 12/12/2014   Obstructive sleep apnea 12/23/2013   Essential hypertension, benign    Hiatal hernia    Diverticulosis of colon (without mention of hemorrhage) 12/30/2010   Gastritis, chronic 12/30/2010   IBS (irritable bowel syndrome) 08/25/2010   GERD (gastroesophageal reflux disease) 08/25/2010  Past Medical History:  Diagnosis Date   Barrett esophagus    Benign neoplasm of other and unspecified site of the digestive system 04/29/2007   Breast cancer (HCC) 10/13/2020   Chronic bronchitis (HCC)    Depression    Diverticulosis of colon (without mention of hemorrhage)    Esophageal reflux    Essential hypertension, benign    Gastritis    Grave's disease    Hiatal hernia    Hypothyroidism    IBS (irritable bowel syndrome)    Obesity    Osteopenia    Personal history of radiation therapy    Sleep apnea    Symptomatic menopausal or female climacteric states     Family History  Problem Relation Age of Onset   Hypertension Mother    Arthritis Mother    Osteoporosis Mother    Kidney disease Mother        related to APAP  use   Lung cancer Father    Hypertension Brother    Hypertension Daughter    Diabetes Daughter    Diabetes Son    Diabetes Other    Clotting disorder Other    Colon cancer Neg Hx     Past Surgical History:  Procedure Laterality Date   ABDOMINAL HYSTERECTOMY N/A    BREAST BIOPSY Right 09/22/2020   BREAST LUMPECTOMY Right 10/13/2020   BREAST LUMPECTOMY WITH RADIOACTIVE SEED LOCALIZATION Right 10/13/2020   Procedure: RIGHT BREAST LUMPECTOMY WITH RADIOACTIVE SEED LOCALIZATION;  Surgeon: Emelia Loron, MD;  Location: [facility];  Service: General;  Laterality: Right;   CHOLECYSTECTOMY     INCONTINENCE SURGERY     TONSILLECTOMY     Social History   Occupational History   Occupation: Hospice- Chaplain    Comment: [place]   Occupation: retired  Tobacco Use   Smoking status: Former    Current packs/day: 0.00    Average packs/day: 0.5 packs/day for 5.0 years (2.5 ttl pk-yrs)    Types: Cigarettes    Start date: 01/24/1963    Quit date: 01/24/1968    Years since quitting: 54.5   Smokeless tobacco: Never  Vaping Use   Vaping status: Never Used  Substance and Sexual Activity   Alcohol use: No    Alcohol/week: 0.0 standard drinks of alcohol   Drug use: No   Sexual activity: Yes

## 2022-08-28 ENCOUNTER — Other Ambulatory Visit: Payer: Self-pay | Admitting: Adult Health

## 2022-08-28 DIAGNOSIS — Z9889 Other specified postprocedural states: Secondary | ICD-10-CM

## 2022-08-29 DIAGNOSIS — H04123 Dry eye syndrome of bilateral lacrimal glands: Secondary | ICD-10-CM | POA: Diagnosis not present

## 2022-08-29 DIAGNOSIS — H40013 Open angle with borderline findings, low risk, bilateral: Secondary | ICD-10-CM | POA: Diagnosis not present

## 2022-08-29 DIAGNOSIS — H43813 Vitreous degeneration, bilateral: Secondary | ICD-10-CM | POA: Diagnosis not present

## 2022-08-29 DIAGNOSIS — Z961 Presence of intraocular lens: Secondary | ICD-10-CM | POA: Diagnosis not present

## 2022-08-29 DIAGNOSIS — H35371 Puckering of macula, right eye: Secondary | ICD-10-CM | POA: Diagnosis not present

## 2022-08-29 DIAGNOSIS — H25812 Combined forms of age-related cataract, left eye: Secondary | ICD-10-CM | POA: Diagnosis not present

## 2022-09-13 ENCOUNTER — Ambulatory Visit: Admission: RE | Admit: 2022-09-13 | Payer: Medicare Other | Source: Ambulatory Visit

## 2022-09-13 DIAGNOSIS — Z9889 Other specified postprocedural states: Secondary | ICD-10-CM

## 2022-09-13 DIAGNOSIS — Z853 Personal history of malignant neoplasm of breast: Secondary | ICD-10-CM | POA: Diagnosis not present

## 2022-09-17 ENCOUNTER — Other Ambulatory Visit: Payer: Self-pay | Admitting: Family

## 2022-09-17 DIAGNOSIS — I10 Essential (primary) hypertension: Secondary | ICD-10-CM

## 2022-09-18 ENCOUNTER — Other Ambulatory Visit: Payer: Self-pay | Admitting: Family

## 2022-09-18 DIAGNOSIS — I10 Essential (primary) hypertension: Secondary | ICD-10-CM

## 2022-09-21 DIAGNOSIS — H25812 Combined forms of age-related cataract, left eye: Secondary | ICD-10-CM | POA: Diagnosis not present

## 2022-09-21 DIAGNOSIS — H268 Other specified cataract: Secondary | ICD-10-CM | POA: Diagnosis not present

## 2022-10-12 DIAGNOSIS — D485 Neoplasm of uncertain behavior of skin: Secondary | ICD-10-CM | POA: Diagnosis not present

## 2022-10-12 DIAGNOSIS — Z7189 Other specified counseling: Secondary | ICD-10-CM | POA: Diagnosis not present

## 2022-10-12 DIAGNOSIS — D225 Melanocytic nevi of trunk: Secondary | ICD-10-CM | POA: Diagnosis not present

## 2022-10-12 DIAGNOSIS — L821 Other seborrheic keratosis: Secondary | ICD-10-CM | POA: Diagnosis not present

## 2022-10-12 DIAGNOSIS — C44319 Basal cell carcinoma of skin of other parts of face: Secondary | ICD-10-CM | POA: Diagnosis not present

## 2022-10-12 DIAGNOSIS — L820 Inflamed seborrheic keratosis: Secondary | ICD-10-CM | POA: Diagnosis not present

## 2022-10-13 ENCOUNTER — Encounter: Payer: Self-pay | Admitting: Nurse Practitioner

## 2022-10-13 ENCOUNTER — Ambulatory Visit: Payer: Medicare Other | Admitting: Nurse Practitioner

## 2022-10-13 VITALS — BP 114/77 | HR 66 | Temp 98.0°F | Resp 20 | Ht 65.0 in | Wt 198.0 lb

## 2022-10-13 DIAGNOSIS — J40 Bronchitis, not specified as acute or chronic: Secondary | ICD-10-CM

## 2022-10-13 MED ORDER — BENZONATATE 100 MG PO CAPS
100.0000 mg | ORAL_CAPSULE | Freq: Three times a day (TID) | ORAL | 0 refills | Status: DC | PRN
Start: 2022-10-13 — End: 2022-10-19

## 2022-10-13 MED ORDER — METHYLPREDNISOLONE ACETATE 80 MG/ML IJ SUSP
80.0000 mg | Freq: Once | INTRAMUSCULAR | Status: AC
Start: 2022-10-13 — End: 2022-10-13
  Administered 2022-10-13: 80 mg via INTRAMUSCULAR

## 2022-10-13 MED ORDER — AZITHROMYCIN 250 MG PO TABS
ORAL_TABLET | ORAL | 0 refills | Status: DC
Start: 2022-10-13 — End: 2022-10-19

## 2022-10-13 MED ORDER — PREDNISONE 20 MG PO TABS
ORAL_TABLET | ORAL | 0 refills | Status: DC
Start: 2022-10-13 — End: 2022-10-19

## 2022-10-13 NOTE — Patient Instructions (Signed)

## 2022-10-13 NOTE — Progress Notes (Signed)
Subjective:    Patient ID: Gilman Buttner, female    DOB: May 15, 1950, 72 y.o.   MRN: [id number]   Chief Complaint: Cough and Nasal Congestion   Cough This is a new problem. The current episode started in the past 7 days. The problem has been gradually worsening. The problem occurs hourly. The cough is Productive of sputum. Associated symptoms include ear congestion and rhinorrhea. Pertinent negatives include no chills, fever, sore throat or shortness of breath. She has tried OTC cough suppressant for the symptoms. The treatment provided mild relief.  Covid test at home was negative  Patient Active Problem List   Diagnosis Date Noted   Trochanteric bursitis, left hip 08/03/2022   History of right breast cancer 04/18/2022   Back pain with sciatica 11/16/2020   Ductal carcinoma in situ (DCIS) of right breast 10/26/2020   Hyperlipidemia 08/08/2018   Osteoarthritis 10/12/2016   Osteopenia 09/06/2016   Upper airway cough syndrome 12/12/2014   Obstructive sleep apnea 12/23/2013   Essential hypertension, benign    Hiatal hernia    Diverticulosis of colon (without mention of hemorrhage) 12/30/2010   Gastritis, chronic 12/30/2010   IBS (irritable bowel syndrome) 08/25/2010   GERD (gastroesophageal reflux disease) 08/25/2010       Review of Systems  Constitutional:  Positive for fatigue. Negative for chills and fever.  HENT:  Positive for rhinorrhea. Negative for sore throat.   Respiratory:  Positive for cough. Negative for shortness of breath.        Objective:   Physical Exam Constitutional:      Appearance: Normal appearance. She is obese.  HENT:     Right Ear: Tympanic membrane normal.     Left Ear: Tympanic membrane normal.     Nose: Nose normal.     Mouth/Throat:     Pharynx: No oropharyngeal exudate or posterior oropharyngeal erythema.  Cardiovascular:     Heart sounds: Normal heart sounds.  Pulmonary:     Effort: Pulmonary effort is normal.     Breath sounds:  Wheezing and rhonchi present.  Skin:    General: Skin is warm.  Neurological:     General: No focal deficit present.     Mental Status: She is alert and oriented to person, place, and time.  Psychiatric:        Mood and Affect: Mood normal.        Behavior: Behavior normal.    BP 114/77   Pulse 66   Temp 98 F (36.7 C) (Temporal)   Resp 20   Ht 5\' 5"  (1.651 m)   Wt 198 lb (89.8 kg)   SpO2 96%   BMI 32.95 kg/m         Assessment & Plan:   Silas [place] Battisti in today with chief complaint of Cough and Nasal Congestion   1. Bronchitis 1. Take meds as prescribed 2. Use a cool mist humidifier especially during the winter months and when heat has been humid. 3. Use saline nose sprays frequently 4. Saline irrigations of the nose can be very helpful if done frequently.  * 4X daily for 1 week*  * Use of a nettie pot can be helpful with this. Follow directions with this* 5. Drink plenty of fluids 6. Keep thermostat turn down low 7.For any cough or congestion- tessalon perles 8. For fever or aces or pains- take tylenol or ibuprofen appropriate for age and weight.  * for fevers greater than 101 orally you may alternate ibuprofen and tylenol  every  3 hours.    - methylPREDNISolone acetate (DEPO-MEDROL) injection 80 mg - predniSONE (DELTASONE) 20 MG tablet; 2 po at sametime daily for 5 days-  Dispense: 10 tablet; Refill: 0 - benzonatate (TESSALON PERLES) 100 MG capsule; Take 1 capsule (100 mg total) by mouth 3 (three) times daily as needed for cough.  Dispense: 20 capsule; Refill: 0 - azithromycin (ZITHROMAX Z-PAK) 250 MG tablet; As directed  Dispense: 6 tablet; Refill: 0    The above assessment and management plan was discussed with the patient. The patient verbalized understanding of and has agreed to the management plan. Patient is aware to call the clinic if symptoms persist or worsen. Patient is aware when to return to the clinic for a follow-up visit. Patient educated on when it  is appropriate to go to the emergency department.   Mary-Margaret Daphine Deutscher, FNP

## 2022-10-19 ENCOUNTER — Encounter: Payer: Self-pay | Admitting: Family

## 2022-10-19 ENCOUNTER — Ambulatory Visit: Payer: Medicare Other | Admitting: Family

## 2022-10-19 VITALS — BP 126/80 | HR 61 | Temp 97.8°F | Ht 65.0 in | Wt 196.0 lb

## 2022-10-19 DIAGNOSIS — M159 Polyosteoarthritis, unspecified: Secondary | ICD-10-CM

## 2022-10-19 DIAGNOSIS — I10 Essential (primary) hypertension: Secondary | ICD-10-CM | POA: Diagnosis not present

## 2022-10-19 DIAGNOSIS — G4733 Obstructive sleep apnea (adult) (pediatric): Secondary | ICD-10-CM

## 2022-10-19 DIAGNOSIS — M5430 Sciatica, unspecified side: Secondary | ICD-10-CM | POA: Diagnosis not present

## 2022-10-19 DIAGNOSIS — C4491 Basal cell carcinoma of skin, unspecified: Secondary | ICD-10-CM | POA: Insufficient documentation

## 2022-10-19 DIAGNOSIS — K219 Gastro-esophageal reflux disease without esophagitis: Secondary | ICD-10-CM

## 2022-10-19 DIAGNOSIS — C44319 Basal cell carcinoma of skin of other parts of face: Secondary | ICD-10-CM | POA: Diagnosis not present

## 2022-10-19 DIAGNOSIS — Z853 Personal history of malignant neoplasm of breast: Secondary | ICD-10-CM

## 2022-10-19 DIAGNOSIS — M549 Dorsalgia, unspecified: Secondary | ICD-10-CM | POA: Diagnosis not present

## 2022-10-19 DIAGNOSIS — Z0000 Encounter for general adult medical examination without abnormal findings: Secondary | ICD-10-CM | POA: Diagnosis not present

## 2022-10-19 DIAGNOSIS — Z0001 Encounter for general adult medical examination with abnormal findings: Secondary | ICD-10-CM | POA: Diagnosis not present

## 2022-10-19 DIAGNOSIS — E785 Hyperlipidemia, unspecified: Secondary | ICD-10-CM | POA: Diagnosis not present

## 2022-10-19 LAB — TSH

## 2022-10-19 LAB — CMP14+EGFR
ALT: 26 IU/L (ref 0–32)
AST: 21 IU/L (ref 0–40)
Albumin: 3.9 g/dL (ref 3.8–4.8)
Alkaline Phosphatase: 93 IU/L (ref 44–121)
BUN/Creatinine Ratio: 17 (ref 12–28)
BUN: 16 mg/dL (ref 8–27)
Bilirubin Total: 0.3 mg/dL (ref 0.0–1.2)
CO2: 30 mmol/L — ABNORMAL HIGH (ref 20–29)
Calcium: 10.2 mg/dL (ref 8.7–10.3)
Chloride: 101 mmol/L (ref 96–106)
Creatinine, Ser: 0.95 mg/dL (ref 0.57–1.00)
Globulin, Total: 2.7 g/dL (ref 1.5–4.5)
Glucose: 93 mg/dL (ref 70–99)
Potassium: 4.1 mmol/L (ref 3.5–5.2)
Sodium: 141 mmol/L (ref 134–144)
Total Protein: 6.6 g/dL (ref 6.0–8.5)
eGFR: 63 mL/min/{1.73_m2} (ref >59)

## 2022-10-19 LAB — CBC WITH DIFFERENTIAL/PLATELET
Basophils Absolute: 0 10*3/uL (ref 0.0–0.2)
Basos: 0 %
EOS (ABSOLUTE): 0.6 10*3/uL — ABNORMAL HIGH (ref 0.0–0.4)
Eos: 6 %
Hematocrit: 44.3 % (ref 34.0–46.6)
Hemoglobin: 14.1 g/dL (ref 11.1–15.9)
Immature Grans (Abs): 0 10*3/uL (ref 0.0–0.1)
Immature Granulocytes: 0 %
Lymphocytes Absolute: 3.1 10*3/uL (ref 0.7–3.1)
Lymphs: 31 %
MCH: 28.4 pg (ref 26.6–33.0)
MCHC: 31.8 g/dL (ref 31.5–35.7)
MCV: 89 fL (ref 79–97)
Monocytes Absolute: 0.7 10*3/uL (ref 0.1–0.9)
Monocytes: 7 %
Neutrophils Absolute: 5.5 10*3/uL (ref 1.4–7.0)
Neutrophils: 56 %
Platelets: 293 10*3/uL (ref 150–450)
RBC: 4.97 x10E6/uL (ref 3.77–5.28)
RDW: 13.7 % (ref 11.7–15.4)
WBC: 10 10*3/uL (ref 3.4–10.8)

## 2022-10-19 LAB — LIPID PANEL
Chol/HDL Ratio: 3 ratio (ref 0.0–4.4)
Cholesterol, Total: 136 mg/dL (ref 100–199)
HDL: 45 mg/dL (ref >39)
LDL Chol Calc (NIH): 69 mg/dL (ref 0–99)
Triglycerides: 121 mg/dL (ref 0–149)
VLDL Cholesterol Cal: 22 mg/dL (ref 5–40)

## 2022-10-19 NOTE — Patient Instructions (Signed)
Basal Cell Carcinoma Basal cell carcinoma is the most common form of skin cancer. It begins in the basal cells, which are at the bottom of the outer skin layer (epidermis). Basal cell carcinoma can often be cured. It rarely spreads to other areas of the body (metastasizes). It may come back at the same location (recur), but it can be treated again if this happens. Basal cell carcinoma occurs most often on parts of the body that are frequently exposed to the sun, such as: Parts of the head, including the scalp or face. Ears. Neck. Arms or legs. Backs of the hands. What are the causes? This condition is usually caused by exposure to ultraviolet (UV) light. UV light may come from the sun or from tanning beds. Other causes include: Exposure to arsenic, a highly poisonous metal. Exposure to high-energy X-rays (radiation). Exposure to toxic tars and oils. Certain genetic conditions, such as a condition that makes a person sensitive to sunlight. What increases the risk? You are more likely to develop this condition if: You are older than 74 years of age. You have: Fair skin (light complexion), blond or red hair, or blue, green, or gray eye color. Childhood freckling. Had repeated sunburns or sun exposure over long periods of time, especially during childhood. A weakened body defense system (immune system). Been exposed to certain chemicals, such as tar, soot, and arsenic. Chronic inflammatory conditions or infections. A family or personal history of basal cell carcinoma. You use tanning beds. What are the signs or symptoms? The main symptom of this condition is a growth or lesion on the skin. The shape and color of the growth or lesion may vary. The main types include: An open sore that may remain open for three weeks or longer. The sore may bleed or crust. This type of lesion can be an early sign of basal cell carcinoma. Basal cell carcinoma often shows up as a sore that does not heal. A  reddish area that may crust, itch, or cause discomfort. This may occur on areas that are exposed to the sun. These patches might be easier to feel than to see. A shiny or clear bump that is red, white, or pink. In people who have dark hair, the bump is often tan, black, or brown. These bumps can look like moles. A pink growth with a raised border. The growth will have a crusted and indented area in the center. Small blood vessels may appear on the surface of the growth as it gets bigger. A scar-like area that looks like shiny, stretched skin. The area may be white, yellow, or waxy. It often has irregular borders. This may be a sign of more aggressive basal cell carcinoma. How is this diagnosed? This condition may be diagnosed with: A physical exam. Removal of a tissue sample to be examined under a microscope (biopsy). How is this treated? Treatment for this condition involves removing the cancerous tissue. The method that is used for this depends on the type, size, location, and number of tumors. Possible treatments include: Surgery, such as: Mohs surgery. In this procedure, the cancerous skin cells are removed layer by layer until all of the tumor has been removed. Surgical removal (excision) of the tumor. This involves removing the entire tumor and a small amount of normal skin that surrounds it. Cryosurgery. This involves freezing the tumor with liquid nitrogen. Plastic surgery. The tumor is removed, and healthy skin from another part of the body is used to cover the wound. This  may be done for large tumors that are in areas where it is not possible to stretch the nearby skin to sew the edges of the wound together. Therapies or treatments, such as: Radiation. This may be used for tumors on the face. Photodynamic therapy. A chemical cream is applied to the skin, and light exposure is used to activate the chemical. Electrodesiccation and curettage. This involves alternately scraping and burning  the tumor while using an electric current to control bleeding. Chemical treatments, such as imiquimod cream and interferon injections. These may be used to remove superficial tumors with minimal scarring. Follow these instructions at home: Avoid direct exposure to the sun. Do self-exams as told by your health care provider. Look for new spots or changes in your skin. Keep all follow-up visits. This is important. How is this prevented?  Avoid the sun when it is the strongest. This is usually between 10 a.m. and 4 p.m. When you are out in the sun, use a sunscreen that has a sun protection factor (SPF) of at least 30. Apply sunscreen at least 30 minutes before exposure to the sun. Reapply sunscreen every 2-4 hours while you are outside. Also reapply it after swimming and after excessive sweating. Always wear hats, protective clothing, and UV-blocking sunglasses when you are outdoors. Do not use tanning beds. Contact a health care provider if: You notice any new spots or any changes in your skin. You have had a basal cell carcinoma tumor removed, and you notice a new growth in the same location. Get help right away if: You have a spot that is sore and does not heal. You have a spot that bleeds easily. Summary Basal cell carcinoma is the most common form of skin cancer. It begins in the bottom of the outer skin layer (epidermis). Basal cell carcinoma can almost always be cured. This condition is usually caused by exposure to ultraviolet (UV) light. It mostly affects the face, scalp, neck, ears, arms, legs, or backs of the hands. The main symptom of this condition is a growth or lesion on the skin that can vary in shape and color. You can prevent this cancer by avoiding direct exposure to the sun, applying sunscreen of at least 30 SPF, and wearing protective clothing. Apply sunscreen 30 minutes before you go out into the sun, and reapply every 2-4 hours while you are outside. This information is  not intended to replace advice given to you by your health care provider. Make sure you discuss any questions you have with your health care provider. Document Revised: 05/13/2020 Document Reviewed: 05/13/2020 Elsevier Patient Education  2024 [employer].

## 2022-10-19 NOTE — Progress Notes (Signed)
Subjective:    Patient ID: Laura James, female    DOB: 02-08-1950, 72 y.o.   MRN: [id number]  Chief Complaint  Patient presents with   Medical Management of Chronic Issues    SKIN CANCER SURGERY ON THE 15TH RIGHT EYE BROW   Pt presents to the office today for CPE and chronic follow up.  PT is followed by Ortho as needed  for back pain and bursitis of hip.     She has hx right breast cancer and followed by Oncologists annually.   Has a skin lesion on right eyebrow and followed by dermatologists and was just told it was basal cell and will have removed 11/07/22.   She has OSA and uses CPAP twice a week.   Hypertension This is a chronic problem. The current episode started more than 1 year ago. The problem has been resolved since onset. The problem is controlled. Pertinent negatives include no malaise/fatigue, peripheral edema or shortness of breath (when she was sick, but improving). Risk factors for coronary artery disease include dyslipidemia, obesity and sedentary lifestyle. The current treatment provides moderate improvement.  Gastroesophageal Reflux She complains of belching and heartburn. This is a chronic problem. The current episode started more than 1 year ago. The problem occurs occasionally. The symptoms are aggravated by certain foods. She has tried a PPI for the symptoms. The treatment provided moderate relief.  Back Pain This is a chronic problem. The current episode started more than 1 year ago. The problem occurs intermittently. The problem has been waxing and waning since onset. The pain is present in the lumbar spine. The quality of the pain is described as aching. The pain is at a severity of 4/10. The pain is mild. She has tried NSAIDs for the symptoms. The treatment provided moderate relief.  Arthritis Presents for follow-up visit. She complains of pain and stiffness. Affected locations include the left MCP, right MCP, right knee, left knee, left hip and right hip. Her  pain is at a severity of 5/10.  Hyperlipidemia This is a chronic problem. The current episode started more than 1 year ago. Exacerbating diseases include obesity. Pertinent negatives include no shortness of breath (when she was sick, but improving). Current antihyperlipidemic treatment includes statins. The current treatment provides moderate improvement of lipids. Risk factors for coronary artery disease include dyslipidemia, hypertension, a sedentary lifestyle and post-menopausal.      Review of Systems  Constitutional:  Negative for malaise/fatigue.  Respiratory:  Negative for shortness of breath (when she was sick, but improving).   Gastrointestinal:  Positive for heartburn.  Musculoskeletal:  Positive for arthritis, back pain and stiffness.  All other systems reviewed and are negative.  Family History  Problem Relation Age of Onset   Hypertension Mother    Arthritis Mother    Osteoporosis Mother    Kidney disease Mother        related to APAP use   Lung cancer Father    Hypertension Brother    Hypertension Daughter    Diabetes Daughter    Diabetes Son    Diabetes Other    Clotting disorder Other    Colon cancer Neg Hx    Social History   Socioeconomic History   Marital status: Married    Spouse name: Fredrik Cove   Number of children: 3   Years of education: 16   Highest education level: Master's degree (e.g., MA, MS, MEng, MEd, MSW, MBA)  Occupational History   Occupation: Hospice- Orthoptist  Comment: [employer]   Occupation: retired  Tobacco Use   Smoking status: Former    Current packs/day: 0.00    Average packs/day: 0.5 packs/day for 5.0 years (2.5 ttl pk-yrs)    Types: Cigarettes    Start date: 01/24/1963    Quit date: 01/24/1968    Years since quitting: 54.7   Smokeless tobacco: Never  Vaping Use   Vaping status: Never Used  Substance and Sexual Activity   Alcohol use: No    Alcohol/week: 0.0 standard drinks of alcohol   Drug use: No   Sexual activity:  Yes  Other Topics Concern   Not on file  Social History Narrative   Lives at home with husband    Social Determinants of Health   Financial Resource Strain: Low Risk  (04/14/2022)   Overall Financial Resource Strain (CARDIA)    Difficulty of Paying Living Expenses: Not hard at all  Food Insecurity: No Food Insecurity (04/14/2022)   Hunger Vital Sign    Worried About Running Out of Food in the Last Year: Never true    Ran Out of Food in the Last Year: Never true  Transportation Needs: No Transportation Needs (04/14/2022)   PRAPARE - Administrator, Civil Service (Medical): No    Lack of Transportation (Non-Medical): No  Physical Activity: Insufficiently Active (04/14/2022)   Exercise Vital Sign    Days of Exercise per Week: 2 days    Minutes of Exercise per Session: 30 min  Stress: No Stress Concern Present (04/14/2022)   [employer] of Occupational Health - Occupational Stress Questionnaire    Feeling of Stress : Not at all  Social Connections: Socially Integrated (04/14/2022)   Social Connection and Isolation Panel [NHANES]    Frequency of Communication with Friends and Family: More than three times a week    Frequency of Social Gatherings with Friends and Family: More than three times a week    Attends Religious Services: 1 to 4 times per year    Active Member of [employer] or Organizations: Yes    Attends Engineer, structural: More than 4 times per year    Marital Status: Married       Objective:   Physical Exam Vitals reviewed.  Constitutional:      General: She is not in acute distress.    Appearance: She is well-developed.  HENT:     Head: Normocephalic and atraumatic.     Right Ear: Tympanic membrane normal.     Left Ear: Tympanic membrane normal.  Eyes:     Pupils: Pupils are equal, round, and reactive to light.  Neck:     Thyroid: No thyromegaly.  Cardiovascular:     Rate and Rhythm: Normal rate and regular rhythm.     Heart sounds: Normal  heart sounds. No murmur heard. Pulmonary:     Effort: Pulmonary effort is normal. No respiratory distress.     Breath sounds: Normal breath sounds. No wheezing.  Abdominal:     General: Bowel sounds are normal. There is no distension.     Palpations: Abdomen is soft.     Tenderness: There is no abdominal tenderness.  Musculoskeletal:        General: No tenderness. Normal range of motion.     Cervical back: Normal range of motion and neck supple.  Skin:    General: Skin is warm and dry.          Comments: Dark brown flat skin lesion on right  eyebrow.   Neurological:     Mental Status: She is alert and oriented to person, place, and time.     Cranial Nerves: No cranial nerve deficit.     Deep Tendon Reflexes: Reflexes are normal and symmetric.  Psychiatric:        Behavior: Behavior normal.        Thought Content: Thought content normal.        Judgment: Judgment normal.       BP 126/80   Pulse 61   Temp 97.8 F (36.6 C) (Temporal)   Ht 5\' 5"  (1.651 m)   Wt 196 lb (88.9 kg)   SpO2 95%   BMI 32.62 kg/m      Assessment & Plan:  Levita Figeroa Prieto comes in today with chief complaint of Medical Management of Chronic Issues (SKIN CANCER SURGERY ON THE 15TH RIGHT EYE BROW)   Diagnosis and orders addressed:  1. Annual physical exam - CMP14+EGFR - CBC with Differential/Platelet - Lipid panel - TSH  2. Back pain with sciatica - CMP14+EGFR - CBC with Differential/Platelet  3. Essential hypertension, benign - CMP14+EGFR - CBC with Differential/Platelet  4. Gastroesophageal reflux disease without esophagitis - CMP14+EGFR - CBC with Differential/Platelet  5. History of right breast cancer - CMP14+EGFR - CBC with Differential/Platelet  6. Hyperlipidemia, unspecified hyperlipidemia type - CMP14+EGFR - CBC with Differential/Platelet - Lipid panel  7. Obstructive sleep apnea - CMP14+EGFR - CBC with Differential/Platelet  8. Primary osteoarthritis involving multiple  joints - CMP14+EGFR - CBC with Differential/Platelet  9. Basal cell carcinoma (BCC) of skin of other part of face Keep dermatologist follow up for 11/07/22 for Mohs surgery   Labs pending Continue all medications Health Maintenance reviewed Diet and exercise encouraged  Follow up plan: 6 months    Jannifer Rodney, FNP

## 2022-10-20 ENCOUNTER — Other Ambulatory Visit: Payer: Self-pay | Admitting: Family

## 2022-10-20 MED ORDER — ATORVASTATIN CALCIUM 20 MG PO TABS: 20.0000 mg | ORAL_TABLET | Freq: Every day | ORAL | 1 refills | Status: DC

## 2022-11-02 ENCOUNTER — Inpatient Hospital Stay: Payer: Medicare Other | Attending: Hematology and Oncology | Admitting: Hematology and Oncology

## 2022-11-02 VITALS — BP 129/64 | HR 74 | Temp 97.3°F | Resp 18 | Ht 65.0 in | Wt 195.5 lb

## 2022-11-02 DIAGNOSIS — Z1721 Progesterone receptor positive status: Secondary | ICD-10-CM | POA: Diagnosis not present

## 2022-11-02 DIAGNOSIS — Z923 Personal history of irradiation: Secondary | ICD-10-CM | POA: Diagnosis not present

## 2022-11-02 DIAGNOSIS — C44319 Basal cell carcinoma of skin of other parts of face: Secondary | ICD-10-CM | POA: Diagnosis not present

## 2022-11-02 DIAGNOSIS — Z79810 Long term (current) use of selective estrogen receptor modulators (SERMs): Secondary | ICD-10-CM | POA: Insufficient documentation

## 2022-11-02 DIAGNOSIS — D0511 Intraductal carcinoma in situ of right breast: Secondary | ICD-10-CM | POA: Diagnosis not present

## 2022-11-02 DIAGNOSIS — Z170 Estrogen receptor positive status [ER+]: Secondary | ICD-10-CM | POA: Insufficient documentation

## 2022-11-02 NOTE — Assessment & Plan Note (Addendum)
10/13/2020:Right lumpectomy: intermediate grade DCIS 0.6 cm with margins uninvolved by carcinoma.  ER 95%, PR 90%    Recommendation: 1. Followed by adjuvant radiation therapy (to be done at [facility]) completed 12/21/20 2. Followed by antiestrogen therapy with tamoxifen 5 years to be started 01/27/21 discontinued May 2023 because of profound vaginal discharge.  It resolved after she stopped it.    Breast cancer surveillance: 1.  Breast exam 06/08/2022: Benign 2. Mammogram 09/13/2022: Benign Density Cat B   Bone density 04/18/2022: T-score -2.1 osteopenia  Return to clinic in 1 year for long-term survivorship visit with Mardella Layman

## 2022-11-02 NOTE — Progress Notes (Signed)
Patient Care Team: Junie Spencer, FNP as PCP - General (Family Medicine) Langston Masker Estelle Grumbles, MD as Consulting Physician (Radiation Oncology) Serena Croissant, MD as Consulting Physician (Hematology and Oncology) Jenel Lucks, MD as Consulting Physician (Gastroenterology) Oretha Milch, MD as Consulting Physician (Pulmonary Disease) Emelia Loron, MD as Consulting Physician (General Surgery)  DIAGNOSIS:  Encounter Diagnosis  Name Primary?   Ductal carcinoma in situ (DCIS) of right breast Yes    SUMMARY OF ONCOLOGIC HISTORY: Oncology History  Ductal carcinoma in situ (DCIS) of right breast  09/13/2020 Initial Diagnosis   Screening mammogram: calcifications in the right breast. Diagnostic mammogram and [place]: 1 cm group of indeterminate calcifications within the upper outer right breast.    10/13/2020 Surgery   Right lumpectomy: intermediate grade DCIS 0.6 cm with margins uninvolved by carcinoma.  ER 95%, PR 90%   10/13/2020 Cancer Staging   Staging form: Breast, AJCC 8th Edition - Pathologic stage from 10/13/2020: Stage 0 (pTis (DCIS), pN0, cM0) - Signed by Loa Socks, NP on 04/26/2021 Stage prefix: Initial diagnosis   11/29/2020 - 12/22/2020 Radiation Therapy   Site: Right Breast Technique: 3D CRT Goal: Curative Whole Breast 4272 cGy in 267 cGy daily fractions (16 Fractions)   01/27/2021 -  Anti-estrogen oral therapy   Tamoxifen     CHIEF COMPLIANT:    History of Present Illness   The patient, with a history of DCIS diagnosed two years ago, presents for a routine follow-up. She reports a new diagnosis of basal cell carcinoma in the eyebrow, scheduled for excision. She has been doing well since discontinuing tamoxifen, with resolution of the associated vaginal discharge. The patient is aware of the  risk of breast cancer recurrence off tamoxifen, but has chosen to forego additional hormonal therapy.        ALLERGIES:  is allergic to adhesive [tape] and  latex.  MEDICATIONS:  Current Outpatient Medications  Medication Sig Dispense Refill   albuterol (PROAIR HFA) 108 (90 Base) MCG/ACT inhaler INHALE 2 PUFFS BY MOUTH EVERY 6 HOURS AS NEEDED FOR WHEEZE OR SHORTNESS OF BREATH 8.5 each 2   aspirin 81 MG tablet Take 81 mg by mouth daily.     atenolol (TENORMIN) 25 MG tablet TAKE 1 TABLET (25 MG TOTAL) BY MOUTH DAILY. (NEEDS TO BE SEEN BEFORE NEXT REFILL) 90 tablet 0   atorvastatin (LIPITOR) 20 MG tablet Take 1 tablet (20 mg total) by mouth daily. 90 tablet 1   celecoxib (CELEBREX) 200 MG capsule Take 1 capsule (200 mg total) by mouth 2 (two) times daily. 60 capsule 2   cetirizine (ZYRTEC) 10 MG tablet Take 10 mg by mouth daily as needed.      cholecalciferol (VITAMIN D) 1000 units tablet Take 1 tablet (1,000 Units total) by mouth daily.     Cyanocobalamin (B-12) 3000 MCG CAPS Take 1 capsule by mouth daily.     docusate sodium (COLACE) 100 MG capsule Take 200 mg by mouth 2 (two) times daily.     fluticasone (FLONASE) 50 MCG/ACT nasal spray SPRAY 2 SPRAYS INTO EACH NOSTRIL EVERY DAY 48 mL 1   hydrochlorothiazide (HYDRODIURIL) 25 MG tablet TAKE 1 TABLET (25 MG TOTAL) BY MOUTH DAILY. 90 tablet 0   methocarbamol (ROBAXIN) 500 MG tablet Take 1 tablet (500 mg total) by mouth every 6 (six) hours as needed for muscle spasms. 90 tablet 1   omeprazole (PRILOSEC OTC) 20 MG tablet Take 20 mg by mouth daily. Pt takes 1/2 tablet in the  morning and 1/2 tablet in the evening     No current facility-administered medications for this visit.    PHYSICAL EXAMINATION: ECOG PERFORMANCE STATUS: 1 - Symptomatic but completely ambulatory  Vitals:   11/02/22 1145  BP: 129/64  Pulse: 74  Resp: 18  Temp: (!) 97.3 F (36.3 C)  SpO2: 98%   Filed Weights   11/02/22 1145  Weight: 195 lb 8 oz (88.7 kg)    Physical Exam   BREAST: Not terribly dense, assessed as B. No abnormalities detected.   No palpable lumps or nodules in the breast  (exam performed in the  presence of a chaperone)  LABORATORY DATA:  I have reviewed the data as listed    Latest Ref Rng & Units 10/19/2022    9:04 AM 04/18/2022    9:11 AM 08/22/2021   12:44 PM  CMP  Glucose 70 - 99 mg/dL 93  130  865   BUN 8 - 27 mg/dL 16  18  13    Creatinine 0.57 - 1.00 mg/dL 7.84  6.96  2.95   Sodium 134 - 144 mmol/L 141  140  142   Potassium 3.5 - 5.2 mmol/L 4.1  3.7  3.9   Chloride 96 - 106 mmol/L 101  101  105   CO2 20 - 29 mmol/L 30  23  24    Calcium 8.7 - 10.3 mg/dL 28.4  9.8  9.3   Total Protein 6.0 - 8.5 g/dL 6.6  6.5  6.3   Total Bilirubin 0.0 - 1.2 mg/dL 0.3  0.3  <1.3   Alkaline Phos 44 - 121 IU/L 93  84  63   AST 0 - 40 IU/L 21  21  22    ALT 0 - 32 IU/L 26  22  16      Lab Results  Component Value Date   WBC 10.0 10/19/2022   HGB 14.1 10/19/2022   HCT 44.3 10/19/2022   MCV 89 10/19/2022   PLT 293 10/19/2022   NEUTROABS 5.5 10/19/2022    ASSESSMENT & PLAN:  Ductal carcinoma in situ (DCIS) of right breast 10/13/2020:Right lumpectomy: intermediate grade DCIS 0.6 cm with margins uninvolved by carcinoma.  ER 95%, PR 90%    Recommendation: 1. Followed by adjuvant radiation therapy (to be done at [facility]) completed 12/21/20 2. Followed by antiestrogen therapy with tamoxifen 5 years to be started 01/27/21 discontinued May 2023 because of profound vaginal discharge.  It resolved after she stopped it.    Breast cancer surveillance: 1.  Breast exam 06/08/2022: Benign 2. Mammogram 09/13/2022: Benign Density Cat B  Basal Cell Carcinoma New diagnosis, located on the eyebrow. Surgery scheduled for November 07, 2022. -Continue with planned surgery. Bone density 04/18/2022: T-score -2.1 osteopenia  Return to clinic in 1 year for long-term survivorship visit with Mardella Layman     No orders of the defined types were placed in this encounter.  The patient has a good understanding of the overall plan. she agrees with it. she will call with any problems that may develop before the next visit  here. Total time spent: 30 mins including face to face time and time spent for planning, charting and co-ordination of care   Tamsen Meek, MD 11/02/22

## 2022-11-07 DIAGNOSIS — C44319 Basal cell carcinoma of skin of other parts of face: Secondary | ICD-10-CM | POA: Diagnosis not present

## 2022-12-13 ENCOUNTER — Other Ambulatory Visit: Payer: Self-pay | Admitting: Family

## 2022-12-13 DIAGNOSIS — I10 Essential (primary) hypertension: Secondary | ICD-10-CM

## 2023-01-31 DIAGNOSIS — L821 Other seborrheic keratosis: Secondary | ICD-10-CM | POA: Diagnosis not present

## 2023-01-31 DIAGNOSIS — D485 Neoplasm of uncertain behavior of skin: Secondary | ICD-10-CM | POA: Diagnosis not present

## 2023-01-31 DIAGNOSIS — D1721 Benign lipomatous neoplasm of skin and subcutaneous tissue of right arm: Secondary | ICD-10-CM | POA: Diagnosis not present

## 2023-03-10 ENCOUNTER — Other Ambulatory Visit: Payer: Self-pay | Admitting: Family

## 2023-03-10 DIAGNOSIS — I10 Essential (primary) hypertension: Secondary | ICD-10-CM

## 2023-03-13 ENCOUNTER — Other Ambulatory Visit: Payer: Self-pay | Admitting: Family

## 2023-03-13 DIAGNOSIS — M150 Primary generalized (osteo)arthritis: Secondary | ICD-10-CM

## 2023-03-13 DIAGNOSIS — M549 Dorsalgia, unspecified: Secondary | ICD-10-CM

## 2023-03-28 DIAGNOSIS — D1721 Benign lipomatous neoplasm of skin and subcutaneous tissue of right arm: Secondary | ICD-10-CM | POA: Diagnosis not present

## 2023-03-28 DIAGNOSIS — Z7189 Other specified counseling: Secondary | ICD-10-CM | POA: Diagnosis not present

## 2023-03-28 DIAGNOSIS — D225 Melanocytic nevi of trunk: Secondary | ICD-10-CM | POA: Diagnosis not present

## 2023-03-28 DIAGNOSIS — L821 Other seborrheic keratosis: Secondary | ICD-10-CM | POA: Diagnosis not present

## 2023-04-11 ENCOUNTER — Other Ambulatory Visit: Payer: Self-pay | Admitting: Family

## 2023-04-11 DIAGNOSIS — M150 Primary generalized (osteo)arthritis: Secondary | ICD-10-CM

## 2023-04-11 DIAGNOSIS — M549 Dorsalgia, unspecified: Secondary | ICD-10-CM

## 2023-05-01 ENCOUNTER — Ambulatory Visit (INDEPENDENT_AMBULATORY_CARE_PROVIDER_SITE_OTHER): Payer: Medicare Other

## 2023-05-01 VITALS — Ht 65.0 in | Wt 195.0 lb

## 2023-05-01 DIAGNOSIS — Z0000 Encounter for general adult medical examination without abnormal findings: Secondary | ICD-10-CM

## 2023-05-01 NOTE — Patient Instructions (Signed)
 Laura James , Thank you for taking time to come for your Medicare Wellness Visit. I appreciate your ongoing commitment to your health goals. Please review the following plan we discussed and let me know if I can assist you in the future.   Referrals/Orders/Follow-Ups/Clinician Recommendations: Aim for 30 minutes of exercise or brisk walking, 6-8 glasses of water, and 5 servings of fruits and vegetables each day.  This is a list of the screening recommended for you and due dates:  Health Maintenance  Topic Date Due   COVID-19 Vaccine (8 - Moderna risk 2024-25 season) 06/13/2023   Flu Shot  08/24/2023   Mammogram  09/13/2023   DTaP/Tdap/Td vaccine (4 - Td or Tdap) 12/24/2023   Colon Cancer Screening  03/21/2024   DEXA scan (bone density measurement)  04/17/2024   Medicare Annual Wellness Visit  04/30/2024   Pneumonia Vaccine  Completed   Hepatitis C Screening  Completed   Zoster (Shingles) Vaccine  Completed   HPV Vaccine  Aged Out    Advanced directives: (ACP Link)Information on Advanced Care Planning can be found at [facility] Advance Health Care Directives Advance Health Care Directives. [email or web address]   Next Medicare Annual Wellness Visit scheduled for next year: Yes

## 2023-05-01 NOTE — Progress Notes (Signed)
 Subjective:   Laura James is a 73 y.o. who presents for a Medicare Wellness preventive visit.  Visit Complete: Virtual I connected with  Silas [place] Difiore on 05/01/23 by a audio enabled telemedicine application and verified that I am speaking with the correct person using two identifiers.  Patient Location: Home  Provider Location: Home Office  I discussed the limitations of evaluation and management by telemedicine. The patient expressed understanding and agreed to proceed.  Vital Signs: Because this visit was a virtual/telehealth visit, some criteria may be missing or patient reported. Any vitals not documented were not able to be obtained and vitals that have been documented are patient reported.  VideoDeclined- This patient declined Librarian, academic. Therefore the visit was completed with audio only.  Persons Participating in Visit: Patient.  AWV Questionnaire: No: Patient Medicare AWV questionnaire was not completed prior to this visit.  Cardiac Risk Factors include: advanced age (>63men, >23 women);dyslipidemia;hypertension     Objective:    Today's Vitals   05/01/23 0856  Weight: 195 lb (88.5 kg)  Height: 5\' 5"  (1.651 m)   Body mass index is 32.45 kg/m.     05/01/2023    9:11 AM 03/23/2022    8:13 AM 12/21/2020    2:15 PM 10/26/2020   10:36 AM 10/13/2020    6:51 AM 10/04/2020    2:02 PM 12/17/2019    8:42 AM  Advanced Directives  Does Patient Have a Medical Advance Directive? No No Yes No No No Yes  Type of Surveyor, minerals;Living will    Healthcare Power of [place];Living will  Does patient want to make changes to medical advance directive?       No - Patient declined  Copy of Healthcare Power of Attorney in Chart?   No - copy requested    No - copy requested  Would patient like information on creating a medical advance directive? Yes (MAU/Ambulatory/Procedural Areas - Information given) No - Patient  declined  No - Patient declined Yes (MAU/Ambulatory/Procedural Areas - Information given) No - Patient declined     Current Medications (verified) Outpatient Encounter Medications as of 05/01/2023  Medication Sig   albuterol (PROAIR HFA) 108 (90 Base) MCG/ACT inhaler INHALE 2 PUFFS BY MOUTH EVERY 6 HOURS AS NEEDED FOR WHEEZE OR SHORTNESS OF BREATH   aspirin 81 MG tablet Take 81 mg by mouth daily.   atenolol (TENORMIN) 25 MG tablet TAKE 1 TABLET (25 MG TOTAL) BY MOUTH DAILY. (NEEDS TO BE SEEN BEFORE NEXT REFILL)   atorvastatin (LIPITOR) 20 MG tablet Take 1 tablet (20 mg total) by mouth daily. **NEEDS TO BE SEEN BEFORE NEXT REFILL**   celecoxib (CELEBREX) 200 MG capsule TAKE 1 CAPSULE (200 MG TOTAL) BY MOUTH 2 (TWO) TIMES DAILY. **NEEDS TO BE SEEN BEFORE NEXT REFILL**   cetirizine (ZYRTEC) 10 MG tablet Take 10 mg by mouth daily as needed.    cholecalciferol (VITAMIN D) 1000 units tablet Take 1 tablet (1,000 Units total) by mouth daily.   Cyanocobalamin (B-12) 3000 MCG CAPS Take 1 capsule by mouth daily.   docusate sodium (COLACE) 100 MG capsule Take 200 mg by mouth 2 (two) times daily.   fluticasone (FLONASE) 50 MCG/ACT nasal spray SPRAY 2 SPRAYS INTO EACH NOSTRIL EVERY DAY   hydrochlorothiazide (HYDRODIURIL) 25 MG tablet TAKE 1 TABLET (25 MG TOTAL) BY MOUTH DAILY.   methocarbamol (ROBAXIN) 500 MG tablet Take 1 tablet (500 mg total) by mouth every 6 (  six) hours as needed for muscle spasms.   omeprazole (PRILOSEC OTC) 20 MG tablet Take 20 mg by mouth daily. Pt takes 1/2 tablet in the morning and 1/2 tablet in the evening   No facility-administered encounter medications on file as of 05/01/2023.    Allergies (verified) Adhesive [tape] and Latex   History: Past Medical History:  Diagnosis Date   Barrett esophagus    Benign neoplasm of other and unspecified site of the digestive system 04/29/2007   Breast cancer (HCC) 10/13/2020   Chronic bronchitis (HCC)    Depression    Diverticulosis of  colon (without mention of hemorrhage)    Esophageal reflux    Essential hypertension, benign    Gastritis    Grave's disease    Hiatal hernia    Hypothyroidism    IBS (irritable bowel syndrome)    Obesity    Osteopenia    Personal history of radiation therapy    Sleep apnea    Symptomatic menopausal or female climacteric states    Past Surgical History:  Procedure Laterality Date   ABDOMINAL HYSTERECTOMY N/A    BREAST BIOPSY Right 09/22/2020   BREAST LUMPECTOMY Right 10/13/2020   BREAST LUMPECTOMY WITH RADIOACTIVE SEED LOCALIZATION Right 10/13/2020   Procedure: RIGHT BREAST LUMPECTOMY WITH RADIOACTIVE SEED LOCALIZATION;  Surgeon: Emelia Loron, MD;  Location: [facility];  Service: General;  Laterality: Right;   CHOLECYSTECTOMY     INCONTINENCE SURGERY     TONSILLECTOMY     Family History  Problem Relation Age of Onset   Hypertension Mother    Arthritis Mother    Osteoporosis Mother    Kidney disease Mother        related to APAP use   Lung cancer Father    Hypertension Brother    Hypertension Daughter    Diabetes Daughter    Diabetes Son    Diabetes Other    Clotting disorder Other    Colon cancer Neg Hx    Social History   Socioeconomic History   Marital status: Married    Spouse name: Fredrik Cove   Number of children: 3   Years of education: 16   Highest education level: Master's degree (e.g., MA, MS, MEng, MEd, MSW, MBA)  Occupational History   Occupation: Hospice- Chaplain    Comment: [place]   Occupation: retired  Tobacco Use   Smoking status: Former    Current packs/day: 0.00    Average packs/day: 0.5 packs/day for 5.0 years (2.5 ttl pk-yrs)    Types: Cigarettes    Start date: 01/24/1963    Quit date: 01/24/1968    Years since quitting: 55.3   Smokeless tobacco: Never  Vaping Use   Vaping status: Never Used  Substance and Sexual Activity   Alcohol use: No    Alcohol/week: 0.0 standard drinks of alcohol   Drug use: No    Sexual activity: Yes  Other Topics Concern   Not on file  Social History Narrative   Lives at home with husband    Social Drivers of Corporate investment banker Strain: Low Risk  (05/01/2023)   Overall Financial Resource Strain (CARDIA)    Difficulty of Paying Living Expenses: Not hard at all  Food Insecurity: No Food Insecurity (05/01/2023)   Hunger Vital Sign    Worried About Running Out of Food in the Last Year: Never true    Ran Out of Food in the Last Year: Never true  Transportation Needs: No Transportation Needs (05/01/2023)  PRAPARE - Administrator, Civil Service (Medical): No    Lack of Transportation (Non-Medical): No  Physical Activity: Insufficiently Active (05/01/2023)   Exercise Vital Sign    Days of Exercise per Week: 3 days    Minutes of Exercise per Session: 30 min  Stress: No Stress Concern Present (05/01/2023)   [employer] of Occupational Health - Occupational Stress Questionnaire    Feeling of Stress : Not at all  Social Connections: Socially Integrated (05/01/2023)   Social Connection and Isolation Panel [NHANES]    Frequency of Communication with Friends and Family: More than three times a week    Frequency of Social Gatherings with Friends and Family: Three times a week    Attends Religious Services: More than 4 times per year    Active Member of Clubs or Organizations: Yes    Attends Engineer, structural: More than 4 times per year    Marital Status: Married    Tobacco Counseling Counseling given: Not Answered    Clinical Intake:  Pre-visit preparation completed: Yes  Pain : No/denies pain     Diabetes: No  No results found for: "HGBA1C"   How often do you need to have someone help you when you read instructions, pamphlets, or other written materials from your doctor or pharmacy?: 1 - Never  Interpreter Needed?: No  Information entered by :: Kandis Fantasia LPN   Activities of Daily Living     05/01/2023    9:11 AM   In your present state of health, do you have any difficulty performing the following activities:  Hearing? 0  Vision? 0  Difficulty concentrating or making decisions? 0  Walking or climbing stairs? 0  Dressing or bathing? 0  Doing errands, shopping? 0  Preparing Food and eating ? N  Using the Toilet? N  In the past six months, have you accidently leaked urine? N  Do you have problems with loss of bowel control? N  Managing your Medications? N  Managing your Finances? N  Housekeeping or managing your Housekeeping? N    Patient Care Team: Junie Spencer, FNP as PCP - General (Family Medicine) Langston Masker Estelle Grumbles, MD as Consulting Physician (Radiation Oncology) Serena Croissant, MD as Consulting Physician (Hematology and Oncology) Jenel Lucks, MD as Consulting Physician (Gastroenterology) Oretha Milch, MD as Consulting Physician (Pulmonary Disease) Emelia Loron, MD as Consulting Physician (General Surgery) [facility] Modena Jansky, MD as Referring Physician (Dermatology)  Indicate any recent Medical Services you may have received from other than [facility] providers in the past year (date may be approximate).     Assessment:   This is a routine wellness examination for [place].  Hearing/Vision screen Hearing Screening - Comments:: Denies hearing difficulties   Vision Screening - Comments:: Wears rx glasses - up to date with routine eye exams with [facility]     Goals Addressed   None    Depression Screen     05/01/2023    9:08 AM 10/19/2022    8:33 AM 10/13/2022    2:41 PM 04/18/2022    8:29 AM 04/18/2022    8:28 AM 03/23/2022    8:12 AM 12/30/2021   11:00 AM  PHQ 2/9 Scores  PHQ - 2 Score 0 0 0 0 0 0 0  PHQ- 9 Score  0 0 2   2    Fall Risk     05/01/2023    9:11 AM 10/13/2022  2:41 PM 04/18/2022    8:28 AM 03/23/2022    8:11 AM 12/30/2021   11:00 AM  Fall Risk   Falls in the past year? 0 0 0 0 0  Number falls in past yr: 0  0 0    Injury with Fall? 0  0 0   Risk for fall due to : No Fall Risks  No Fall Risks No Fall Risks   Follow up Falls prevention discussed;Education provided;Falls evaluation completed  Falls evaluation completed Falls prevention discussed     MEDICARE RISK AT HOME:  Medicare Risk at Home Any stairs in or around the home?: No If so, are there any without handrails?: No Home free of loose throw rugs in walkways, pet beds, electrical cords, etc?: Yes Adequate lighting in your home to reduce risk of falls?: Yes Life alert?: No Use of a cane, walker or w/c?: No Grab bars in the bathroom?: Yes Shower chair or bench in shower?: No Elevated toilet seat or a handicapped toilet?: Yes  TIMED UP AND GO:  Was the test performed?  No  Cognitive Function: 6CIT completed    11/01/2017   10:42 AM 08/02/2016    9:33 AM  MMSE - Mini Mental State Exam  Orientation to time 5 5  Orientation to Place 5 5  Registration 3 3  Attention/ Calculation 5 5  Recall 3 3  Language- name 2 objects 2 2  Language- repeat 1 1  Language- follow 3 step command 3 3  Language- read & follow direction 1 1  Write a sentence 1 1  Copy design 1 1  Total score 30 30        05/01/2023    9:11 AM 03/23/2022    8:14 AM 12/17/2019    8:44 AM 12/11/2018    8:41 AM  6CIT Screen  What Year? 0 points 0 points 0 points 0 points  What month? 0 points 0 points 0 points 0 points  What time? 0 points 0 points 0 points 0 points  Count back from 20 0 points 0 points 0 points 0 points  Months in reverse 0 points 0 points 0 points 0 points  Repeat phrase 0 points 0 points 0 points 0 points  Total Score 0 points 0 points 0 points 0 points    Immunizations Immunization History  Administered Date(s) Administered   Fluad Quad(high Dose 65+) 11/05/2018, 12/11/2019, 11/16/2020, 12/18/2021   Influenza, High Dose Seasonal PF 01/19/2016, 11/24/2016   Influenza-Unspecified 11/23/2013, 11/04/2020   MMR 06/12/1994   Moderna Covid-19  Fall Seasonal Vaccine 41yrs & older 12/18/2021, 12/14/2022   Moderna Covid-19 Vaccine Bivalent Booster 16yrs & up 12/27/2020   Moderna Sars-Covid-2 Vaccination 03/06/2019, 04/04/2019, 11/27/2019   Pneumococcal Conjugate-13 01/19/2016   Pneumococcal Polysaccharide-23 11/01/2017   Td 06/14/1994   Td,absorbed, Preservative Free, Adult Use, Lf Unspecified 08/17/2004   Tdap 12/23/2013   Unspecified SARS-COV-2 Vaccination 10/23/2020   Zoster Recombinant(Shingrix) 09/23/2020, 05/25/2021   Zoster, Live 06/28/2015    Screening Tests Health Maintenance  Topic Date Due   COVID-19 Vaccine (8 - Moderna risk 2024-25 season) 06/13/2023   INFLUENZA VACCINE  08/24/2023   MAMMOGRAM  09/13/2023   DTaP/Tdap/Td (4 - Td or Tdap) 12/24/2023   Colonoscopy  03/21/2024   DEXA SCAN  04/17/2024   Medicare Annual Wellness (AWV)  04/30/2024   Pneumonia Vaccine 54+ Years old  Completed   Hepatitis C Screening  Completed   Zoster Vaccines- Shingrix  Completed   HPV VACCINES  Aged Out    Health Maintenance  There are no preventive care reminders to display for this patient.  Additional Screening:  Vision Screening: Recommended annual ophthalmology exams for early detection of glaucoma and other disorders of the eye.  Dental Screening: Recommended annual dental exams for proper oral hygiene  Community Resource Referral / Chronic Care Management: CRR required this visit?  No   CCM required this visit?  No     Plan:     I have personally reviewed and noted the following in the patient's chart:   Medical and social history Use of alcohol, tobacco or illicit drugs  Current medications and supplements including opioid prescriptions. Patient is not currently taking opioid prescriptions. Functional ability and status Nutritional status Physical activity Advanced directives List of other physicians Hospitalizations, surgeries, and ER visits in previous 12 months Vitals Screenings to include  cognitive, depression, and falls Referrals and appointments  In addition, I have reviewed and discussed with patient certain preventive protocols, quality metrics, and best practice recommendations. A written personalized care plan for preventive services as well as general preventive health recommendations were provided to patient.     Kandis Fantasia [place], [place]   01/28/1094   After Visit Summary: (MyChart) Due to this being a telephonic visit, the after visit summary with patients personalized plan was offered to patient via MyChart   Notes: Nothing significant to report at this time.

## 2023-05-02 DIAGNOSIS — D485 Neoplasm of uncertain behavior of skin: Secondary | ICD-10-CM | POA: Diagnosis not present

## 2023-05-02 DIAGNOSIS — L820 Inflamed seborrheic keratosis: Secondary | ICD-10-CM | POA: Diagnosis not present

## 2023-05-04 ENCOUNTER — Other Ambulatory Visit: Payer: Self-pay | Admitting: Family

## 2023-05-07 DIAGNOSIS — H40013 Open angle with borderline findings, low risk, bilateral: Secondary | ICD-10-CM | POA: Diagnosis not present

## 2023-05-07 DIAGNOSIS — H35371 Puckering of macula, right eye: Secondary | ICD-10-CM | POA: Diagnosis not present

## 2023-05-07 DIAGNOSIS — H04123 Dry eye syndrome of bilateral lacrimal glands: Secondary | ICD-10-CM | POA: Diagnosis not present

## 2023-05-07 DIAGNOSIS — H43813 Vitreous degeneration, bilateral: Secondary | ICD-10-CM | POA: Diagnosis not present

## 2023-05-25 ENCOUNTER — Ambulatory Visit (INDEPENDENT_AMBULATORY_CARE_PROVIDER_SITE_OTHER)

## 2023-05-25 NOTE — Progress Notes (Signed)
 Patient is in office today for a nurse visit for Blood Pressure Check. Patient blood pressure was 153/82, Patient No chest pain. Patient brought their blood pressure machine from home, blood pressure was 177/108, checked with office machine 153/82. Rechecked with home machine 5 minutes later, reading was 141/77. Rechecked with office machine, reading was 141/77. Advised patient to schedule appointment to follow up with PCP.

## 2023-06-06 ENCOUNTER — Telehealth: Payer: Self-pay | Admitting: Adult Health

## 2023-06-10 ENCOUNTER — Other Ambulatory Visit: Payer: Self-pay | Admitting: Family

## 2023-06-10 DIAGNOSIS — I10 Essential (primary) hypertension: Secondary | ICD-10-CM

## 2023-06-11 ENCOUNTER — Inpatient Hospital Stay: Payer: Medicare Other | Admitting: Adult Health

## 2023-06-14 ENCOUNTER — Inpatient Hospital Stay: Attending: Adult Health | Admitting: Adult Health

## 2023-06-14 ENCOUNTER — Encounter: Payer: Self-pay | Admitting: Adult Health

## 2023-06-14 VITALS — BP 125/74 | HR 98 | Temp 98.4°F | Resp 18 | Ht 65.0 in | Wt 197.2 lb

## 2023-06-14 DIAGNOSIS — Z801 Family history of malignant neoplasm of trachea, bronchus and lung: Secondary | ICD-10-CM | POA: Insufficient documentation

## 2023-06-14 DIAGNOSIS — Z87891 Personal history of nicotine dependence: Secondary | ICD-10-CM | POA: Diagnosis not present

## 2023-06-14 DIAGNOSIS — D0511 Intraductal carcinoma in situ of right breast: Secondary | ICD-10-CM | POA: Diagnosis not present

## 2023-06-14 NOTE — Progress Notes (Unsigned)
 [facility] Cancer Follow up:    Laura Hem, FNP [address] [place] [address]   DIAGNOSIS: Cancer Staging  Ductal carcinoma in situ (DCIS) of right breast Staging form: Breast, AJCC 8th Edition - Pathologic stage from 10/13/2020: Stage 0 (pTis (DCIS), pN0, cM0) - Signed by Percival Brace, NP on 04/26/2021 Stage prefix: Initial diagnosis    SUMMARY OF ONCOLOGIC HISTORY: Oncology History  Ductal carcinoma in situ (DCIS) of right breast  09/13/2020 Initial Diagnosis   Screening mammogram: calcifications in the right breast. Diagnostic mammogram and US : 1 cm group of indeterminate calcifications within the upper outer right breast.    10/13/2020 Surgery   Right lumpectomy: intermediate grade DCIS 0.6 cm with margins uninvolved by carcinoma.  ER 95%, PR 90%   10/13/2020 Cancer Staging   Staging form: Breast, AJCC 8th Edition - Pathologic stage from 10/13/2020: Stage 0 (pTis (DCIS), pN0, cM0) - Signed by Percival Brace, NP on 04/26/2021 Stage prefix: Initial diagnosis   11/29/2020 - 12/22/2020 Radiation Therapy   Site: Right Breast Technique: 3D CRT Goal: Curative Whole Breast 4272 cGy in 267 cGy daily fractions (16 Fractions)   01/27/2021 -  Anti-estrogen oral therapy   Tamoxifen      CURRENT THERAPY: Observation  INTERVAL HISTORY:   Laura James 73 y.o. female returns for follow-up of her DCIS.  He continues on observation alone.  Breast recent bilateral breast mammogram occurred on September 13, 2022 demonstrating no mammographic evidence of malignancy and breast density category B.  She has no breast changes since that time.  She notes she has a blood pressure issue and attributes this to family stressors.    She noticed a skin change on her eyebrow that wouldn't heal and underwent excision of a basal cell carcinoma with dermatology.  She is otherwise doing well.  She is walking some, and is is eating fruits and vegetables.      Patient Active Problem List   Diagnosis Date Noted   Basal cell carcinoma 10/19/2022   Trochanteric bursitis, left hip 08/03/2022   History of right breast cancer 04/18/2022   Back pain with sciatica 11/16/2020   Ductal carcinoma in situ (DCIS) of right breast 10/26/2020   Hyperlipidemia 08/08/2018   Osteoarthritis 10/12/2016   Osteopenia 09/06/2016   Obstructive sleep apnea 12/23/2013   Essential hypertension, benign    Hiatal hernia    Diverticulosis of colon 12/30/2010   Gastritis, chronic 12/30/2010   IBS (irritable bowel syndrome) 08/25/2010   GERD (gastroesophageal reflux disease) 08/25/2010    is allergic to adhesive [tape] and latex.  MEDICAL HISTORY: Past Medical History:  Diagnosis Date   Barrett esophagus    Benign neoplasm of other and unspecified site of the digestive system 04/29/2007   Breast cancer (HCC) 10/13/2020   Chronic bronchitis (HCC)    Depression    Diverticulosis of colon (without mention of hemorrhage)    Esophageal reflux    Essential hypertension, benign    Gastritis    Grave's disease    Hiatal hernia    Hypothyroidism    IBS (irritable bowel syndrome)    Obesity    Osteopenia    Personal history of radiation therapy    Sleep apnea    Symptomatic menopausal or female climacteric states     SURGICAL HISTORY: Past Surgical History:  Procedure Laterality Date   ABDOMINAL HYSTERECTOMY N/A    BREAST BIOPSY Right 09/22/2020   BREAST LUMPECTOMY Right 10/13/2020   BREAST  LUMPECTOMY WITH RADIOACTIVE SEED LOCALIZATION Right 10/13/2020   Procedure: RIGHT BREAST LUMPECTOMY WITH RADIOACTIVE SEED LOCALIZATION;  Surgeon: Enid Harry, MD;  Location: [facility];  Service: General;  Laterality: Right;   CHOLECYSTECTOMY     INCONTINENCE SURGERY     TONSILLECTOMY      SOCIAL HISTORY: Social History   Socioeconomic History   Marital status: Married    Spouse name: Roger   Number of children: 3   Years of education:  16   Highest education level: Master's degree (e.g., MA, MS, MEng, MEd, MSW, MBA)  Occupational History   Occupation: Hospice- Chaplain    Comment: [place]   Occupation: retired  Tobacco Use   Smoking status: Former    Current packs/day: 0.00    Average packs/day: 0.5 packs/day for 5.0 years (2.5 ttl pk-yrs)    Types: Cigarettes    Start date: 01/24/1963    Quit date: 01/24/1968    Years since quitting: 55.4   Smokeless tobacco: Never  Vaping Use   Vaping status: Never Used  Substance and Sexual Activity   Alcohol use: No    Alcohol/week: 0.0 standard drinks of alcohol   Drug use: No   Sexual activity: Yes  Other Topics Concern   Not on file  Social History Narrative   Lives at home with husband    Social Drivers of Corporate investment banker Strain: Low Risk  (05/01/2023)   Overall Financial Resource Strain (CARDIA)    Difficulty of Paying Living Expenses: Not hard at all  Food Insecurity: No Food Insecurity (05/01/2023)   Hunger Vital Sign    Worried About Running Out of Food in the Last Year: Never true    Ran Out of Food in the Last Year: Never true  Transportation Needs: No Transportation Needs (05/01/2023)   PRAPARE - Administrator, Civil Service (Medical): No    Lack of Transportation (Non-Medical): No  Physical Activity: Insufficiently Active (05/01/2023)   Exercise Vital Sign    Days of Exercise per Week: 3 days    Minutes of Exercise per Session: 30 min  Stress: No Stress Concern Present (05/01/2023)   [employer] of Occupational Health - Occupational Stress Questionnaire    Feeling of Stress : Not at all  Social Connections: Socially Integrated (05/01/2023)   Social Connection and Isolation Panel [NHANES]    Frequency of Communication with Friends and Family: More than three times a week    Frequency of Social Gatherings with Friends and Family: Three times a week    Attends Religious Services: More than 4 times per year    Active Member of  Clubs or Organizations: Yes    Attends Banker Meetings: More than 4 times per year    Marital Status: Married  Catering manager Violence: Not At Risk (05/01/2023)   Humiliation, Afraid, Rape, and Kick questionnaire    Fear of Current or Ex-Partner: No    Emotionally Abused: No    Physically Abused: No    Sexually Abused: No    FAMILY HISTORY: Family History  Problem Relation Age of Onset   Hypertension Mother    Arthritis Mother    Osteoporosis Mother    Kidney disease Mother        related to APAP use   Lung cancer Father    Hypertension Brother    Hypertension Daughter    Diabetes Daughter    Diabetes Son    Diabetes Other  Clotting disorder Other    Colon cancer Neg Hx     Review of Systems  Constitutional:  Negative for appetite change, chills, fatigue, fever and unexpected weight change.  HENT:   Negative for hearing loss, lump/mass and trouble swallowing.   Eyes:  Negative for eye problems and icterus.  Respiratory:  Negative for chest tightness, cough and shortness of breath.   Cardiovascular:  Negative for chest pain, leg swelling and palpitations.  Gastrointestinal:  Negative for abdominal distention, abdominal pain, constipation, diarrhea, nausea and vomiting.  Endocrine: Negative for hot flashes.  Genitourinary:  Negative for difficulty urinating.   Musculoskeletal:  Negative for arthralgias.  Skin:  Negative for itching and rash.  Neurological:  Negative for dizziness, extremity weakness, headaches and numbness.  Hematological:  Negative for adenopathy. Does not bruise/bleed easily.  Psychiatric/Behavioral:  Negative for depression. The patient is not nervous/anxious.       PHYSICAL EXAMINATION    There were no vitals filed for this visit.  Physical Exam Constitutional:      General: She is not in acute distress.    Appearance: Normal appearance. She is not toxic-appearing.  HENT:     Head: Normocephalic and atraumatic.      Mouth/Throat:     Mouth: Mucous membranes are moist.     Pharynx: Oropharynx is clear. No oropharyngeal exudate or posterior oropharyngeal erythema.  Eyes:     General: No scleral icterus. Cardiovascular:     Rate and Rhythm: Normal rate and regular rhythm.     Pulses: Normal pulses.     Heart sounds: Normal heart sounds.  Pulmonary:     Effort: Pulmonary effort is normal.     Breath sounds: Normal breath sounds.  Chest:     Comments: Right breast status postlumpectomy and radiation no sign of local recurrence left breast is benign. Abdominal:     General: Abdomen is flat. Bowel sounds are normal. There is no distension.     Palpations: Abdomen is soft.     Tenderness: There is no abdominal tenderness.  Musculoskeletal:        General: No swelling.     Cervical back: Neck supple.  Lymphadenopathy:     Cervical: No cervical adenopathy.     Upper Body:     Right upper body: No supraclavicular or axillary adenopathy.     Left upper body: No supraclavicular or axillary adenopathy.  Skin:    General: Skin is warm and dry.     Findings: No rash.  Neurological:     General: No focal deficit present.     Mental Status: She is alert.  Psychiatric:        Mood and Affect: Mood normal.        Behavior: Behavior normal.     LABORATORY DATA:  CBC    Component Value Date/Time   WBC 10.0 10/19/2022 0904   WBC 7.6 12/23/2013 1000   WBC 12.2 (H) 02/05/2012 1915   RBC 4.97 10/19/2022 0904   RBC 4.9 12/23/2013 1000   RBC 4.97 02/05/2012 1915   HGB 14.1 10/19/2022 0904   HCT 44.3 10/19/2022 0904   PLT 293 10/19/2022 0904   MCV 89 10/19/2022 0904   MCH 28.4 10/19/2022 0904   MCH 26.9 (A) 12/23/2013 1000   MCH 28.0 02/05/2012 1915   MCHC 31.8 10/19/2022 0904   MCHC 32.1 12/23/2013 1000   MCHC 32.9 02/05/2012 1915   RDW 13.7 10/19/2022 0904   LYMPHSABS 3.1 10/19/2022 0904  MONOABS 0.7 02/05/2012 1915   EOSABS 0.6 (H) 10/19/2022 0904   BASOSABS 0.0 10/19/2022 0904    CMP      Component Value Date/Time   NA 141 10/19/2022 0904   K 4.1 10/19/2022 0904   CL 101 10/19/2022 0904   CO2 30 (H) 10/19/2022 0904   GLUCOSE 93 10/19/2022 0904   GLUCOSE 116 (H) 10/07/2020 1442   BUN 16 10/19/2022 0904   CREATININE 0.95 10/19/2022 0904   CALCIUM  10.2 10/19/2022 0904   PROT 6.6 10/19/2022 0904   ALBUMIN 3.9 10/19/2022 0904   AST 21 10/19/2022 0904   ALT 26 10/19/2022 0904   ALKPHOS 93 10/19/2022 0904   BILITOT 0.3 10/19/2022 0904   GFRNONAA >60 10/07/2020 1442   GFRAA 71 12/11/2019 0913     ASSESSMENT and THERAPY PLAN:   No problem-specific Assessment & Plan notes found for this encounter.     All questions were answered. The patient knows to call the clinic with any problems, questions or concerns. We can certainly see the patient much sooner if necessary.  Total encounter time:*** minutes*in face-to-face visit time, chart review, lab review, care coordination, order entry, and documentation of the encounter time.    Alwin Baars, NP 06/14/23 10:13 AM Medical Oncology and Hematology [facility] [address] [place], [place] [address] Tel. [phone]    Fax. [phone]  *Total Encounter Time as defined by the Centers for Medicare and Medicaid Services includes, in addition to the face-to-face time of a patient visit (documented in the note above) non-face-to-face time: obtaining and reviewing outside history, ordering and reviewing medications, tests or procedures, care coordination (communications with other health care professionals or caregivers) and documentation in the medical record.

## 2023-06-17 NOTE — Assessment & Plan Note (Signed)
 10/13/2020:Right lumpectomy: intermediate grade DCIS 0.6 cm with margins uninvolved by carcinoma.  ER 95%, PR 90%    Recommendation: 1. Followed by adjuvant radiation therapy (to be done at [facility]) completed 12/21/20 2. Followed by antiestrogen therapy with tamoxifen  5 years to be started 01/27/21 discontinued May 2023 because of profound vaginal discharge.  It resolved after she stopped it.     Laura James has no clinical or radiographic signs of breast cancer recurrence.  She continues on observation alone.  She is recommended to continue with annual mammograms.  We reviewed healthy diet and exercise today.  I recommended she continue to f/u with her PCP and with her preventive healthcare screenings.    RTC in 1 year for continued long-term f/u.

## 2023-06-19 ENCOUNTER — Ambulatory Visit (INDEPENDENT_AMBULATORY_CARE_PROVIDER_SITE_OTHER): Admitting: Family

## 2023-06-19 VITALS — BP 117/70 | HR 74 | Temp 97.3°F | Ht 65.0 in | Wt 198.0 lb

## 2023-06-19 DIAGNOSIS — I10 Essential (primary) hypertension: Secondary | ICD-10-CM

## 2023-06-19 DIAGNOSIS — K219 Gastro-esophageal reflux disease without esophagitis: Secondary | ICD-10-CM | POA: Diagnosis not present

## 2023-06-19 DIAGNOSIS — E785 Hyperlipidemia, unspecified: Secondary | ICD-10-CM

## 2023-06-19 DIAGNOSIS — M5430 Sciatica, unspecified side: Secondary | ICD-10-CM

## 2023-06-19 DIAGNOSIS — M549 Dorsalgia, unspecified: Secondary | ICD-10-CM | POA: Diagnosis not present

## 2023-06-19 DIAGNOSIS — R7309 Other abnormal glucose: Secondary | ICD-10-CM | POA: Diagnosis not present

## 2023-06-19 DIAGNOSIS — M150 Primary generalized (osteo)arthritis: Secondary | ICD-10-CM

## 2023-06-19 DIAGNOSIS — G4733 Obstructive sleep apnea (adult) (pediatric): Secondary | ICD-10-CM

## 2023-06-19 DIAGNOSIS — F411 Generalized anxiety disorder: Secondary | ICD-10-CM | POA: Insufficient documentation

## 2023-06-19 MED ORDER — BUSPIRONE HCL 5 MG PO TABS: 5.0000 mg | ORAL_TABLET | Freq: Three times a day (TID) | ORAL | 1 refills | Status: DC | PRN

## 2023-06-19 NOTE — Progress Notes (Signed)
 Subjective:    Patient ID: Laura James, female    DOB: 08-Oct-1950, 73 y.o.   MRN: [id number]  Chief Complaint  Patient presents with   Hypertension   Pt presents to the office today for chronic follow up.    PT is followed by Ortho as needed  for back pain and bursitis of hip.     She has hx right breast cancer and followed by Oncologists annually.   Followed by dermatologists every 6 months for skin checks and hx of basal cell.   She has OSA and uses CPAP twice a week.    She is having increased stressed with caring for her brother-in-law.  Hypertension This is a chronic problem. The current episode started more than 1 year ago. The problem has been resolved since onset. The problem is controlled. Associated symptoms include anxiety. Pertinent negatives include no malaise/fatigue, peripheral edema or shortness of breath (only when walking up hills). Risk factors for coronary artery disease include dyslipidemia, obesity and sedentary lifestyle. The current treatment provides moderate improvement.  Gastroesophageal Reflux She complains of belching and heartburn. This is a chronic problem. The current episode started more than 1 year ago. The problem occurs occasionally. The symptoms are aggravated by certain foods. She has tried a PPI for the symptoms. The treatment provided moderate relief.  Back Pain This is a chronic problem. The current episode started more than 1 year ago. The problem occurs intermittently. The problem has been waxing and waning since onset. The pain is present in the lumbar spine. The quality of the pain is described as aching. The pain is at a severity of 8/10 (with sweeping and mopping). The pain is mild. The symptoms are aggravated by twisting and bending. She has tried NSAIDs for the symptoms. The treatment provided moderate relief.  Arthritis Presents for follow-up visit. She complains of pain and stiffness. Affected locations include the left MCP, right MCP,  right knee, left knee, left hip and right hip. Her pain is at a severity of 8/10 (raining).  Hyperlipidemia This is a chronic problem. The current episode started more than 1 year ago. The problem is controlled. Recent lipid tests were reviewed and are normal. Exacerbating diseases include obesity. Pertinent negatives include no shortness of breath (only when walking up hills). Current antihyperlipidemic treatment includes statins. The current treatment provides moderate improvement of lipids. Risk factors for coronary artery disease include dyslipidemia, hypertension, a sedentary lifestyle and post-menopausal.  Anxiety Presents for follow-up visit. Symptoms include excessive worry, irritability, nervous/anxious behavior and restlessness. Patient reports no shortness of breath (only when walking up hills). Symptoms occur occasionally. The severity of symptoms is mild.        Review of Systems  Constitutional:  Positive for irritability. Negative for malaise/fatigue.  Respiratory:  Negative for shortness of breath (only when walking up hills).   Gastrointestinal:  Positive for heartburn.  Musculoskeletal:  Positive for back pain and stiffness.  Psychiatric/Behavioral:  The patient is nervous/anxious.   All other systems reviewed and are negative.  Family History  Problem Relation Age of Onset   Hypertension Mother    Arthritis Mother    Osteoporosis Mother    Kidney disease Mother        related to APAP use   Lung cancer Father    Hypertension Brother    Hypertension Daughter    Diabetes Daughter    Diabetes Son    Diabetes Other    Clotting disorder Other  Colon cancer Neg Hx    Social History   Socioeconomic History   Marital status: Married    Spouse name: Audrene Blessing   Number of children: 3   Years of education: 16   Highest education level: Master's degree (e.g., MA, MS, MEng, MEd, MSW, MBA)  Occupational History   Occupation: Hospice- Chaplain    Comment: [place]   Occupation: retired  Tobacco Use   Smoking status: Former    Current packs/day: 0.00    Average packs/day: 0.5 packs/day for 5.0 years (2.5 ttl pk-yrs)    Types: Cigarettes    Start date: 01/24/1963    Quit date: 01/24/1968    Years since quitting: 55.4   Smokeless tobacco: Never  Vaping Use   Vaping status: Never Used  Substance and Sexual Activity   Alcohol use: No    Alcohol/week: 0.0 standard drinks of alcohol   Drug use: No   Sexual activity: Yes  Other Topics Concern   Not on file  Social History Narrative   Lives at home with husband    Social Drivers of Corporate investment banker Strain: Low Risk  (06/19/2023)   Overall Financial Resource Strain (CARDIA)    Difficulty of Paying Living Expenses: Not hard at all  Food Insecurity: No Food Insecurity (06/19/2023)   Hunger Vital Sign    Worried About Running Out of Food in the Last Year: Never true    Ran Out of Food in the Last Year: Never true  Transportation Needs: No Transportation Needs (06/19/2023)   PRAPARE - Administrator, Civil Service (Medical): No    Lack of Transportation (Non-Medical): No  Physical Activity: Insufficiently Active (06/19/2023)   Exercise Vital Sign    Days of Exercise per Week: 2 days    Minutes of Exercise per Session: 10 min  Stress: Stress Concern Present (06/19/2023)   [employer] of Occupational Health - Occupational Stress Questionnaire    Feeling of Stress : Very much  Social Connections: Socially Integrated (06/19/2023)   Social Connection and Isolation Panel [NHANES]    Frequency of Communication with Friends and Family: More than three times a week    Frequency of Social Gatherings with Friends and Family: More than three times a week    Attends Religious Services: More than 4 times per year    Active Member of [employer] or Organizations: Yes    Attends Engineer, structural: More than 4 times per year    Marital Status: Married       Objective:    Physical Exam Vitals reviewed.  Constitutional:      General: She is not in acute distress.    Appearance: She is well-developed.  HENT:     Head: Normocephalic and atraumatic.     Right Ear: Tympanic membrane normal.     Left Ear: Tympanic membrane normal.  Eyes:     Pupils: Pupils are equal, round, and reactive to light.  Neck:     Thyroid : No thyromegaly.  Cardiovascular:     Rate and Rhythm: Normal rate and regular rhythm.     Heart sounds: Normal heart sounds. No murmur heard. Pulmonary:     Effort: Pulmonary effort is normal. No respiratory distress.     Breath sounds: Normal breath sounds. No wheezing.  Abdominal:     General: Bowel sounds are normal. There is no distension.     Palpations: Abdomen is soft.     Tenderness: There is no  abdominal tenderness.  Musculoskeletal:        General: No tenderness. Normal range of motion.     Cervical back: Normal range of motion and neck supple.  Skin:    General: Skin is warm and dry.  Neurological:     Mental Status: She is alert and oriented to person, place, and time.     Cranial Nerves: No cranial nerve deficit.     Deep Tendon Reflexes: Reflexes are normal and symmetric.  Psychiatric:        Behavior: Behavior normal.        Thought Content: Thought content normal.        Judgment: Judgment normal.       BP 117/70   Pulse 74   Temp (!) 97.3 F (36.3 C) (Temporal)   Ht 5\' 5"  (1.651 m)   Wt 198 lb (89.8 kg)   BMI 32.95 kg/m      Assessment & Plan:  Yentl Verge Engebretsen comes in today with chief complaint of Hypertension   Diagnosis and orders addressed:  1. Gastroesophageal reflux disease without esophagitis - CMP14+EGFR - CBC with Differential/Platelet  2. Essential hypertension, benign (Primary) - CMP14+EGFR - CBC with Differential/Platelet  3. Obstructive sleep apnea - CMP14+EGFR - CBC with Differential/Platelet  4. Primary osteoarthritis involving multiple joints - CMP14+EGFR - CBC with  Differential/Platelet  5. Hyperlipidemia, unspecified hyperlipidemia type - CMP14+EGFR - CBC with Differential/Platelet  6. Back pain with sciatica - CMP14+EGFR - CBC with Differential/Platelet  7. GAD (generalized anxiety disorder) Start Buspar 5 mg TID prn  Stress management Encouraged to start therapy - busPIRone (BUSPAR) 5 MG tablet; Take 1 tablet (5 mg total) by mouth 3 (three) times daily as needed.  Dispense: 90 tablet; Refill: 1   Labs pending Continue all medications Health Maintenance reviewed Diet and exercise encouraged  Follow up plan: 6 months    Tommas Fragmin, FNP

## 2023-06-19 NOTE — Patient Instructions (Signed)
 Managing Stress, Adult Feeling a certain amount of stress is normal. Stress helps our body and mind get ready to deal with the demands of life. Stress hormones can motivate you to do well at work and meet your responsibilities. But severe or long-term (chronic) stress can affect your mental and physical health. Chronic stress puts you at higher risk for: Anxiety and depression. Other health problems such as digestive problems, muscle aches, heart disease, high blood pressure, and stroke. What are the causes? Common causes of stress include: Demands from work, such as deadlines, feeling overworked, or having long hours. Pressures at home, such as money issues, disagreements with a spouse, or parenting issues. Pressures from major life changes, such as divorce, moving, loss of a loved one, or chronic illness. You may be at higher risk for stress-related problems if you: Do not get enough sleep. Are in poor health. Do not have emotional support. Have a mental health disorder such as anxiety or depression. How to recognize stress Stress can make you: Have trouble sleeping. Feel sad, anxious, irritable, or overwhelmed. Lose your appetite. Overeat or want to eat unhealthy foods. Want to use drugs or alcohol. Stress can also cause physical symptoms, such as: Sore, tense muscles, especially in the shoulders and neck. Headaches. Trouble breathing. A faster heart rate. Stomach pain, nausea, or vomiting. Diarrhea or constipation. Trouble concentrating. Follow these instructions at home: Eating and drinking Eat a healthy diet. This includes: Eating foods that are high in fiber, such as beans, whole grains, and fresh fruits and vegetables. Limiting foods that are high in fat and processed sugars, such as fried or sweet foods. Do not skip meals or overeat. Drink enough fluid to keep your urine pale yellow. Alcohol use Do not drink alcohol if: Your health care provider tells you not to  drink. You are pregnant, may be pregnant, or are planning to become pregnant. Drinking alcohol is a way some people try to ease their stress. This can be dangerous, so if you drink alcohol: Limit how much you have to: 0-1 drink a day for women. 0-2 drinks a day for men. Know how much alcohol is in your drink. In the [place] one drink equals one 12 oz bottle of beer (355 mL), one 5 oz glass of wine (148 mL), or one 1 oz glass of hard liquor (44 mL). Activity  Include 30 minutes of exercise in your daily schedule. Exercise is a good stress reducer. Include time in your day for an activity that you find relaxing. Try taking a walk, going on a bike ride, reading a book, or listening to music. Schedule your time in a way that lowers stress, and keep a regular schedule. Focus on doing what is most important to get done. Lifestyle Identify the source of your stress and your reaction to it. See a therapist who can help you change unhelpful reactions. When there are stressful events: Talk about them with family, friends, or coworkers. Try to think realistically about stressful events and not ignore them or overreact. Try to find the positives in a stressful situation and not focus on the negatives. Cut back on responsibilities at work and home, if possible. Ask for help from friends or family members if you need it. Find ways to manage stress, such as: Mindfulness, meditation, or deep breathing. Yoga or tai chi. Progressive muscle relaxation. Spending time in nature. Doing art, playing music, or reading. Making time for fun activities. Spending time with family and friends. Get support  from family, friends, or spiritual resources. General instructions Get enough sleep. Try to go to sleep and get up at about the same time every day. Take over-the-counter and prescription medicines only as told by your health care provider. Do not use any products that contain nicotine or tobacco. These products  include cigarettes, chewing tobacco, and vaping devices, such as e-cigarettes. If you need help quitting, ask your health care provider. Do not use drugs or smoke to deal with stress. Keep all follow-up visits. This is important. Where to find support Talk with your health care provider about stress management or finding a support group. Find a therapist to work with you on your stress management techniques. Where to find more information [employer] on Mental Illness: www.nami.org American Psychological Association: DiceTournament.ca Contact a health care provider if: Your stress symptoms get worse. You are unable to manage your stress at home. You are struggling to stop using drugs or alcohol. Get help right away if: You may be a danger to yourself or others. You have any thoughts of death or suicide. Get help right awayif you feel like you may hurt yourself or others, or have thoughts about taking your own life. Go to your nearest emergency room or: Call 911. Call the National Suicide Prevention Lifeline at (304)678-3299 or 988 in the U.[place].. This is open 24 hours a day. If you're a Veteran: Call 988 and press 1. This is open 24 hours a day. Text the [employer] at 226-132-3352. Summary Feeling a certain amount of stress is normal, but severe or long-term (chronic) stress can affect your mental and physical health. Chronic stress can put you at higher risk for anxiety, depression, and other health problems such as digestive problems, muscle aches, heart disease, high blood pressure, and stroke. You may be at higher risk for stress-related problems if you do not get enough sleep, are in poor health, lack emotional support, or have a mental health disorder such as anxiety or depression. Identify the source of your stress and your reaction to it. Try talking about stressful events with family, friends, or coworkers, finding a coping method, or getting support from spiritual resources. If  you need more help, talk with your health care provider about finding a support group or a mental health therapist. This information is not intended to replace advice given to you by your health care provider. Make sure you discuss any questions you have with your health care provider. Document Revised: 08/24/2022 Document Reviewed: 08/03/2020 Elsevier Patient Education  2024 [employer].

## 2023-06-20 LAB — CBC WITH DIFFERENTIAL/PLATELET
Basophils Absolute: 0 10*3/uL (ref 0.0–0.2)
Basos: 0 %
EOS (ABSOLUTE): 0.5 10*3/uL — ABNORMAL HIGH (ref 0.0–0.4)
Eos: 6 %
Hematocrit: 41 % (ref 34.0–46.6)
Hemoglobin: 13.4 g/dL (ref 11.1–15.9)
Immature Grans (Abs): 0 10*3/uL (ref 0.0–0.1)
Immature Granulocytes: 0 %
Lymphocytes Absolute: 1.9 10*3/uL (ref 0.7–3.1)
Lymphs: 25 %
MCH: 28.3 pg (ref 26.6–33.0)
MCHC: 32.7 g/dL (ref 31.5–35.7)
MCV: 87 fL (ref 79–97)
Monocytes Absolute: 0.4 10*3/uL (ref 0.1–0.9)
Monocytes: 5 %
Neutrophils Absolute: 4.8 10*3/uL (ref 1.4–7.0)
Neutrophils: 64 %
Platelets: 292 10*3/uL (ref 150–450)
RBC: 4.73 x10E6/uL (ref 3.77–5.28)
RDW: 13.8 % (ref 11.7–15.4)
WBC: 7.6 10*3/uL (ref 3.4–10.8)

## 2023-06-20 LAB — CMP14+EGFR
ALT: 18 IU/L (ref 0–32)
AST: 25 IU/L (ref 0–40)
Albumin: 4 g/dL (ref 3.8–4.8)
Alkaline Phosphatase: 102 IU/L (ref 44–121)
BUN/Creatinine Ratio: 13 (ref 12–28)
BUN: 12 mg/dL (ref 8–27)
Bilirubin Total: 0.3 mg/dL (ref 0.0–1.2)
CO2: 22 mmol/L (ref 20–29)
Calcium: 9.6 mg/dL (ref 8.7–10.3)
Chloride: 103 mmol/L (ref 96–106)
Creatinine, Ser: 0.96 mg/dL (ref 0.57–1.00)
Globulin, Total: 2.4 g/dL (ref 1.5–4.5)
Glucose: 135 mg/dL — ABNORMAL HIGH (ref 70–99)
Potassium: 3.8 mmol/L (ref 3.5–5.2)
Sodium: 140 mmol/L (ref 134–144)
Total Protein: 6.4 g/dL (ref 6.0–8.5)
eGFR: 62 mL/min/{1.73_m2} (ref >59)

## 2023-06-21 ENCOUNTER — Ambulatory Visit: Payer: Self-pay | Admitting: Family

## 2023-06-22 LAB — HGB A1C W/O EAG: Hgb A1c MFr Bld: 6.5 % — ABNORMAL HIGH (ref 4.8–5.6)

## 2023-06-22 LAB — SPECIMEN STATUS REPORT

## 2023-06-25 ENCOUNTER — Other Ambulatory Visit: Payer: Self-pay | Admitting: Family

## 2023-06-25 DIAGNOSIS — E1169 Type 2 diabetes mellitus with other specified complication: Secondary | ICD-10-CM

## 2023-06-25 MED ORDER — LANCETS MISC. MISC: 1.0000 | Freq: Three times a day (TID) | 0 refills | Status: AC

## 2023-06-25 MED ORDER — LANCET DEVICE MISC: 1.0000 | Freq: Three times a day (TID) | 0 refills | Status: AC

## 2023-06-25 MED ORDER — BLOOD GLUCOSE TEST VI STRP
1.0000 | ORAL_STRIP | Freq: Three times a day (TID) | 0 refills | Status: DC
Start: 2023-06-25 — End: 2023-07-24

## 2023-06-25 MED ORDER — BLOOD GLUCOSE MONITORING SUPPL DEVI: 1.0000 | Freq: Three times a day (TID) | 0 refills | Status: AC

## 2023-06-27 ENCOUNTER — Encounter: Payer: Self-pay | Admitting: Family Medicine

## 2023-06-27 ENCOUNTER — Telehealth: Payer: Self-pay

## 2023-06-27 ENCOUNTER — Ambulatory Visit (INDEPENDENT_AMBULATORY_CARE_PROVIDER_SITE_OTHER)

## 2023-06-27 VITALS — BP 112/74 | HR 60 | Temp 98.0°F | Ht 65.0 in | Wt 196.0 lb

## 2023-06-27 DIAGNOSIS — R21 Rash and other nonspecific skin eruption: Secondary | ICD-10-CM | POA: Diagnosis not present

## 2023-06-27 MED ORDER — TRIAMCINOLONE ACETONIDE 0.025 % EX OINT: 1.0000 | TOPICAL_OINTMENT | Freq: Two times a day (BID) | CUTANEOUS | 0 refills | Status: DC

## 2023-06-27 NOTE — Progress Notes (Signed)
 Subjective:  Patient ID: Laura James, female    DOB: 03-24-1950, 73 y.o.   MRN: [id number]  Patient Care Team: Yevette Hem, FNP as PCP - General (Family Medicine) Cipriano Creeks Magda Schneider, MD as Consulting Physician (Radiation Oncology) Cameron Cea, MD as Consulting Physician (Hematology and Oncology) Elois Hair, MD as Consulting Physician (Gastroenterology) Lind Repine, MD as Consulting Physician (Pulmonary Disease) Enid Harry, MD as Consulting Physician (General Surgery) [facility] Aggie Horton, MD as Referring Physician (Dermatology)   Chief Complaint:  Rash  HPI: Laura James is a 73 y.o. female presenting on 06/27/2023 for Rash  Rash   States that she has red, raised bumps that come and go. States that they started 7 days ago. States that they are random, sometimes at night. Reports that they are itching. She is trying poison ivy scrub, benadryl  cream, and benadryl  tablet.  Denies new soaps, lotions, detergents. States the only thing that is new was cinnamon capsule that she tried once last time.   Relevant past medical, surgical, family, and social history reviewed and updated as indicated.  Allergies and medications reviewed and updated. Data reviewed: Chart in Epic.   Past Medical History:  Diagnosis Date   Barrett esophagus    Benign neoplasm of other and unspecified site of the digestive system 04/29/2007   Breast cancer (HCC) 10/13/2020   Chronic bronchitis (HCC)    Depression    Diverticulosis of colon (without mention of hemorrhage)    Esophageal reflux    Essential hypertension, benign    Gastritis    Grave's disease    Hiatal hernia    Hypothyroidism    IBS (irritable bowel syndrome)    Obesity    Osteopenia    Personal history of radiation therapy    Sleep apnea    Symptomatic menopausal or female climacteric states    Past Surgical History:  Procedure Laterality Date   ABDOMINAL HYSTERECTOMY N/A     BREAST BIOPSY Right 09/22/2020   BREAST LUMPECTOMY Right 10/13/2020   BREAST LUMPECTOMY WITH RADIOACTIVE SEED LOCALIZATION Right 10/13/2020   Procedure: RIGHT BREAST LUMPECTOMY WITH RADIOACTIVE SEED LOCALIZATION;  Surgeon: Enid Harry, MD;  Location: [facility];  Service: General;  Laterality: Right;   CHOLECYSTECTOMY     INCONTINENCE SURGERY     MOHS SURGERY     TONSILLECTOMY     Social History   Socioeconomic History   Marital status: Married    Spouse name: Roger   Number of children: 3   Years of education: 16   Highest education level: Master's degree (e.g., MA, MS, MEng, MEd, MSW, MBA)  Occupational History   Occupation: Hospice- Chaplain    Comment: [place]   Occupation: retired  Tobacco Use   Smoking status: Former    Current packs/day: 0.00    Average packs/day: 0.5 packs/day for 5.0 years (2.5 ttl pk-yrs)    Types: Cigarettes    Start date: 01/24/1963    Quit date: 01/24/1968    Years since quitting: 55.4   Smokeless tobacco: Never  Vaping Use   Vaping status: Never Used  Substance and Sexual Activity   Alcohol use: No    Alcohol/week: 0.0 standard drinks of alcohol   Drug use: No   Sexual activity: Yes  Other Topics Concern   Not on file  Social History Narrative   Lives at home with husband    Social Drivers of Corporate investment banker  Strain: Low Risk  (06/19/2023)   Overall Financial Resource Strain (CARDIA)    Difficulty of Paying Living Expenses: Not hard at all  Food Insecurity: No Food Insecurity (06/19/2023)   Hunger Vital Sign    Worried About Running Out of Food in the Last Year: Never true    Ran Out of Food in the Last Year: Never true  Transportation Needs: No Transportation Needs (06/19/2023)   PRAPARE - Administrator, Civil Service (Medical): No    Lack of Transportation (Non-Medical): No  Physical Activity: Insufficiently Active (06/19/2023)   Exercise Vital Sign    Days of Exercise per Week: 2  days    Minutes of Exercise per Session: 10 min  Stress: Stress Concern Present (06/19/2023)   [employer] of Occupational Health - Occupational Stress Questionnaire    Feeling of Stress : Very much  Social Connections: Socially Integrated (06/19/2023)   Social Connection and Isolation Panel [NHANES]    Frequency of Communication with Friends and Family: More than three times a week    Frequency of Social Gatherings with Friends and Family: More than three times a week    Attends Religious Services: More than 4 times per year    Active Member of [employer] or Organizations: Yes    Attends Banker Meetings: More than 4 times per year    Marital Status: Married  Catering manager Violence: Not At Risk (05/01/2023)   Humiliation, Afraid, Rape, and Kick questionnaire    Fear of Current or Ex-Partner: No    Emotionally Abused: No    Physically Abused: No    Sexually Abused: No    Outpatient Encounter Medications as of 06/27/2023  Medication Sig   albuterol  (PROAIR  HFA) 108 (90 Base) MCG/ACT inhaler INHALE 2 PUFFS BY MOUTH EVERY 6 HOURS AS NEEDED FOR WHEEZE OR SHORTNESS OF BREATH   aspirin 81 MG tablet Take 81 mg by mouth daily.   atenolol  (TENORMIN ) 25 MG tablet Take 1 tablet (25 mg total) by mouth daily.   atorvastatin  (LIPITOR) 20 MG tablet Take 1 tablet (20 mg total) by mouth daily.   Blood Glucose Monitoring Suppl DEVI 1 each by Does not apply route in the morning, at noon, and at bedtime. May substitute to any manufacturer covered by patient's insurance.   busPIRone  (BUSPAR ) 5 MG tablet Take 1 tablet (5 mg total) by mouth 3 (three) times daily as needed.   celecoxib  (CELEBREX ) 200 MG capsule TAKE 1 CAPSULE (200 MG TOTAL) BY MOUTH 2 (TWO) TIMES DAILY. **NEEDS TO BE SEEN BEFORE NEXT REFILL**   cetirizine (ZYRTEC) 10 MG tablet Take 10 mg by mouth daily as needed.    cholecalciferol (VITAMIN D ) 1000 units tablet Take 1 tablet (1,000 Units total) by mouth daily.   Cyanocobalamin  (B-12) 3000 MCG CAPS Take 1 capsule by mouth daily.   docusate sodium (COLACE) 100 MG capsule Take 200 mg by mouth 2 (two) times daily.   fluticasone  (FLONASE ) 50 MCG/ACT nasal spray SPRAY 2 SPRAYS INTO EACH NOSTRIL EVERY DAY   Glucose Blood (BLOOD GLUCOSE TEST STRIPS) STRP 1 each by In Vitro route in the morning, at noon, and at bedtime. May substitute to any manufacturer covered by patient's insurance.   hydrochlorothiazide  (HYDRODIURIL ) 25 MG tablet TAKE 1 TABLET (25 MG TOTAL) BY MOUTH DAILY.   Lancet Device MISC 1 each by Does not apply route in the morning, at noon, and at bedtime. May substitute to any manufacturer covered by patient's insurance.  Lancets Misc. MISC 1 each by Does not apply route in the morning, at noon, and at bedtime. May substitute to any manufacturer covered by patient's insurance.   methocarbamol  (ROBAXIN ) 500 MG tablet Take 1 tablet (500 mg total) by mouth every 6 (six) hours as needed for muscle spasms.   omeprazole  (PRILOSEC OTC) 20 MG tablet Take 20 mg by mouth daily. Pt takes 1/2 tablet in the morning and 1/2 tablet in the evening   No facility-administered encounter medications on file as of 06/27/2023.    Allergies  Allergen Reactions   Adhesive [Tape] Itching and Rash   Latex Rash   Review of Systems  Skin:  Positive for rash.   As per HPI  Objective:  BP 112/74   Pulse 60   Temp 98 F (36.7 C)   Ht 5\' 5"  (1.651 m)   Wt 196 lb (88.9 kg)   SpO2 94%   BMI 32.62 kg/m    Wt Readings from Last 3 Encounters:  06/27/23 196 lb (88.9 kg)  06/19/23 198 lb (89.8 kg)  06/14/23 197 lb 3.2 oz (89.4 kg)    Physical Exam Constitutional:      General: She is awake. She is not in acute distress.    Appearance: Normal appearance. She is well-developed and well-groomed. She is obese. She is not ill-appearing, toxic-appearing or diaphoretic.  Cardiovascular:     Rate and Rhythm: Normal rate and regular rhythm.     Pulses: Normal pulses.          Radial  pulses are 2+ on the right side and 2+ on the left side.       Posterior tibial pulses are 2+ on the right side and 2+ on the left side.     Heart sounds: Normal heart sounds. No murmur heard.    No gallop.  Pulmonary:     Effort: Pulmonary effort is normal. No respiratory distress.     Breath sounds: Normal breath sounds. No stridor. No wheezing, rhonchi or rales.  Musculoskeletal:     Cervical back: Full passive range of motion without pain and neck supple.     Right lower leg: No edema.     Left lower leg: No edema.  Skin:    General: Skin is warm.     Capillary Refill: Capillary refill takes less than 2 seconds.     Findings: Lesion and rash present. Rash is urticarial.          Comments: Small, urticarial, erythematous lesions diffuse and isolated across arms, chest, abdomen, and back   Neurological:     General: No focal deficit present.     Mental Status: She is alert, oriented to person, place, and time and easily aroused. Mental status is at baseline.     GCS: GCS eye subscore is 4. GCS verbal subscore is 5. GCS motor subscore is 6.     Motor: No weakness.  Psychiatric:        Attention and Perception: Attention and perception normal.        Mood and Affect: Mood and affect normal.        Speech: Speech normal.        Behavior: Behavior normal. Behavior is cooperative.        Thought Content: Thought content normal. Thought content does not include homicidal or suicidal ideation. Thought content does not include homicidal or suicidal plan.        Cognition and Memory: Cognition and memory normal.  Judgment: Judgment normal.     Results for orders placed or performed in visit on 06/19/23  CMP14+EGFR   Collection Time: 06/19/23 10:35 AM  Result Value Ref Range   Glucose 135 (H) 70 - 99 mg/dL   BUN 12 8 - 27 mg/dL   Creatinine, Ser 4.09 0.57 - 1.00 mg/dL   eGFR 62 >81 XB/JYN/8.29   BUN/Creatinine Ratio 13 12 - 28   Sodium 140 134 - 144 mmol/L   Potassium 3.8  3.5 - 5.2 mmol/L   Chloride 103 96 - 106 mmol/L   CO2 22 20 - 29 mmol/L   Calcium  9.6 8.7 - 10.3 mg/dL   Total Protein 6.4 6.0 - 8.5 g/dL   Albumin 4.0 3.8 - 4.8 g/dL   Globulin, Total 2.4 1.5 - 4.5 g/dL   Bilirubin Total 0.3 0.0 - 1.2 mg/dL   Alkaline Phosphatase 102 44 - 121 IU/L   AST 25 0 - 40 IU/L   ALT 18 0 - 32 IU/L  CBC with Differential/Platelet   Collection Time: 06/19/23 10:35 AM  Result Value Ref Range   WBC 7.6 3.4 - 10.8 x10E3/uL   RBC 4.73 3.77 - 5.28 x10E6/uL   Hemoglobin 13.4 11.1 - 15.9 g/dL   Hematocrit 56.2 13.0 - 46.6 %   MCV 87 79 - 97 fL   MCH 28.3 26.6 - 33.0 pg   MCHC 32.7 31.5 - 35.7 g/dL   RDW 86.5 78.4 - 69.6 %   Platelets 292 150 - 450 x10E3/uL   Neutrophils 64 Not Estab. %   Lymphs 25 Not Estab. %   Monocytes 5 Not Estab. %   Eos 6 Not Estab. %   Basos 0 Not Estab. %   Neutrophils Absolute 4.8 1.4 - 7.0 x10E3/uL   Lymphocytes Absolute 1.9 0.7 - 3.1 x10E3/uL   Monocytes Absolute 0.4 0.1 - 0.9 x10E3/uL   EOS (ABSOLUTE) 0.5 (H) 0.0 - 0.4 x10E3/uL   Basophils Absolute 0.0 0.0 - 0.2 x10E3/uL   Immature Granulocytes 0 Not Estab. %   Immature Grans (Abs) 0.0 0.0 - 0.1 x10E3/uL  Hgb A1c w/o eAG   Collection Time: 06/19/23 10:35 AM  Result Value Ref Range   Hgb A1c MFr Bld 6.5 (H) 4.8 - 5.6 %  Specimen status report   Collection Time: 06/19/23 10:35 AM  Result Value Ref Range   specimen status report Comment        06/27/2023    8:01 AM 06/19/2023   10:42 AM 05/01/2023    9:08 AM 10/19/2022    8:33 AM 10/13/2022    2:41 PM  Depression screen PHQ 2/9  Decreased Interest 0 0 0 0 0  Down, Depressed, Hopeless 0 1 0 0 0  PHQ - 2 Score 0 1 0 0 0  Altered sleeping 1 1  0 0  Tired, decreased energy 0 1  0 0  Change in appetite 0 0  0 0  Feeling bad or failure about yourself  0 0  0 0  Trouble concentrating 0 0  0 0  Moving slowly or fidgety/restless 0 0  0 0  Suicidal thoughts 0 0  0 0  PHQ-9 Score 1 3  0 0  Difficult doing work/chores  Somewhat  difficult  Not difficult at all Not difficult at all       06/19/2023   10:43 AM 10/13/2022    2:41 PM 12/30/2021   11:02 AM 06/13/2021   10:36 AM  GAD 7 : Generalized  Anxiety Score  Nervous, Anxious, on Edge 1 0 0 0  Control/stop worrying 0 0 0 0  Worry too much - different things 0 0 0 0  Trouble relaxing 1 0 0 0  Restless 0 0 0 0  Easily annoyed or irritable 0 0 0 0  Afraid - awful might happen 0 0 0 0  Total GAD 7 Score 2 0 0 0  Anxiety Difficulty Not difficult at all Not difficult at all Not difficult at all Not difficult at all    Pertinent labs & imaging results that were available during my care of the patient were reviewed by me and considered in my medical decision making.  Assessment & Plan:  Inella was seen today for rash.  Diagnoses and all orders for this visit:  Rash Discussed likely allergic/irritant dermatitis. Will start medication as below for patient rash. Discussed started zyrtec and pepcid  OTC as well. Follow up in 2 weeks if symptoms not improved.  -     triamcinolone  (KENALOG ) 0.025 % ointment; Apply 1 Application topically 2 (two) times daily.     Continue all other maintenance medications.  Follow up plan: Return if symptoms worsen or fail to improve.   Continue healthy lifestyle choices, including diet (rich in fruits, vegetables, and lean proteins, and low in salt and simple carbohydrates) and exercise (at least 30 minutes of moderate physical activity daily).  Written and verbal instructions provided   The above assessment and management plan was discussed with the patient. The patient verbalized understanding of and has agreed to the management plan. Patient is aware to call the clinic if they develop any new symptoms or if symptoms persist or worsen. Patient is aware when to return to the clinic for a follow-up visit. Patient educated on when it is appropriate to go to the emergency department.   Jacqualyn Mates, DNP-FNP [facility] [address] [place], [place] [address] [phone]

## 2023-06-27 NOTE — Progress Notes (Signed)
 Care Guide Pharmacy Note  06/27/2023 Name: Laura James MRN: [id number] DOB: Apr 09, 1950  Referred By: Yevette Hem, FNP Reason for referral: Complex Care Management (Outreach to schedule with Pharm d )   Laura James is a 73 y.o. year old female who is a primary care patient of Yevette Hem, FNP.  [name] was referred to the pharmacist for assistance related to: DMII  Successful contact was made with the patient to discuss pharmacy services including being ready for the pharmacist to call at least 5 minutes before the scheduled appointment time and to have medication bottles and any blood pressure readings ready for review. The patient agreed to meet with the pharmacist via telephone visit on (date/time).07/18/2023  Lenton Rail , RMA     [place]  [facility], [facility] Guide  Direct Dial: [phone]  Website: [email or web address]

## 2023-06-27 NOTE — Patient Instructions (Signed)
 Pepcid  in morning before food (about 30 minutes before food) Zyrtec at night  Triamcinolone  on the site twice daily for two weeks

## 2023-07-12 ENCOUNTER — Other Ambulatory Visit: Payer: Self-pay | Admitting: Family

## 2023-07-12 DIAGNOSIS — F411 Generalized anxiety disorder: Secondary | ICD-10-CM

## 2023-07-16 ENCOUNTER — Ambulatory Visit: Payer: Self-pay

## 2023-07-16 NOTE — Telephone Encounter (Signed)
   FYI Only or Action Required?: FYI only for provider.  Patient was last seen in primary care on 06/27/2023 by Cathlene Marry Lenis, FNP. Called Nurse Triage reporting Hip Pain. Symptoms began several weeks but worse today. Interventions attempted: Prescription medications: Celecoxib  and Rest, hydration, or home remedies. Symptoms are: gradually worsening.  Triage Disposition: See PCP When Office is Open (Within 3 Days)  Patient/caregiver understands and will follow disposition?: Yes       Appointment made for 07/17/2023 at 11:15 AM with Nena Deitra Morton Sebastian NP                 Copied from CRM [id number]. Topic: Clinical - Red Word Triage >> Jul 16, 2023  3:18 PM Antwanette L wrote: Red Word that prompted transfer to Nurse Triage: patient is experiencing severe hip pain Reason for Disposition  [1] MODERATE pain (e.g., interferes with normal activities, limping) AND [2] present > 3 days  Answer Assessment - Initial Assessment Questions 1. LOCATION and RADIATION: Where is the pain located?      Right Hip 2. QUALITY: What does the pain feel like?  (e.g., sharp, dull, aching, burning)     Sharp 3. SEVERITY: How bad is the pain? What does it keep you from doing?   (Scale 1-10; or mild, moderate, severe)   -  MILD (1-3): doesn't interfere with normal activities    -  MODERATE (4-7): interferes with normal activities (e.g., work or school) or awakens from sleep, limping    -  SEVERE (8-10): excruciating pain, unable to do any normal activities, unable to walk     5 at rest and 9 when standing on it 4. ONSET: When did the pain start? Does it come and go, or is it there all the time?     For several weeks but this morning patient states she could barely put any pressure on it--able to walk on it though 5. WORK OR EXERCISE: Has there been any recent work or exercise that involved this part of the body?      No 6. CAUSE: What do you think is causing the hip  pain?      N/a 7. AGGRAVATING FACTORS: What makes the hip pain worse? (e.g., walking, climbing stairs, running)     Walking on this hip 8. OTHER SYMPTOMS: Do you have any other symptoms? (e.g., back pain, pain shooting down leg,  fever, rash)     No    Patient states that she has been taking celecoxib  that she was previously prescribed for osteoarthritis with no relief. Patient is able to walk around and denies any known injuries. Patient given Care Advice as per protocol and is also advised that if anything gets worse to go to the Emergency Room or call 911. Patient verbalized understanding.  Protocols used: Hip Pain-A-AH

## 2023-07-17 ENCOUNTER — Ambulatory Visit (INDEPENDENT_AMBULATORY_CARE_PROVIDER_SITE_OTHER): Admitting: Nurse Practitioner

## 2023-07-17 ENCOUNTER — Encounter: Payer: Self-pay | Admitting: Nurse Practitioner

## 2023-07-17 VITALS — BP 117/70 | HR 61 | Temp 97.8°F | Ht 65.0 in | Wt 196.0 lb

## 2023-07-17 DIAGNOSIS — G8929 Other chronic pain: Secondary | ICD-10-CM | POA: Insufficient documentation

## 2023-07-17 DIAGNOSIS — M25552 Pain in left hip: Secondary | ICD-10-CM | POA: Diagnosis not present

## 2023-07-17 DIAGNOSIS — M150 Primary generalized (osteo)arthritis: Secondary | ICD-10-CM | POA: Diagnosis not present

## 2023-07-17 MED ORDER — DICLOFENAC SODIUM 1 % EX GEL
2.0000 g | Freq: Four times a day (QID) | CUTANEOUS | 0 refills | Status: AC
Start: 2023-07-17 — End: ?

## 2023-07-17 MED ORDER — PREDNISONE 10 MG PO TABS
10.0000 mg | ORAL_TABLET | Freq: Two times a day (BID) | ORAL | 0 refills | Status: DC
Start: 2023-07-17 — End: 2023-10-23

## 2023-07-17 NOTE — Progress Notes (Signed)
 Acute Office Visit  Subjective:     Patient ID: Laura James, female    DOB: 07/30/1950, 73 y.o.   MRN: [id number]  Chief Complaint  Patient presents with   Hip Pain    Right hip pain got worse yesterday to the point she could not walk    HPI Laura James is a 73 yrs old female presents 06/25/2023 for concerns for right hip pain PMH history of arthritis Hip Pain: Patient complains of right hip pain. Onset of the symptoms was yesterday   I always have pain, woke yesterday morning, I could hardly walk Inciting event: none and woke with it. Current symptoms include is worse with weight bearing, is aggravated by walking, and radiates to knee. Associated symptoms: popping sensation. Aggravating symptoms: any weight bearing and going up and down stairs. Patient's overall course: You have been having hip issue PEB should get worse yesterday, course of pain: began worsening 1 days ago, and course of instability: gradually worsening. Patient has had prior hip problems.  Saw ortho in the past fro injectionPrevious visits for this problem: yes, last seen 1 yr ago by the patient's PCP. Evaluation to date: plain films, which were significant hip arthritis. Bone anatomy is normal femoral neck are intact.  Treatment to date: Celebrex , Ortho for injection.  Active Ambulatory Problems    Diagnosis Date Noted   IBS (irritable bowel syndrome) 08/25/2010   GERD (gastroesophageal reflux disease) 08/25/2010   Diverticulosis of colon 12/30/2010   Gastritis, chronic 12/30/2010   Essential hypertension, benign    Hiatal hernia    Obstructive sleep apnea 12/23/2013   Osteopenia 09/06/2016   Osteoarthritis 10/12/2016   Hyperlipidemia 08/08/2018   Ductal carcinoma in situ (DCIS) of right breast 10/26/2020   Back pain with sciatica 11/16/2020   History of right breast cancer 04/18/2022   Trochanteric bursitis, left hip 08/03/2022   Basal cell carcinoma 10/19/2022   GAD (generalized anxiety disorder)  06/19/2023   Chronic left hip pain 07/17/2023   Resolved Ambulatory Problems    Diagnosis Date Noted   Abdominal pain 08/25/2010   Diarrhea, functional 08/25/2010   Fatigue 07/08/2014   Upper airway cough syndrome 12/12/2014   RLS (restless legs syndrome) 04/07/2019   Past Medical History:  Diagnosis Date   Barrett esophagus    Benign neoplasm of other and unspecified site of the digestive system 04/29/2007   Breast cancer (HCC) 10/13/2020   Chronic bronchitis (HCC)    Depression    Diverticulosis of colon (without mention of hemorrhage)    Esophageal reflux    Gastritis    Grave's disease    Hypothyroidism    Obesity    Personal history of radiation therapy    Sleep apnea    Symptomatic menopausal or female climacteric states     Review of Systems  Constitutional:  Negative for chills and fever.  HENT:  Negative for sinus pain and sore throat.   Cardiovascular:  Negative for chest pain and leg swelling.  Gastrointestinal:  Negative for abdominal pain, diarrhea, nausea and vomiting.  Musculoskeletal:  Positive for joint pain.       Left hip  Skin:  Negative for itching and rash.  Neurological:  Negative for dizziness and headaches.   Negative unless indicated in HPI    Objective:    BP 117/70   Pulse 61   Temp 97.8 F (36.6 C) (Temporal)   Ht 5' 5 (1.651 m)   Wt 196 lb (88.9 kg)  SpO2 97%   BMI 32.62 kg/m  BP Readings from Last 3 Encounters:  07/17/23 117/70  06/27/23 112/74  06/19/23 117/70   Wt Readings from Last 3 Encounters:  07/17/23 196 lb (88.9 kg)  06/27/23 196 lb (88.9 kg)  06/19/23 198 lb (89.8 kg)      Physical Exam Vitals and nursing note reviewed.  Constitutional:      General: She is not in acute distress. HENT:     Head: Normocephalic and atraumatic.     Nose: Nose normal.     Mouth/Throat:     Mouth: Mucous membranes are moist.   Eyes:     General: No scleral icterus.    Conjunctiva/sclera: Conjunctivae normal.     Pupils:  Pupils are equal, round, and reactive to light.   Pulmonary:     Effort: Pulmonary effort is normal.     Breath sounds: Normal breath sounds.   Musculoskeletal:     Left hip: Tenderness and bony tenderness present. Decreased range of motion.     Comments: At the greater trochanter   Neurological:     Mental Status: She is alert and oriented to person, place, and time.   Psychiatric:        Mood and Affect: Mood normal.        Behavior: Behavior normal.        Thought Content: Thought content normal.        Judgment: Judgment normal.     No results found for any visits on 07/17/23.      Assessment & Plan:  Chronic left hip pain -     Ambulatory referral to Orthopedics -     predniSONE ; Take 1 tablet (10 mg total) by mouth 2 (two) times daily with a meal.  Dispense: 10 tablet; Refill: 0  Primary osteoarthritis involving multiple joints -     Diclofenac  Sodium; Apply 2 g topically 4 (four) times daily.  Dispense: 150 g; Refill: 0  Laura James is a 73 year old African-American female seen today for left hip pain, no acute distress Left hip pain: Referral to Ortho for possible injection [name] Prednisone  10 mg twice daily for 5 days, and Voltaren  gel 4 times daily as needed She is also on Celebrex  she is instructed not to take both at the same time.   The above assessment and management plan was discussed with the patient. The patient verbalized understanding of and has agreed to the management plan. Patient is aware to call the clinic if they develop any new symptoms or if symptoms persist or worsen. Patient is aware when to return to the clinic for a follow-up visit. Patient educated on when it is appropriate to go to the emergency department.  Return for follow-up.  Laura Basista [place] Thompson, DNP [facility] [address] [place], [place] [address] [phone]  Note: This document was prepared by [employer] voice dictation technology and any errors that results from this process are unintentional.

## 2023-07-18 ENCOUNTER — Other Ambulatory Visit (INDEPENDENT_AMBULATORY_CARE_PROVIDER_SITE_OTHER): Admitting: Pharmacist

## 2023-07-18 DIAGNOSIS — E119 Type 2 diabetes mellitus without complications: Secondary | ICD-10-CM

## 2023-07-18 NOTE — Progress Notes (Signed)
 07/18/2023 Name: Laura James MRN: [id number] DOB: March 14, 1950  Chief Complaint  Patient presents with   Diabetes    Laura James is a 73 y.o. year old female who presented for a telephone visit.   They were referred to the pharmacist by their PCP for assistance in managing diabetes. Patient is a newly diagnosed   Subjective:  Care Team: Primary Care Provider: Lavell Bari LABOR, FNP ; Next Scheduled Visit: 08/2023   Medication Access/Adherence  Current Pharmacy:  [facility] [phone] - [place], [place] - [address] [place] [address] Phone: [phone] Fax: [phone]   Patient reports affordability concerns with their medications: No  Patient reports access/transportation concerns to their pharmacy: No  Patient reports adherence concerns with their medications:  No     Diabetes:  Current medications: n/a Medications tried in the past: n/a  Current glucose readings: FBG 100-114, one AM it was higher BGM was ordered by PCP at diagnosis Testing fasting daily Has checked once after meals and it was 156  Current meal patterns: eats about two meals a day bc breakfast is large - Breakfast: farm raised egg w/ cheese, cantaloupe slice, honey baked [place] slice Some biscuits, toast (limits bread) - Supper farm raised beef, protein, green beans, veggies - Drinks once a week sprite (rarely), water, unsweetened tea w/ stevia, zero sugar gold leaf tea  -enjoys fresh fruits -limit 45-50 carbs per meal (3 meals a day)  Current physical activity: encouraged increase activity as able  Current medication access support: n/a   Objective:  Lab Results  Component Value Date   HGBA1C 6.5 (H) 06/19/2023    Lab Results  Component Value Date   CREATININE 0.96 06/19/2023   BUN 12 06/19/2023   NA 140 06/19/2023   K 3.8 06/19/2023   CL 103 06/19/2023   CO2 22 06/19/2023    Lab Results  Component Value Date   CHOL 136 10/19/2022   HDL 45  10/19/2022   LDLCALC 69 10/19/2022   TRIG 121 10/19/2022   CHOLHDL 3.0 10/19/2022    Medications Reviewed Today     Reviewed by Billee Mliss BIRCH, [facility] (Pharmacist) on 07/18/23 at 1031  Med List Status: <None>   Medication Order Taking? Sig Documenting Provider Last Dose Status Informant  albuterol  (PROAIR  HFA) 108 (90 Base) MCG/ACT inhaler [id number]  INHALE 2 PUFFS BY MOUTH EVERY 6 HOURS AS NEEDED FOR WHEEZE OR SHORTNESS OF BREATH Joesph Annabella HERO, FNP  Active   aspirin 81 MG tablet [id number]  Take 81 mg by mouth daily. [provider]  Active Self  atenolol  (TENORMIN ) 25 MG tablet [id number]  Take 1 tablet (25 mg total) by mouth daily. Lavell Bari A, FNP  Active   atorvastatin  (LIPITOR) 20 MG tablet [id number]  Take 1 tablet (20 mg total) by mouth daily. Lavell Bari LABOR, FNP  Active   Blood Glucose Monitoring Suppl DEVI [id number]  1 each by Does not apply route in the morning, at noon, and at bedtime. May substitute to any manufacturer covered by patient's insurance. Lavell Bari A, FNP  Active   busPIRone  (BUSPAR ) 5 MG tablet [id number]  TAKE 1 TABLET BY MOUTH 3 TIMES DAILY AS NEEDED. Lavell Bari A, FNP  Active   celecoxib  (CELEBREX ) 200 MG capsule [id number]  TAKE 1 CAPSULE (200 MG TOTAL) BY MOUTH 2 (TWO) TIMES DAILY. **NEEDS TO BE SEEN BEFORE NEXT REFILL** Hawks, Christy A, FNP  Active   cetirizine (ZYRTEC) 10 MG tablet  [id number]  Take 10 mg by mouth daily as needed.  [provider]  Active   cholecalciferol (VITAMIN D ) 1000 units tablet [id number]  Take 1 tablet (1,000 Units total) by mouth daily. Carla Milling, RPH-CPP  Active   Cyanocobalamin (B-12) 3000 MCG CAPS [id number]  Take 1 capsule by mouth daily. [provider]  Active   diclofenac  Sodium (VOLTAREN ) 1 % GEL [id number]  Apply 2 g topically 4 (four) times daily. St Morton Hummer, Nena, NP  Active   docusate sodium (COLACE) 100 MG capsule [id number]  Take 200 mg by mouth 2 (two) times daily. [provider]  Active   fluticasone  (FLONASE ) 50 MCG/ACT nasal spray [id number]  SPRAY 2 SPRAYS INTO EACH NOSTRIL EVERY DAY Hawks, Christy A, FNP  Active   Glucose Blood (BLOOD GLUCOSE TEST STRIPS) STRP [id number]  1 each by In Vitro route in the morning, at noon, and at bedtime. May substitute to any manufacturer covered by patient's insurance. Lavell Lye A, FNP  Active   hydrochlorothiazide  (HYDRODIURIL ) 25 MG tablet [id number]  TAKE 1 TABLET (25 MG TOTAL) BY MOUTH DAILY. Lavell Lye LABOR, FNP  Active   Lancet Device MISC [id number]  1 each by Does not apply route in the morning, at noon, and at bedtime. May substitute to any manufacturer covered by patient's insurance. Lavell Lye LABOR, FNP  Active   Lancets Misc. MISC [id number]  1 each by Does not apply route in the morning, at noon, and at bedtime. May substitute to any manufacturer covered by patient's insurance. Lavell Lye A, FNP  Active   methocarbamol  (ROBAXIN ) 500 MG tablet [id number]  Take 1 tablet (500 mg total) by mouth every 6 (six) hours as needed for muscle spasms. Lavell Lye A, FNP  Active   omeprazole  (PRILOSEC OTC) 20 MG tablet [id number]  Take 20 mg by mouth daily. Pt takes 1/2 tablet in the morning and 1/2 tablet in the evening [provider]  Active   predniSONE  (DELTASONE ) 10 MG tablet [id number]  Take 1 tablet (10 mg total) by mouth 2 (two) times daily with a meal. St Morton Hummer, Nena, NP  Active   triamcinolone  (KENALOG ) 0.025 % ointment [id number]  Apply 1 Application topically 2 (two) times daily. Cathlene Marry Lenis, FNP  Active               Assessment/Plan:   Diabetes: - Currently controlled, newly diagnosed with A1c of 6.5% - Reviewed long term cardiovascular and renal outcomes of uncontrolled blood sugar - Reviewed goal A1c, goal fasting, and goal 2 hour post prandial glucose - Reviewed dietary modifications including FOLLOWING A HEART HEALTHY DIET/HEALTHY PLATE METHOD  Will mail  diabetes educational materials - Reviewed lifestyle modifications including: increased physical activity - Recommend to continue dietary and lifestyle modifications; do not recommend medication at this time  - Recommend to check glucose daily (fasting) or if symptomatic   Follow Up Plan: PCP in 1 month; PharmD as needed  Mliss Tarry Griffin, PharmD, BCACP, CPP Clinical Pharmacist, [facility]

## 2023-07-24 ENCOUNTER — Other Ambulatory Visit: Payer: Self-pay | Admitting: Family

## 2023-07-24 DIAGNOSIS — E1169 Type 2 diabetes mellitus with other specified complication: Secondary | ICD-10-CM

## 2023-07-31 ENCOUNTER — Ambulatory Visit: Admitting: Family

## 2023-08-08 ENCOUNTER — Encounter: Payer: Self-pay | Admitting: Surgical

## 2023-08-08 ENCOUNTER — Ambulatory Visit (INDEPENDENT_AMBULATORY_CARE_PROVIDER_SITE_OTHER): Admitting: Surgical

## 2023-08-08 DIAGNOSIS — M25551 Pain in right hip: Secondary | ICD-10-CM

## 2023-08-08 MED ORDER — LIDOCAINE HCL 1 % IJ SOLN
5.0000 mL | INTRAMUSCULAR | Status: AC | PRN
  Administered 2023-08-08: 5 mL

## 2023-08-08 MED ORDER — TRIAMCINOLONE ACETONIDE 40 MG/ML IJ SUSP
40.0000 mg | INTRAMUSCULAR | Status: AC | PRN
  Administered 2023-08-08: 40 mg via INTRA_ARTICULAR

## 2023-08-08 MED ORDER — BUPIVACAINE HCL 0.25 % IJ SOLN
4.0000 mL | INTRAMUSCULAR | Status: AC | PRN
  Administered 2023-08-08: 4 mL via INTRA_ARTICULAR

## 2023-08-08 NOTE — Progress Notes (Signed)
 Office Visit Note   Patient: Laura James           Date of Birth: 1950/03/04           MRN: [id number] Visit Date: 08/08/2023 Requested by: Deitra Morton Sebastian Nena, NP [address] [place],  [place] [address] PCP: Lavell Bari LABOR, FNP  Subjective: Chief Complaint  Patient presents with   Left Hip - Pain    Had injection last year by Dr Barbarann it helped a little    HPI: Laura James is a 73 y.o. female who presents to the office reporting primarily right hip pain.  She has history of trochanteric pain with lateral sided hip pain that makes it difficult to lay on her right side.  She has had multiple injections for this problem with injection by Dr. Margrette about 4 years ago that gave her great relief for 1 to 2 years.  She then had recurrence of pain and had repeat injection with Dr. Barbarann in June 2024 that reportedly gave her 70% relief of her right hip pain but according to her only lasted for about 2 to 3 weeks before pain recurred.  She has tried physical therapy without much relief.  She states that the most helpful thing outside of the injections is massage therapy.  She will take Motrin as well.  Does have some stiffness with immobility as well as some groin pain but the primary concern for her is the lateral sided pain.  She very rarely limps and when she does it is early in the morning when she first gets up.  Does not really have pain with walking.  No numbness or tingling, buttock, low back, radicular pain..                ROS: All systems reviewed are negative as they relate to the chief complaint within the history of present illness.  Patient denies fevers or chills.  Assessment & Plan: Visit Diagnoses:  1. Greater trochanteric pain syndrome of right lower extremity     Plan: Patient is a 73 year old female who presents with history of chronic trochanteric pain.  Had great relief from right troch injection several years ago and much more fleeting relief from her  injection last year.  There is no significant evidence of compromise of the gluteal tendons but she may have some degeneration and partial tearing that is only causing pain and not really any significant loss of mobility.  We discussed options available to patient such as repeat injection versus physical therapy versus MRI scan of the pelvis for further evaluation of gluteal tendinopathy.  She would like to try injection first.  We proceeded with right trochanteric injection with injection administered after needle probed to the trochanter and pulled back.  During administration of the injection there was no resistance to the flow of the injection.  She did have good relief near 100% during the anesthetic portion of the injection.  Recommended she reach out to the office in 2 to 3 weeks via MyChart and we can decide for or against ordering MRI scan.  Patient agreed with plan.  Follow-Up Instructions: No follow-ups on file.   Orders:  No orders of the defined types were placed in this encounter.  No orders of the defined types were placed in this encounter.     Procedures: Large Joint Inj: R greater trochanter on 08/08/2023 12:34 PM Indications: pain and diagnostic evaluation Details: 22 G 1.5 in needle, lateral  approach Medications: 4 mL bupivacaine  0.25 %; 40 mg triamcinolone  acetonide 40 MG/ML; 5 mL lidocaine  1 % Outcome: tolerated well, no immediate complications Procedure, treatment alternatives, risks and benefits explained, specific risks discussed. Consent was given by the patient. Immediately prior to procedure a time out was called to verify the correct patient, procedure, equipment, support staff and site/side marked as required. Patient was prepped and draped in the usual sterile fashion.       Clinical Data: No additional findings.  Objective: Vital Signs: There were no vitals taken for this visit.  Physical Exam:  Constitutional: Patient appears well-developed HEENT:   Head: Normocephalic Eyes:EOM are normal Neck: Normal range of motion Cardiovascular: Normal rate Pulmonary/chest: Effort normal Neurologic: Patient is alert Skin: Skin is warm Psychiatric: Patient has normal mood and affect  Ortho Exam: Ortho exam demonstrates right hip with tenderness focally over the greater trochanter.  She is able to abduct the hip in the lateral position.  Able to overall maintain the hip in an internally rotated position actively though this does reproduce a little bit of pain.  She does have some mechanical clicking over the trochanter with bicycle maneuver.  No pain with hip flexion or internal rotation.  No groin pain is really reproducible on exam.  She has intact hip flexion.  Ambulates without Trendelenburg gait.  Negative Trendelenburg test bilaterally.  Specialty Comments:  No specialty comments available.  Imaging: No results found.   PMFS History: Patient Active Problem List   Diagnosis Date Noted   Chronic left hip pain 07/17/2023   GAD (generalized anxiety disorder) 06/19/2023   Basal cell carcinoma 10/19/2022   Trochanteric bursitis, left hip 08/03/2022   History of right breast cancer 04/18/2022   Lumbar radiculopathy 03/10/2021   Low back pain 02/17/2021   Back pain with sciatica 11/16/2020   Ductal carcinoma in situ (DCIS) of right breast 10/26/2020   Hyperlipidemia 08/08/2018   Osteoarthritis 10/12/2016   Osteopenia 09/06/2016   Obstructive sleep apnea 12/23/2013   Essential hypertension, benign    Hiatal hernia    Diverticulosis of colon 12/30/2010   Gastritis, chronic 12/30/2010   IBS (irritable bowel syndrome) 08/25/2010   GERD (gastroesophageal reflux disease) 08/25/2010   Past Medical History:  Diagnosis Date   Barrett esophagus    Benign neoplasm of other and unspecified site of the digestive system 04/29/2007   Breast cancer (HCC) 10/13/2020   Chronic bronchitis (HCC)    Depression    Diverticulosis of colon (without  mention of hemorrhage)    Esophageal reflux    Essential hypertension, benign    Gastritis    Grave's disease    Hiatal hernia    Hypothyroidism    IBS (irritable bowel syndrome)    Obesity    Osteopenia    Personal history of radiation therapy    Sleep apnea    Symptomatic menopausal or female climacteric states     Family History  Problem Relation Age of Onset   Hypertension Mother    Arthritis Mother    Osteoporosis Mother    Kidney disease Mother        related to APAP use   Lung cancer Father    Hypertension Brother    Hypertension Daughter    Diabetes Daughter    Diabetes Son    Diabetes Other    Clotting disorder Other    Colon cancer Neg Hx     Past Surgical History:  Procedure Laterality Date   ABDOMINAL HYSTERECTOMY N/A  BREAST BIOPSY Right 09/22/2020   BREAST LUMPECTOMY Right 10/13/2020   BREAST LUMPECTOMY WITH RADIOACTIVE SEED LOCALIZATION Right 10/13/2020   Procedure: RIGHT BREAST LUMPECTOMY WITH RADIOACTIVE SEED LOCALIZATION;  Surgeon: Ebbie Cough, MD;  Location: [facility];  Service: General;  Laterality: Right;   CHOLECYSTECTOMY     INCONTINENCE SURGERY     MOHS SURGERY     TONSILLECTOMY     Social History   Occupational History   Occupation: Hospice- Chaplain    Comment: [place]   Occupation: retired  Tobacco Use   Smoking status: Former    Current packs/day: 0.00    Average packs/day: 0.5 packs/day for 5.0 years (2.5 ttl pk-yrs)    Types: Cigarettes    Start date: 01/24/1963    Quit date: 01/24/1968    Years since quitting: 55.5   Smokeless tobacco: Never  Vaping Use   Vaping status: Never Used  Substance and Sexual Activity   Alcohol use: No    Alcohol/week: 0.0 standard drinks of alcohol   Drug use: No   Sexual activity: Yes

## 2023-08-16 NOTE — Procedures (Signed)
 The patient was fitted the Resmed F30 and Resmed P10 with medium pillows. The patient tolerated both mask well, however felt that the F30 was a better fit. Both mask were given to the patient for home use.  -VB

## 2023-09-09 ENCOUNTER — Other Ambulatory Visit: Payer: Self-pay | Admitting: Family

## 2023-09-09 DIAGNOSIS — I10 Essential (primary) hypertension: Secondary | ICD-10-CM

## 2023-09-10 ENCOUNTER — Encounter: Payer: Self-pay | Admitting: Family

## 2023-09-10 ENCOUNTER — Ambulatory Visit (INDEPENDENT_AMBULATORY_CARE_PROVIDER_SITE_OTHER): Admitting: Family

## 2023-09-10 VITALS — BP 124/83 | HR 64 | Temp 97.5°F | Ht 65.0 in | Wt 193.0 lb

## 2023-09-10 DIAGNOSIS — G4733 Obstructive sleep apnea (adult) (pediatric): Secondary | ICD-10-CM

## 2023-09-10 DIAGNOSIS — Z853 Personal history of malignant neoplasm of breast: Secondary | ICD-10-CM

## 2023-09-10 DIAGNOSIS — I10 Essential (primary) hypertension: Secondary | ICD-10-CM | POA: Diagnosis not present

## 2023-09-10 DIAGNOSIS — M150 Primary generalized (osteo)arthritis: Secondary | ICD-10-CM | POA: Diagnosis not present

## 2023-09-10 DIAGNOSIS — K219 Gastro-esophageal reflux disease without esophagitis: Secondary | ICD-10-CM | POA: Diagnosis not present

## 2023-09-10 DIAGNOSIS — F411 Generalized anxiety disorder: Secondary | ICD-10-CM

## 2023-09-10 DIAGNOSIS — E785 Hyperlipidemia, unspecified: Secondary | ICD-10-CM | POA: Diagnosis not present

## 2023-09-10 DIAGNOSIS — Z0000 Encounter for general adult medical examination without abnormal findings: Secondary | ICD-10-CM | POA: Diagnosis not present

## 2023-09-10 DIAGNOSIS — M5416 Radiculopathy, lumbar region: Secondary | ICD-10-CM

## 2023-09-10 DIAGNOSIS — M8589 Other specified disorders of bone density and structure, multiple sites: Secondary | ICD-10-CM | POA: Diagnosis not present

## 2023-09-10 DIAGNOSIS — Z0001 Encounter for general adult medical examination with abnormal findings: Secondary | ICD-10-CM | POA: Diagnosis not present

## 2023-09-10 LAB — LIPID PANEL

## 2023-09-10 NOTE — Patient Instructions (Signed)

## 2023-09-10 NOTE — Progress Notes (Signed)
 Subjective:    Patient ID: Laura James, female    DOB: February 26, 1950, 73 y.o.   MRN: [id number]  Chief Complaint  Patient presents with   Diabetes   Pt presents to the office today for CPE.    PT is followed by Ortho as needed  for back pain and bursitis of hip.     She has hx right breast cancer and followed by Oncologists annually.   Followed by dermatologists every 6 months for skin checks and hx of basal cell.   She has OSA and uses CPAP 4-5 times a week.     Her stress has improved from caring for her brother-in-law. He is looking for a new place to live.  Hypertension This is a chronic problem. The current episode started more than 1 year ago. The problem has been resolved since onset. The problem is controlled. Associated symptoms include anxiety. Pertinent negatives include no blurred vision, malaise/fatigue, peripheral edema or shortness of breath (only when walking up hills). Risk factors for coronary artery disease include dyslipidemia, obesity and sedentary lifestyle. The current treatment provides moderate improvement.  Gastroesophageal Reflux She complains of belching and heartburn. This is a chronic problem. The current episode started more than 1 year ago. The problem occurs occasionally. The symptoms are aggravated by certain foods. Risk factors include obesity. She has tried a PPI for the symptoms. The treatment provided moderate relief.  Back Pain This is a chronic problem. The current episode started more than 1 year ago. The problem occurs intermittently. The problem has been waxing and waning since onset. The pain is present in the lumbar spine. The quality of the pain is described as aching. The pain is at a severity of 0/10 (since getting injections from Ortho). The pain is mild. The symptoms are aggravated by twisting and bending. She has tried NSAIDs for the symptoms. The treatment provided moderate relief.  Arthritis Presents for follow-up visit. She complains of  pain and stiffness. Affected locations include the left MCP, right MCP, right knee, left knee, left hip and right hip. Her pain is at a severity of 2/10.  Hyperlipidemia This is a chronic problem. The current episode started more than 1 year ago. The problem is controlled. Recent lipid tests were reviewed and are normal. Exacerbating diseases include obesity. Pertinent negatives include no shortness of breath (only when walking up hills). Current antihyperlipidemic treatment includes statins. The current treatment provides moderate improvement of lipids. Risk factors for coronary artery disease include dyslipidemia, hypertension, a sedentary lifestyle and post-menopausal.  Anxiety Presents for follow-up visit. Symptoms include nervous/anxious behavior and restlessness. Patient reports no excessive worry, irritability or shortness of breath (only when walking up hills). Symptoms occur occasionally. The severity of symptoms is mild.    Diabetes She presents for her follow-up diabetic visit. She has type 2 diabetes mellitus. Hypoglycemia symptoms include nervousness/anxiousness. Pertinent negatives for diabetes include no blurred vision and no foot paresthesias. Risk factors for coronary artery disease include hypertension, dyslipidemia, diabetes mellitus and sedentary lifestyle. She is following a generally healthy diet. Her overall blood glucose range is 90-110 mg/dl.      Review of Systems  Constitutional:  Negative for irritability and malaise/fatigue.  Eyes:  Negative for blurred vision.  Respiratory:  Negative for shortness of breath (only when walking up hills).   Gastrointestinal:  Positive for heartburn.  Musculoskeletal:  Positive for back pain and stiffness.  Psychiatric/Behavioral:  The patient is nervous/anxious.   All other systems reviewed and  are negative.  Family History  Problem Relation Age of Onset   Hypertension Mother    Arthritis Mother    Osteoporosis Mother    Kidney  disease Mother        related to APAP use   Lung cancer Father    Hypertension Brother    Hypertension Daughter    Diabetes Daughter    Diabetes Son    Diabetes Other    Clotting disorder Other    Colon cancer Neg Hx    Social History   Socioeconomic History   Marital status: Married    Spouse name: Sharolyn   Number of children: 3   Years of education: 16   Highest education level: Master's degree (e.g., MA, MS, MEng, MEd, MSW, MBA)  Occupational History   Occupation: Hospice- Chaplain    Comment: [place]   Occupation: retired  Tobacco Use   Smoking status: Former    Current packs/day: 0.00    Average packs/day: 0.5 packs/day for 5.0 years (2.5 ttl pk-yrs)    Types: Cigarettes    Start date: 01/24/1963    Quit date: 01/24/1968    Years since quitting: 55.6   Smokeless tobacco: Never  Vaping Use   Vaping status: Never Used  Substance and Sexual Activity   Alcohol use: No    Alcohol/week: 0.0 standard drinks of alcohol   Drug use: No   Sexual activity: Yes  Other Topics Concern   Not on file  Social History Narrative   Lives at home with husband    Social Drivers of Corporate investment banker Strain: Low Risk  (06/19/2023)   Overall Financial Resource Strain (CARDIA)    Difficulty of Paying Living Expenses: Not hard at all  Food Insecurity: No Food Insecurity (06/19/2023)   Hunger Vital Sign    Worried About Running Out of Food in the Last Year: Never true    Ran Out of Food in the Last Year: Never true  Transportation Needs: No Transportation Needs (06/19/2023)   PRAPARE - Administrator, Civil Service (Medical): No    Lack of Transportation (Non-Medical): No  Physical Activity: Insufficiently Active (06/19/2023)   Exercise Vital Sign    Days of Exercise per Week: 2 days    Minutes of Exercise per Session: 10 min  Stress: Stress Concern Present (06/19/2023)   [employer] of Occupational Health - Occupational Stress Questionnaire     Feeling of Stress : Very much  Social Connections: Socially Integrated (06/19/2023)   Social Connection and Isolation Panel    Frequency of Communication with Friends and Family: More than three times a week    Frequency of Social Gatherings with Friends and Family: More than three times a week    Attends Religious Services: More than 4 times per year    Active Member of [employer] or Organizations: Yes    Attends Engineer, structural: More than 4 times per year    Marital Status: Married       Objective:   Physical Exam Vitals reviewed.  Constitutional:      General: She is not in acute distress.    Appearance: She is well-developed.  HENT:     Head: Normocephalic and atraumatic.     Right Ear: Tympanic membrane normal.     Left Ear: Tympanic membrane normal.  Eyes:     Pupils: Pupils are equal, round, and reactive to light.  Neck:     Thyroid : No thyromegaly.  Cardiovascular:     Rate and Rhythm: Normal rate and regular rhythm.     Heart sounds: Normal heart sounds. No murmur heard. Pulmonary:     Effort: Pulmonary effort is normal. No respiratory distress.     Breath sounds: Normal breath sounds. No wheezing.  Abdominal:     General: Bowel sounds are normal. There is no distension.     Palpations: Abdomen is soft.     Tenderness: There is no abdominal tenderness.  Musculoskeletal:        General: No tenderness. Normal range of motion.     Cervical back: Normal range of motion and neck supple.  Skin:    General: Skin is warm and dry.  Neurological:     Mental Status: She is alert and oriented to person, place, and time.     Cranial Nerves: No cranial nerve deficit.     Deep Tendon Reflexes: Reflexes are normal and symmetric.  Psychiatric:        Behavior: Behavior normal.        Thought Content: Thought content normal.        Judgment: Judgment normal.       BP 124/83   Pulse 64   Temp (!) 97.5 F (36.4 C) (Temporal)   Ht 5' 5 (1.651 m)   Wt 193 lb  (87.5 kg)   SpO2 96%   BMI 32.12 kg/m      Assessment & Plan:  Mikah Rottinghaus Malcolm comes in today with chief complaint of Diabetes   Diagnosis and orders addressed:  1. Annual physical exam (Primary) - CMP14+EGFR - CBC with Differential/Platelet - Lipid panel - TSH  2. Essential hypertension, benign - CMP14+EGFR - CBC with Differential/Platelet - TSH  3. GAD (generalized anxiety disorder) - CMP14+EGFR - CBC with Differential/Platelet  4. Gastroesophageal reflux disease without esophagitis - CMP14+EGFR - CBC with Differential/Platelet  5. Hyperlipidemia, unspecified hyperlipidemia type  - CMP14+EGFR - CBC with Differential/Platelet - Lipid panel - TSH  6. History of right breast cancer - CMP14+EGFR - CBC with Differential/Platelet  7. Osteopenia of multiple sites - CMP14+EGFR - CBC with Differential/Platelet  8. Primary osteoarthritis involving multiple joints - CMP14+EGFR - CBC with Differential/Platelet  9. Obstructive sleep apnea - For home use only DME continuous positive airway pressure (CPAP) - CMP14+EGFR - CBC with Differential/Platelet  10. Lumbar radiculopathy - CMP14+EGFR - CBC with Differential/Platelet  Labs pending Continue all medications Health Maintenance reviewed Diet and exercise encouraged  Follow up plan: 6 months    Bari Learn, FNP

## 2023-09-11 ENCOUNTER — Ambulatory Visit: Payer: Self-pay | Admitting: Family

## 2023-09-11 DIAGNOSIS — G4733 Obstructive sleep apnea (adult) (pediatric): Secondary | ICD-10-CM

## 2023-09-11 LAB — CBC WITH DIFFERENTIAL/PLATELET
Basophils Absolute: 0 x10E3/uL (ref 0.0–0.2)
Basos: 0 %
EOS (ABSOLUTE): 0.3 x10E3/uL (ref 0.0–0.4)
Eos: 3 %
Hematocrit: 42.2 % (ref 34.0–46.6)
Hemoglobin: 13.4 g/dL (ref 11.1–15.9)
Immature Grans (Abs): 0 x10E3/uL (ref 0.0–0.1)
Immature Granulocytes: 0 %
Lymphocytes Absolute: 1.9 x10E3/uL (ref 0.7–3.1)
Lymphs: 26 %
MCH: 28.2 pg (ref 26.6–33.0)
MCHC: 31.8 g/dL (ref 31.5–35.7)
MCV: 89 fL (ref 79–97)
Monocytes Absolute: 0.5 x10E3/uL (ref 0.1–0.9)
Monocytes: 6 %
Neutrophils Absolute: 4.7 x10E3/uL (ref 1.4–7.0)
Neutrophils: 65 %
Platelets: 270 x10E3/uL (ref 150–450)
RBC: 4.76 x10E6/uL (ref 3.77–5.28)
RDW: 14.5 % (ref 11.7–15.4)
WBC: 7.4 x10E3/uL (ref 3.4–10.8)

## 2023-09-11 LAB — TSH: TSH: 1.08 u[IU]/mL (ref 0.450–4.500)

## 2023-09-11 LAB — CMP14+EGFR
ALT: 23 IU/L (ref 0–32)
AST: 24 IU/L (ref 0–40)
Albumin: 3.7 g/dL — AB (ref 3.8–4.8)
Alkaline Phosphatase: 80 IU/L (ref 44–121)
BUN/Creatinine Ratio: 14 (ref 12–28)
BUN: 13 mg/dL (ref 8–27)
Bilirubin Total: 0.3 mg/dL (ref 0.0–1.2)
CO2: 25 mmol/L (ref 20–29)
Calcium: 9.7 mg/dL (ref 8.7–10.3)
Chloride: 102 mmol/L (ref 96–106)
Creatinine, Ser: 0.94 mg/dL (ref 0.57–1.00)
Globulin, Total: 2.4 g/dL (ref 1.5–4.5)
Glucose: 95 mg/dL (ref 70–99)
Potassium: 3.9 mmol/L (ref 3.5–5.2)
Sodium: 141 mmol/L (ref 134–144)
Total Protein: 6.1 g/dL (ref 6.0–8.5)
eGFR: 63 mL/min/1.73 (ref >59)

## 2023-09-11 LAB — LIPID PANEL
Cholesterol, Total: 140 mg/dL (ref 100–199)
HDL: 52 mg/dL (ref >39)
LDL CALC COMMENT:: 2.7 ratio (ref 0.0–4.4)
LDL Chol Calc (NIH): 75 mg/dL (ref 0–99)
Triglycerides: 65 mg/dL (ref 0–149)
VLDL Cholesterol Cal: 13 mg/dL (ref 5–40)

## 2023-09-14 ENCOUNTER — Ambulatory Visit
Admission: RE | Admit: 2023-09-14 | Discharge: 2023-09-14 | Disposition: A | Discharge status: Home / Self Care | Source: Ambulatory Visit | Attending: Adult Health | Admitting: Adult Health

## 2023-09-14 DIAGNOSIS — R928 Other abnormal and inconclusive findings on diagnostic imaging of breast: Secondary | ICD-10-CM | POA: Diagnosis not present

## 2023-09-14 DIAGNOSIS — D0511 Intraductal carcinoma in situ of right breast: Secondary | ICD-10-CM

## 2023-09-14 DIAGNOSIS — R92323 Mammographic fibroglandular density, bilateral breasts: Secondary | ICD-10-CM | POA: Diagnosis not present

## 2023-09-21 NOTE — Telephone Encounter (Signed)
Prescription ready to be faxed

## 2023-09-21 NOTE — Telephone Encounter (Signed)
 Copied from CRM #[id number]. Topic: Clinical - Prescription Issue >> Sep 21, 2023  8:48 AM Miller H wrote: Reason for CRM: [facility] needs prescription for cpap//Faxed to [phone] [facility] [phone]

## 2023-10-01 NOTE — Addendum Note (Signed)
 Addended by: LAVELL LYE A on: 10/01/2023 09:31 AM   Modules accepted: Orders

## 2023-10-04 DIAGNOSIS — Z08 Encounter for follow-up examination after completed treatment for malignant neoplasm: Secondary | ICD-10-CM | POA: Diagnosis not present

## 2023-10-04 DIAGNOSIS — L538 Other specified erythematous conditions: Secondary | ICD-10-CM | POA: Diagnosis not present

## 2023-10-04 DIAGNOSIS — L821 Other seborrheic keratosis: Secondary | ICD-10-CM | POA: Diagnosis not present

## 2023-10-04 DIAGNOSIS — L814 Other melanin hyperpigmentation: Secondary | ICD-10-CM | POA: Diagnosis not present

## 2023-10-04 DIAGNOSIS — L578 Other skin changes due to chronic exposure to nonionizing radiation: Secondary | ICD-10-CM | POA: Diagnosis not present

## 2023-10-04 DIAGNOSIS — Z85828 Personal history of other malignant neoplasm of skin: Secondary | ICD-10-CM | POA: Diagnosis not present

## 2023-10-04 DIAGNOSIS — D1801 Hemangioma of skin and subcutaneous tissue: Secondary | ICD-10-CM | POA: Diagnosis not present

## 2023-10-04 DIAGNOSIS — L820 Inflamed seborrheic keratosis: Secondary | ICD-10-CM | POA: Diagnosis not present

## 2023-10-07 ENCOUNTER — Other Ambulatory Visit: Payer: Self-pay | Admitting: Family

## 2023-10-07 DIAGNOSIS — F411 Generalized anxiety disorder: Secondary | ICD-10-CM

## 2023-10-23 ENCOUNTER — Ambulatory Visit: Admitting: Family

## 2023-10-23 ENCOUNTER — Encounter: Payer: Self-pay | Admitting: Family

## 2023-10-23 VITALS — BP 115/75 | HR 62 | Temp 97.6°F | Ht 65.0 in | Wt 199.0 lb

## 2023-10-23 DIAGNOSIS — M2560 Stiffness of unspecified joint, not elsewhere classified: Secondary | ICD-10-CM

## 2023-10-23 DIAGNOSIS — M5416 Radiculopathy, lumbar region: Secondary | ICD-10-CM

## 2023-10-23 DIAGNOSIS — K219 Gastro-esophageal reflux disease without esophagitis: Secondary | ICD-10-CM

## 2023-10-23 DIAGNOSIS — I10 Essential (primary) hypertension: Secondary | ICD-10-CM

## 2023-10-23 DIAGNOSIS — G4733 Obstructive sleep apnea (adult) (pediatric): Secondary | ICD-10-CM | POA: Diagnosis not present

## 2023-10-23 DIAGNOSIS — F411 Generalized anxiety disorder: Secondary | ICD-10-CM | POA: Diagnosis not present

## 2023-10-23 DIAGNOSIS — E785 Hyperlipidemia, unspecified: Secondary | ICD-10-CM | POA: Diagnosis not present

## 2023-10-23 DIAGNOSIS — M549 Dorsalgia, unspecified: Secondary | ICD-10-CM

## 2023-10-23 DIAGNOSIS — Z23 Encounter for immunization: Secondary | ICD-10-CM

## 2023-10-23 DIAGNOSIS — M5430 Sciatica, unspecified side: Secondary | ICD-10-CM

## 2023-10-23 DIAGNOSIS — G8929 Other chronic pain: Secondary | ICD-10-CM

## 2023-10-23 DIAGNOSIS — E119 Type 2 diabetes mellitus without complications: Secondary | ICD-10-CM | POA: Insufficient documentation

## 2023-10-23 DIAGNOSIS — E1169 Type 2 diabetes mellitus with other specified complication: Secondary | ICD-10-CM

## 2023-10-23 DIAGNOSIS — M150 Primary generalized (osteo)arthritis: Secondary | ICD-10-CM | POA: Diagnosis not present

## 2023-10-23 DIAGNOSIS — M25552 Pain in left hip: Secondary | ICD-10-CM

## 2023-10-23 MED ORDER — BUSPIRONE HCL 5 MG PO TABS: 5.0000 mg | ORAL_TABLET | Freq: Three times a day (TID) | ORAL | 2 refills | Status: AC | PRN

## 2023-10-23 MED ORDER — CELECOXIB 200 MG PO CAPS
200.0000 mg | ORAL_CAPSULE | Freq: Two times a day (BID) | ORAL | 2 refills | Status: AC
Start: 2023-10-23 — End: ?

## 2023-10-23 MED ORDER — METHOCARBAMOL 500 MG PO TABS: 500.0000 mg | ORAL_TABLET | Freq: Four times a day (QID) | ORAL | 1 refills | Status: AC | PRN

## 2023-10-23 MED ORDER — ATENOLOL 25 MG PO TABS: 25.0000 mg | ORAL_TABLET | Freq: Every day | ORAL | 0 refills | Status: AC

## 2023-10-23 MED ORDER — HYDROCHLOROTHIAZIDE 25 MG PO TABS: 25.0000 mg | ORAL_TABLET | Freq: Every day | ORAL | 3 refills | Status: AC

## 2023-10-23 NOTE — Progress Notes (Signed)
 Subjective:    Patient ID: Laura James, female    DOB: February 21, 1950, 73 y.o.   MRN: [id number]  Chief Complaint  Patient presents with   Annual Exam   Medical Management of Chronic Issues   Pt presents to the office today for chronic follow up.   PT is followed by Ortho as needed  for back pain and bursitis of hip.     She has hx right breast cancer and followed by Oncologists annually.   Followed by dermatologists every 6 months for skin checks and hx of basal cell.   She has OSA and uses CPAP nightly.     Her stress has improved from caring for her brother-in-law. He is looking for a new place to live.  Hypertension This is a chronic problem. The current episode started more than 1 year ago. The problem has been resolved since onset. The problem is controlled. Associated symptoms include anxiety, malaise/fatigue, peripheral edema and shortness of breath (only when walking up hills). Pertinent negatives include no blurred vision. Risk factors for coronary artery disease include dyslipidemia, obesity and sedentary lifestyle. The current treatment provides moderate improvement.  Gastroesophageal Reflux She complains of belching and heartburn. This is a chronic problem. The current episode started more than 1 year ago. The problem occurs occasionally. The symptoms are aggravated by certain foods. Risk factors include obesity. She has tried a PPI for the symptoms. The treatment provided moderate relief.  Back Pain This is a chronic problem. The current episode started more than 1 year ago. The problem occurs intermittently. The problem has been waxing and waning since onset. The pain is present in the lumbar spine. The quality of the pain is described as aching. The pain is at a severity of 6/10. The pain is mild. The symptoms are aggravated by twisting and bending. Risk factors include obesity. She has tried NSAIDs for the symptoms. The treatment provided moderate relief.   Arthritis Presents for follow-up visit. She complains of pain and stiffness. Affected locations include the left MCP, right MCP, right knee, left knee, left hip and right hip. Her pain is at a severity of 8/10.  Hyperlipidemia This is a chronic problem. The current episode started more than 1 year ago. The problem is controlled. Recent lipid tests were reviewed and are normal. Exacerbating diseases include obesity. Associated symptoms include shortness of breath (only when walking up hills). Current antihyperlipidemic treatment includes statins. The current treatment provides moderate improvement of lipids. Risk factors for coronary artery disease include dyslipidemia, hypertension, a sedentary lifestyle and post-menopausal.  Anxiety Presents for follow-up visit. Symptoms include nervous/anxious behavior, restlessness and shortness of breath (only when walking up hills). Patient reports no excessive worry or irritability. Symptoms occur occasionally. The severity of symptoms is mild.    Diabetes She presents for her follow-up diabetic visit. She has type 2 diabetes mellitus. Hypoglycemia symptoms include nervousness/anxiousness. Pertinent negatives for diabetes include no blurred vision and no foot paresthesias. Risk factors for coronary artery disease include hypertension, dyslipidemia, diabetes mellitus and sedentary lifestyle. She is following a generally healthy diet. Her overall blood glucose range is 110-130 mg/dl.      Review of Systems  Constitutional:  Positive for malaise/fatigue. Negative for irritability.  Eyes:  Negative for blurred vision.  Respiratory:  Positive for shortness of breath (only when walking up hills).   Gastrointestinal:  Positive for heartburn.  Musculoskeletal:  Positive for back pain and stiffness.  Psychiatric/Behavioral:  The patient is nervous/anxious.  All other systems reviewed and are negative.  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[place]   Occupation: retired  Tobacco Use   Smoking status: Former    Current packs/day: 0.00    Average packs/day: 0.5 packs/day for 5.0 years (2.5 ttl pk-yrs)    Types: Cigarettes    Start date: 01/24/1963    Quit date: 01/24/1968    Years since quitting: 55.7   Smokeless tobacco: Never  Vaping Use   Vaping status: Never Used  Substance and Sexual Activity   Alcohol use: No    Alcohol/week: 0.0 standard drinks of alcohol   Drug use: No   Sexual activity: Yes  Other Topics Concern   Not on file  Social History Narrative   Lives at home with husband    Social Drivers of Corporate investment banker Strain: Low Risk  (10/22/2023)   Overall Financial Resource Strain (CARDIA)    Difficulty of Paying Living Expenses: Not hard at all  Food Insecurity: Unknown (10/22/2023)   Hunger Vital Sign    Worried About Running Out of Food in the Last Year: Never true    Ran Out of Food in the Last Year: Not on file  Transportation Needs: No Transportation Needs (10/22/2023)   PRAPARE - Administrator, Civil Service (Medical): No    Lack of Transportation (Non-Medical): No  Physical Activity: Inactive (10/22/2023)   Exercise Vital Sign    Days of Exercise per Week: 0 days    Minutes of Exercise per Session: Not on file  Stress: No Stress Concern Present (10/22/2023)   [employer] of  Occupational Health - Occupational Stress Questionnaire    Feeling of Stress: Only a little  Social Connections: Socially Integrated (10/22/2023)   Social Connection and Isolation Panel    Frequency of Communication with Friends and Family: More than three times a week    Frequency of Social Gatherings with Friends and Family: More than three times a week    Attends Religious Services: 1 to 4 times per year    Active Member of [employer] or Organizations: Yes    Attends Banker Meetings: 1 to 4 times per year    Marital Status: Married       Objective:   Physical Exam Vitals reviewed.  Constitutional:      General: She is not in acute distress.    Appearance: She is well-developed. She is obese.  HENT:     Head: Normocephalic and atraumatic.     Right Ear: Tympanic membrane normal.     Left Ear: Tympanic membrane normal.  Eyes:     Pupils: Pupils are equal, round, and reactive to light.  Neck:  Thyroid : No thyromegaly.  Cardiovascular:     Rate and Rhythm: Normal rate and regular rhythm.     Heart sounds: Normal heart sounds. No murmur heard. Pulmonary:     Effort: Pulmonary effort is normal. No respiratory distress.     Breath sounds: Normal breath sounds. No wheezing.  Abdominal:     General: Bowel sounds are normal. There is no distension.     Palpations: Abdomen is soft.     Tenderness: There is no abdominal tenderness.  Musculoskeletal:        General: No tenderness. Normal range of motion.     Cervical back: Normal range of motion and neck supple.     Right lower leg: Edema (trace) present.     Left lower leg: Edema (trace) present.  Skin:    General: Skin is warm and dry.  Neurological:     Mental Status: She is alert and oriented to person, place, and time.     Cranial Nerves: No cranial nerve deficit.     Deep Tendon Reflexes: Reflexes are normal and symmetric.  Psychiatric:        Behavior: Behavior normal.        Thought Content: Thought content  normal.        Judgment: Judgment normal.       BP 115/75   Pulse 62   Temp 97.6 F (36.4 C) (Temporal)   Ht 5' 5 (1.651 m)   Wt 199 lb (90.3 kg)   SpO2 93%   BMI 33.12 kg/m      Assessment & Plan:  Tamisha Nordstrom Salmi comes in today with chief complaint of Annual Exam and Medical Management of Chronic Issues   Diagnosis and orders addressed:  1. Essential hypertension, benign - atenolol  (TENORMIN ) 25 MG tablet; Take 1 tablet (25 mg total) by mouth daily.  Dispense: 90 tablet; Refill: 0 - hydrochlorothiazide  (HYDRODIURIL ) 25 MG tablet; Take 1 tablet (25 mg total) by mouth daily.  Dispense: 90 tablet; Refill: 3  2. GAD (generalized anxiety disorder) - busPIRone  (BUSPAR ) 5 MG tablet; Take 1 tablet (5 mg total) by mouth 3 (three) times daily as needed.  Dispense: 270 tablet; Refill: 2  3. Primary osteoarthritis involving multiple joints - celecoxib  (CELEBREX ) 200 MG capsule; Take 1 capsule (200 mg total) by mouth 2 (two) times daily. **NEEDS TO BE SEEN BEFORE NEXT REFILL**  Dispense: 180 capsule; Refill: 2 - methocarbamol  (ROBAXIN ) 500 MG tablet; Take 1 tablet (500 mg total) by mouth every 6 (six) hours as needed for muscle spasms.  Dispense: 90 tablet; Refill: 1  4. Back pain with sciatica  - celecoxib  (CELEBREX ) 200 MG capsule; Take 1 capsule (200 mg total) by mouth 2 (two) times daily. **NEEDS TO BE SEEN BEFORE NEXT REFILL**  Dispense: 180 capsule; Refill: 2  5. Multiple stiff joints - methocarbamol  (ROBAXIN ) 500 MG tablet; Take 1 tablet (500 mg total) by mouth every 6 (six) hours as needed for muscle spasms.  Dispense: 90 tablet; Refill: 1  6. Encounter for immunization (Primary) - Flu vaccine HIGH DOSE PF(Fluzone Trivalent)  7. Gastroesophageal reflux disease without esophagitis  8. Lumbar radiculopathy  9. Chronic left hip pain  10. Hyperlipidemia, unspecified hyperlipidemia type  11. Obstructive sleep apnea  12. Type 2 diabetes mellitus with other specified  complication, without long-term current use of insulin (HCC) - Microalbumin / creatinine urine ratio   Labs pending Continue all medications Health Maintenance reviewed Diet and exercise encouraged  Follow up plan: 6 months  Bari Learn, FNP

## 2023-10-23 NOTE — Patient Instructions (Signed)

## 2023-10-24 LAB — MICROALBUMIN / CREATININE URINE RATIO
Creatinine, Urine: 163.5 mg/dL
Microalb/Creat Ratio: 2 mg/g{creat} (ref 0–29)
Microalbumin, Urine: 3.8 ug/mL

## 2023-10-25 ENCOUNTER — Ambulatory Visit: Payer: Self-pay | Admitting: Family

## 2023-11-05 ENCOUNTER — Inpatient Hospital Stay: Payer: Medicare Other | Attending: Hematology and Oncology | Admitting: Hematology and Oncology

## 2023-11-05 VITALS — BP 120/76 | HR 67 | Temp 98.2°F | Resp 18 | Ht 65.0 in | Wt 199.8 lb

## 2023-11-05 DIAGNOSIS — Z923 Personal history of irradiation: Secondary | ICD-10-CM | POA: Diagnosis not present

## 2023-11-05 DIAGNOSIS — Z1721 Progesterone receptor positive status: Secondary | ICD-10-CM | POA: Insufficient documentation

## 2023-11-05 DIAGNOSIS — Z79810 Long term (current) use of selective estrogen receptor modulators (SERMs): Secondary | ICD-10-CM | POA: Insufficient documentation

## 2023-11-05 DIAGNOSIS — D0511 Intraductal carcinoma in situ of right breast: Secondary | ICD-10-CM | POA: Diagnosis not present

## 2023-11-05 DIAGNOSIS — Z780 Asymptomatic menopausal state: Secondary | ICD-10-CM | POA: Diagnosis not present

## 2023-11-05 DIAGNOSIS — Z170 Estrogen receptor positive status [ER+]: Secondary | ICD-10-CM | POA: Diagnosis not present

## 2023-11-05 DIAGNOSIS — R079 Chest pain, unspecified: Secondary | ICD-10-CM | POA: Insufficient documentation

## 2023-11-05 DIAGNOSIS — M79603 Pain in arm, unspecified: Secondary | ICD-10-CM | POA: Diagnosis not present

## 2023-11-05 NOTE — Progress Notes (Signed)
 Patient Care Team: Lavell Bari LABOR, FNP as PCP - General (Family Medicine) Dannielle Alm BRAVO, MD as Consulting Physician (Radiation Oncology) Odean Potts, MD as Consulting Physician (Hematology and Oncology) Stacia Glendia BRAVO, MD as Consulting Physician (Gastroenterology) Jude Harden GAILS, MD as Consulting Physician (Pulmonary Disease) Ebbie Cough, MD as Consulting Physician (General Surgery) [facility] Gladis Husband, MD as Referring Physician (Dermatology)  DIAGNOSIS:  Encounter Diagnosis  Name Primary?   Ductal carcinoma in situ (DCIS) of right breast Yes    SUMMARY OF ONCOLOGIC HISTORY: Oncology History  Ductal carcinoma in situ (DCIS) of right breast  09/13/2020 Initial Diagnosis   Screening mammogram: calcifications in the right breast. Diagnostic mammogram and US : 1 cm group of indeterminate calcifications within the upper outer right breast.    10/13/2020 Surgery   Right lumpectomy: intermediate grade DCIS 0.6 cm with margins uninvolved by carcinoma.  ER 95%, PR 90%   10/13/2020 Cancer Staging   Staging form: Breast, AJCC 8th Edition - Pathologic stage from 10/13/2020: Stage 0 (pTis (DCIS), pN0, cM0) - Signed by Crawford Morna Pickle, NP on 04/26/2021 Stage prefix: Initial diagnosis   11/29/2020 - 12/22/2020 Radiation Therapy   Site: Right Breast Technique: 3D CRT Goal: Curative Whole Breast 4272 cGy in 267 cGy daily fractions (16 Fractions)   01/27/2021 -  Anti-estrogen oral therapy   Tamoxifen      CHIEF COMPLIANT: Surveillance of breast cancer  HISTORY OF PRESENT ILLNESS:   History of Present Illness Laura James is a 73 year old female with a history of breast cancer who presents for follow-up regarding post-treatment symptoms.  She experiences severe, sharp, jab-like pain under her arm, particularly with awkward movements. Various treatments, including Voltaren  gel and CBD oils, have provided inconsistent relief.  A mammogram  performed in May showed no abnormalities.     ALLERGIES:  is allergic to adhesive [tape] and latex.  MEDICATIONS:  Current Outpatient Medications  Medication Sig Dispense Refill   albuterol  (PROAIR  HFA) 108 (90 Base) MCG/ACT inhaler INHALE 2 PUFFS BY MOUTH EVERY 6 HOURS AS NEEDED FOR WHEEZE OR SHORTNESS OF BREATH 8.5 each 2   aspirin 81 MG tablet Take 81 mg by mouth daily.     atenolol  (TENORMIN ) 25 MG tablet Take 1 tablet (25 mg total) by mouth daily. 90 tablet 0   atorvastatin  (LIPITOR) 20 MG tablet Take 1 tablet (20 mg total) by mouth daily. 90 tablet 1   Blood Glucose Monitoring Suppl DEVI 1 each by Does not apply route in the morning, at noon, and at bedtime. May substitute to any manufacturer covered by patient's insurance. 1 each 0   busPIRone  (BUSPAR ) 5 MG tablet Take 1 tablet (5 mg total) by mouth 3 (three) times daily as needed. 270 tablet 2   celecoxib  (CELEBREX ) 200 MG capsule Take 1 capsule (200 mg total) by mouth 2 (two) times daily. **NEEDS TO BE SEEN BEFORE NEXT REFILL** 180 capsule 2   cetirizine (ZYRTEC) 10 MG tablet Take 10 mg by mouth daily as needed.      cholecalciferol (VITAMIN D ) 1000 units tablet Take 1 tablet (1,000 Units total) by mouth daily.     Cyanocobalamin (B-12) 3000 MCG CAPS Take 1 capsule by mouth daily.     diclofenac  Sodium (VOLTAREN ) 1 % GEL Apply 2 g topically 4 (four) times daily. 150 g 0   fluticasone  (FLONASE ) 50 MCG/ACT nasal spray SPRAY 2 SPRAYS INTO EACH NOSTRIL EVERY DAY 48 mL 1   glucose blood (ACCU-CHEK  GUIDE TEST) test strip Test BS in the morning, at noon and at bedtime Dx E11.69 300 strip 3   hydrochlorothiazide  (HYDRODIURIL ) 25 MG tablet Take 1 tablet (25 mg total) by mouth daily. 90 tablet 3   methocarbamol  (ROBAXIN ) 500 MG tablet Take 1 tablet (500 mg total) by mouth every 6 (six) hours as needed for muscle spasms. 90 tablet 1   omeprazole  (PRILOSEC OTC) 20 MG tablet Take 20 mg by mouth daily. Pt takes 1/2 tablet in the morning and 1/2  tablet in the evening     No current facility-administered medications for this visit.    PHYSICAL EXAMINATION: ECOG PERFORMANCE STATUS: 1 - Symptomatic but completely ambulatory  Vitals:   11/05/23 1055  BP: 120/76  Pulse: 67  Resp: 18  Temp: 98.2 F (36.8 C)  SpO2: 96%   Filed Weights   11/05/23 1055  Weight: 199 lb 12.8 oz (90.6 kg)    Physical Exam No palpable lumps or nodules in bilateral breasts or axilla.  (exam performed in the presence of a chaperone)  LABORATORY DATA:  I have reviewed the data as listed    Latest Ref Rng & Units 09/10/2023   11:08 AM 06/19/2023   10:35 AM 10/19/2022    9:04 AM  CMP  Glucose 70 - 99 mg/dL 95  864  93   BUN 8 - 27 mg/dL 13  12  16    Creatinine 0.57 - 1.00 mg/dL 9.05  9.03  9.04   Sodium 134 - 144 mmol/L 141  140  141   Potassium 3.5 - 5.2 mmol/L 3.9  3.8  4.1   Chloride 96 - 106 mmol/L 102  103  101   CO2 20 - 29 mmol/L 25  22  30    Calcium  8.7 - 10.3 mg/dL 9.7  9.6  89.7   Total Protein 6.0 - 8.5 g/dL 6.1  6.4  6.6   Total Bilirubin 0.0 - 1.2 mg/dL 0.3  0.3  0.3   Alkaline Phos 44 - 121 IU/L 80  102  93   AST 0 - 40 IU/L 24  25  21    ALT 0 - 32 IU/L 23  18  26      Lab Results  Component Value Date   WBC 7.4 09/10/2023   HGB 13.4 09/10/2023   HCT 42.2 09/10/2023   MCV 89 09/10/2023   PLT 270 09/10/2023   NEUTROABS 4.7 09/10/2023    ASSESSMENT & PLAN:  Ductal carcinoma in situ (DCIS) of right breast 10/13/2020:Right lumpectomy: intermediate grade DCIS 0.6 cm with margins uninvolved by carcinoma.  ER 95%, PR 90%    Recommendation: 1. Followed by adjuvant radiation therapy (to be done at [facility]) completed 12/21/20 2. Followed by antiestrogen therapy with tamoxifen  5 years to be started 01/27/21 discontinued May 2023 because of profound vaginal discharge.  It resolved after she stopped it.    Breast cancer surveillance: 1.  Breast exam 11/05/2023: Benign 2. Mammogram 09/14/2023: Benign Density Cat B  Bone density  04/18/2022: T-score -2.1 osteopenia  Return to clinic in 1 year for follow-up and after that she could be seen on an as-needed basis.   Assessment & Plan Ductal carcinoma in situ of right breast, post-treatment Three years post-diagnosis, stable with no issues post-tamoxifen . Recent mammogram normal. - Perform quick chest exam.  Post-surgical and post-radiation pain of right chest and axilla Intermittent severe pain likely from nerve entrapment in scar tissue. Previous treatments variably effective. - Advise skin moisturizing to prevent  dryness.      No orders of the defined types were placed in this encounter.  The patient has a good understanding of the overall plan. she agrees with it. she will call with any problems that may develop before the next visit here.  I personally spent a total of 30 minutes in the care of the patient today including preparing to see the patient, getting/reviewing separately obtained history, performing a medically appropriate exam/evaluation, counseling and educating, placing orders, referring and communicating with other health care professionals, documenting clinical information in the EHR, independently interpreting results, communicating results, and coordinating care.   Viinay K Zahava Quant, MD 11/05/23

## 2023-11-05 NOTE — Assessment & Plan Note (Signed)
 10/13/2020:Right lumpectomy: intermediate grade DCIS 0.6 cm with margins uninvolved by carcinoma.  ER 95%, PR 90%    Recommendation: 1. Followed by adjuvant radiation therapy (to be done at [facility]) completed 12/21/20 2. Followed by antiestrogen therapy with tamoxifen  5 years to be started 01/27/21 discontinued May 2023 because of profound vaginal discharge.  It resolved after she stopped it.    Breast cancer surveillance: 1.  Breast exam 11/05/2023: Benign 2. Mammogram 09/14/2023: Benign Density Cat B  Bone density 04/18/2022: T-score -2.1 osteopenia  Return to clinic in 1 year for long-term survivorship and follow-up

## 2023-11-09 ENCOUNTER — Other Ambulatory Visit: Payer: Self-pay | Admitting: Family

## 2023-11-14 ENCOUNTER — Ambulatory Visit: Admitting: Surgical

## 2023-11-14 ENCOUNTER — Encounter: Payer: Self-pay | Admitting: Surgical

## 2023-11-14 ENCOUNTER — Other Ambulatory Visit: Payer: Self-pay

## 2023-11-14 DIAGNOSIS — M25562 Pain in left knee: Secondary | ICD-10-CM

## 2023-11-14 DIAGNOSIS — M25462 Effusion, left knee: Secondary | ICD-10-CM | POA: Diagnosis not present

## 2023-11-14 MED ORDER — TRIAMCINOLONE ACETONIDE 40 MG/ML IJ SUSP
40.0000 mg | INTRAMUSCULAR | Status: AC | PRN
  Administered 2023-11-14: 40 mg via INTRA_ARTICULAR

## 2023-11-14 MED ORDER — LIDOCAINE HCL 1 % IJ SOLN
5.0000 mL | INTRAMUSCULAR | Status: AC | PRN
  Administered 2023-11-14: 5 mL

## 2023-11-14 MED ORDER — BUPIVACAINE HCL 0.25 % IJ SOLN
4.0000 mL | INTRAMUSCULAR | Status: AC | PRN
  Administered 2023-11-14: 4 mL via INTRA_ARTICULAR

## 2023-11-14 NOTE — Progress Notes (Signed)
 Office Visit Note   Patient: Laura James           Date of Birth: 16-Aug-1950           MRN: [id number] Visit Date: 11/14/2023 Requested by: Lavell Bari LABOR, FNP [address] [place],  [place] [address] PCP: Lavell Bari LABOR, FNP  Subjective: Chief Complaint  Patient presents with   left knee pain    HPI: Laura James is a 73 y.o. female who presents to the office reporting left knee pain.  Patient states that she has had pain over the last week.  She denies any history of injury.  She said that she went to bed the previous night completely normal and as she woke up in the morning around 6:30 AM, she stepped out of bed and felt immediate pain in her knee.  She describes difficulty going up and down stairs.  Has not really noticed any swelling or warmth.  No fevers or chills.  No history of surgery on the knee and no history of gout.  She describes a deep pain globally but somewhat worse on the lateral side of the knee.  She has had very sporadic new clicking sensation in the knee but nothing consistent with every motion that she makes for her knee.  She has difficulty with flexion of the knee.  No new groin pain.  No numbness or tingling.  No radiation of pain.  She has tried taking Celebrex  twice a day per the instructions of her PCP and this is provided no relief for her..                ROS: All systems reviewed are negative as they relate to the chief complaint within the history of present illness.  Patient denies fevers or chills.  Assessment & Plan: Visit Diagnoses:  1. Effusion, left knee   2. Acute pain of left knee     Plan: Impression is 73 year old female with asymmetric left knee effusion.  She has radiographs taken today demonstrating no significant degenerative changes or any acute osseous abnormality.  We discussed options available to patient.  With her history that she provides, suspect she may have first-time gout attack or may be degenerative meniscal tear.  We  will plan for aspiration of the left knee and cortisone injection today since this provided great relief for her trochanteric pain.  Left knee was aspirated of 7cc of nonpurulent synovial fluid and cortisone injection administered successfully without complication.  Due to the small volume and color of the fluid, do not think we need to send for synovial crystal analysis.  We will see how she does with cortisone injection.  If her symptoms fail to improve, she will let us  know and the next step would be MRI versus formal physical therapy.  AP, lateral, sunrise views of left knee reviewed.  No significant degenerative changes noted.  No fracture or dislocation.  No abnormal patellar height.  Follow-Up Instructions: No follow-ups on file.   Orders:  Orders Placed This Encounter  Procedures   DG Knee AP/LAT W/Sunrise Left   No orders of the defined types were placed in this encounter.     Procedures: Large Joint Inj: L knee on 11/14/2023 9:34 AM Indications: diagnostic evaluation, joint swelling and pain Details: 18 G 1.5 in needle, superolateral approach  Arthrogram: No  Medications: 5 mL lidocaine  1 %; 4 mL bupivacaine  0.25 %; 40 mg triamcinolone  acetonide 40 MG/ML Aspirate: 7 mL Outcome:  tolerated well, no immediate complications Procedure, treatment alternatives, risks and benefits explained, specific risks discussed. Consent was given by the patient. Immediately prior to procedure a time out was called to verify the correct patient, procedure, equipment, support staff and site/side marked as required. Patient was prepped and draped in the usual sterile fashion.       Clinical Data: No additional findings.  Objective: Vital Signs: There were no vitals taken for this visit.  Physical Exam:  Constitutional: Patient appears well-developed HEENT:  Head: Normocephalic Eyes:EOM are normal Neck: Normal range of motion Cardiovascular: Normal rate Pulmonary/chest: Effort  normal Neurologic: Patient is alert Skin: Skin is warm Psychiatric: Patient has normal mood and affect  Ortho Exam: Ortho exam demonstrates left knee with mild effusion which is asymmetric compared with no effusion in the right knee.  Slight increased warmth in the left knee relative to the right.  Palpable DP pulse and PT pulse of the left lower extremity.  Able to perform straight leg raise without extensor lag.  No pain with hip range of motion.  She does have tenderness over the medial and lateral joint lines.  No significant crepitus noted with passive motion of the knee.  No calf tenderness.  Negative Homans' sign.  Has full range of motion from 0 degrees extension to about 125 degrees of knee flexion though pain is worse with knee flexion past about 80 degrees.  Specialty Comments:  No specialty comments available.  Imaging: No results found.   PMFS History: Patient Active Problem List   Diagnosis Date Noted   Diabetes mellitus (HCC) 10/23/2023   Chronic left hip pain 07/17/2023   GAD (generalized anxiety disorder) 06/19/2023   Basal cell carcinoma 10/19/2022   Trochanteric bursitis, left hip 08/03/2022   History of right breast cancer 04/18/2022   Lumbar radiculopathy 03/10/2021   Low back pain 02/17/2021   Back pain with sciatica 11/16/2020   Ductal carcinoma in situ (DCIS) of right breast 10/26/2020   Hyperlipidemia 08/08/2018   Osteoarthritis 10/12/2016   Osteopenia 09/06/2016   Obstructive sleep apnea 12/23/2013   Essential hypertension, benign    Hiatal hernia    Diverticulosis of colon 12/30/2010   Gastritis, chronic 12/30/2010   IBS (irritable bowel syndrome) 08/25/2010   GERD (gastroesophageal reflux disease) 08/25/2010   Past Medical History:  Diagnosis Date   Barrett esophagus    Benign neoplasm of other and unspecified site of the digestive system 04/29/2007   Breast cancer (HCC) 10/13/2020   Chronic bronchitis (HCC)    Depression    Diverticulosis of  colon (without mention of hemorrhage)    Esophageal reflux    Essential hypertension, benign    Gastritis    Grave's disease    Hiatal hernia    Hypothyroidism    IBS (irritable bowel syndrome)    Obesity    Osteopenia    Personal history of radiation therapy    Sleep apnea    Symptomatic menopausal or female climacteric states     Family History  Problem Relation Age of Onset   Hypertension Mother    Arthritis Mother    Osteoporosis Mother    Kidney disease Mother        related to APAP use   Lung cancer Father    Hypertension Brother    Hypertension Daughter    Diabetes Daughter    Diabetes Son    Diabetes Other    Clotting disorder Other    Colon cancer Neg Hx  Past Surgical History:  Procedure Laterality Date   ABDOMINAL HYSTERECTOMY N/A    BREAST BIOPSY Right 09/22/2020   BREAST LUMPECTOMY Right 10/13/2020   BREAST LUMPECTOMY WITH RADIOACTIVE SEED LOCALIZATION Right 10/13/2020   Procedure: RIGHT BREAST LUMPECTOMY WITH RADIOACTIVE SEED LOCALIZATION;  Surgeon: Ebbie Cough, MD;  Location: [facility];  Service: General;  Laterality: Right;   CHOLECYSTECTOMY     INCONTINENCE SURGERY     MOHS SURGERY     TONSILLECTOMY     Social History   Occupational History   Occupation: Hospice- Chaplain    Comment: [place]   Occupation: retired  Tobacco Use   Smoking status: Former    Current packs/day: 0.00    Average packs/day: 0.5 packs/day for 5.0 years (2.5 ttl pk-yrs)    Types: Cigarettes    Start date: 01/24/1963    Quit date: 01/24/1968    Years since quitting: 55.8   Smokeless tobacco: Never  Vaping Use   Vaping status: Never Used  Substance and Sexual Activity   Alcohol use: No    Alcohol/week: 0.0 standard drinks of alcohol   Drug use: No   Sexual activity: Yes

## 2023-11-26 ENCOUNTER — Encounter: Payer: Self-pay | Admitting: Radiology

## 2024-01-07 ENCOUNTER — Ambulatory Visit: Payer: Self-pay

## 2024-01-07 NOTE — Telephone Encounter (Signed)
 FYI Only or Action Required?: FYI only for provider: ED advised.  Patient was last seen in primary care on 10/23/2023 by Lavell Bari LABOR, FNP.  Called Nurse Triage reporting Knee Pain, Leg Swelling, and Shortness of Breath.  Symptoms began several weeks ago.  Interventions attempted: Nothing.  Symptoms are: gradually worsening.  Triage Disposition: Go to ED Now (or PCP Triage)  Patient/caregiver understands and will follow disposition?: Yes                                  1. ONSET: When did the swelling start? (e.g., minutes, hours, days)     3 weeks ago 2. LOCATION: What part of the leg is swollen?  Are both legs swollen or just one leg?     Bilateral legs from ankles to knees, extends past knee at times on left side 3. SEVERITY: How bad is the swelling? (e.g., localized; mild, moderate, severe)     Moderate to severe 4. REDNESS: Is there redness or signs of infection?     Denies 5. PAIN: Is the swelling painful to touch? If Yes, ask: How painful is it?   (Scale 1-10; mild, moderate or severe)     Reports left knee pain 6. FEVER: Do you have a fever? If Yes, ask: What is it, how was it measured, and when did it start?      Denies 7. CAUSE: What do you think is causing the leg swelling?     Unsure 8. MEDICAL HISTORY: Do you have a history of blood clots (e.g., DVT), cancer, heart failure, kidney disease, or liver failure?     Denies CHF, denies previous heart attack 10. OTHER SYMPTOMS: Do you have any other symptoms? (e.g., chest pain, difficulty breathing)     Reports SOB upon exertion that has been gradually worsening Denies chest pain, denies difficulty breathing at this time    Patient originally called in to report left knee pain and swelling. Upon further assessment, patient reported bilateral leg swelling up to the knees and worsening SOB upon exertion. This RN advised ED. Patient verbalized understanding and stated  she would contact her husband to take her.   Copied from CRM #[id number]. Topic: Clinical - Red Word Triage >> Jan 07, 2024  1:55 PM Avram MATSU wrote: Red Word that prompted transfer to Nurse Triage: left knee/leg swollen and painful. ----------------------------------------------------------------------- From previous Reason for Contact - Scheduling: Patient/patient representative is calling to schedule an appointment. Refer to attachments for appointment information.  Reason for Disposition  [1] Difficulty breathing with exertion (e.g., walking) and [2] NEW or getting WORSE  Protocols used: Leg Swelling and Edema-A-AH

## 2024-01-07 NOTE — Telephone Encounter (Signed)
 FYI noted

## 2024-01-09 ENCOUNTER — Ambulatory Visit: Admitting: Surgical

## 2024-01-09 DIAGNOSIS — M25562 Pain in left knee: Secondary | ICD-10-CM

## 2024-01-09 DIAGNOSIS — M25462 Effusion, left knee: Secondary | ICD-10-CM | POA: Diagnosis not present

## 2024-01-10 ENCOUNTER — Encounter: Payer: Self-pay | Admitting: Surgical

## 2024-01-10 NOTE — Progress Notes (Cosign Needed)
 Office Visit Note   Patient: Laura James           Date of Birth: 1950/02/28           MRN: [id number] Visit Date: 01/09/2024 Requested by: Lavell Bari LABOR, FNP [address] [place],  [place] [address] PCP: Lavell Bari LABOR, FNP  Subjective: Chief Complaint  Patient presents with   bilateral knee pain    HPI: Laura James is a 73 y.o. female who presents to the office reporting left knee pain primarily.  Patient had left knee aspiration and injection with cortisone on 11/14/2023 that gave her about 2 weeks of relief.  Unfortunately, pain has recurred and is back to how she was feeling prior to injection.  She describes swelling and pain that is specifically worse with ascending/descending stairs.  She has difficulty weightbearing for any long length of time.  Tried taking Celebrex  without relief.  No groin pain.  No radiating pain.  Does have clicking and popping sensation in the left knee.  Has started having some right knee pain as well that she suspects is from overcompensating and trying to shift her weight to her right leg..                ROS: All systems reviewed are negative as they relate to the chief complaint within the history of present illness.  Patient denies fevers or chills.  Assessment & Plan: Visit Diagnoses:  1. Acute pain of left knee   2. Effusion, left knee     Plan: Impression is 73 year old female who has left knee pain that has been ongoing for several months.  Briefly was made better with injection on 11/14/2023 but this only lasted about 2 weeks.  With her negative radiographs and severe life altering pain, next step is MRI of the left knee for further evaluation of occult arthritis versus meniscal pathology.  Follow-Up Instructions: No follow-ups on file.   Orders:  Orders Placed This Encounter  Procedures   MR Knee Left  Wo Contrast   No orders of the defined types were placed in this encounter.     Procedures: No procedures  performed   Clinical Data: No additional findings.  Objective: Vital Signs: There were no vitals taken for this visit.  Physical Exam:  Constitutional: Patient appears well-developed HEENT:  Head: Normocephalic Eyes:EOM are normal Neck: Normal range of motion Cardiovascular: Normal rate Pulmonary/chest: Effort normal Neurologic: Patient is alert Skin: Skin is warm Psychiatric: Patient has normal mood and affect  Ortho Exam: Ortho exam demonstrates left knee with positive effusion.  She has exquisite tenderness over the medial joint line.  No tenderness over the lateral joint line.  Excellent range of motion with 0 degrees extension and 120 degrees knee flexion.  Terminal knee flexion does reproduce her pain.  Able to perform straight leg raise without extensor lag.  No pain with hip range of motion.  Stable to anterior posterior drawer sign.  Specialty Comments:  No specialty comments available.  Imaging: No results found.   PMFS History: Patient Active Problem List   Diagnosis Date Noted   Diabetes mellitus (HCC) 10/23/2023   Chronic left hip pain 07/17/2023   GAD (generalized anxiety disorder) 06/19/2023   Basal cell carcinoma 10/19/2022   Trochanteric bursitis, left hip 08/03/2022   History of right breast cancer 04/18/2022   Lumbar radiculopathy 03/10/2021   Low back pain 02/17/2021   Back pain with sciatica 11/16/2020   Ductal carcinoma in  situ (DCIS) of right breast 10/26/2020   Hyperlipidemia 08/08/2018   Osteoarthritis 10/12/2016   Osteopenia 09/06/2016   Obstructive sleep apnea 12/23/2013   Essential hypertension, benign    Hiatal hernia    Diverticulosis of colon 12/30/2010   Gastritis, chronic 12/30/2010   IBS (irritable bowel syndrome) 08/25/2010   GERD (gastroesophageal reflux disease) 08/25/2010   Past Medical History:  Diagnosis Date   Barrett esophagus    Benign neoplasm of other and unspecified site of the digestive system 04/29/2007   Breast  cancer (HCC) 10/13/2020   Chronic bronchitis (HCC)    Depression    Diverticulosis of colon (without mention of hemorrhage)    Esophageal reflux    Essential hypertension, benign    Gastritis    Grave's disease    Hiatal hernia    Hypothyroidism    IBS (irritable bowel syndrome)    Obesity    Osteopenia    Personal history of radiation therapy    Sleep apnea    Symptomatic menopausal or female climacteric states     Family History  Problem Relation Age of Onset   Hypertension Mother    Arthritis Mother    Osteoporosis Mother    Kidney disease Mother        related to APAP use   Lung cancer Father    Hypertension Brother    Hypertension Daughter    Diabetes Daughter    Diabetes Son    Diabetes Other    Clotting disorder Other    Colon cancer Neg Hx     Past Surgical History:  Procedure Laterality Date   ABDOMINAL HYSTERECTOMY N/A    BREAST BIOPSY Right 09/22/2020   BREAST LUMPECTOMY Right 10/13/2020   BREAST LUMPECTOMY WITH RADIOACTIVE SEED LOCALIZATION Right 10/13/2020   Procedure: RIGHT BREAST LUMPECTOMY WITH RADIOACTIVE SEED LOCALIZATION;  Surgeon: Ebbie Cough, MD;  Location: [facility];  Service: General;  Laterality: Right;   CHOLECYSTECTOMY     INCONTINENCE SURGERY     MOHS SURGERY     TONSILLECTOMY     Social History   Occupational History   Occupation: Hospice- Chaplain    Comment: [place]   Occupation: retired  Tobacco Use   Smoking status: Former    Current packs/day: 0.00    Average packs/day: 0.5 packs/day for 5.0 years (2.5 ttl pk-yrs)    Types: Cigarettes    Start date: 01/24/1963    Quit date: 01/24/1968    Years since quitting: 56.0   Smokeless tobacco: Never  Vaping Use   Vaping status: Never Used  Substance and Sexual Activity   Alcohol use: No    Alcohol/week: 0.0 standard drinks of alcohol   Drug use: No   Sexual activity: Yes

## 2024-01-11 ENCOUNTER — Ambulatory Visit (HOSPITAL_COMMUNITY)
Admission: RE | Admit: 2024-01-11 | Discharge: 2024-01-11 | Disposition: A | Discharge status: Home / Self Care | Source: Ambulatory Visit | Attending: Surgical | Admitting: Surgical

## 2024-01-11 DIAGNOSIS — M25562 Pain in left knee: Secondary | ICD-10-CM | POA: Diagnosis present

## 2024-01-11 DIAGNOSIS — M25462 Effusion, left knee: Secondary | ICD-10-CM | POA: Insufficient documentation

## 2024-01-28 ENCOUNTER — Telehealth: Payer: Self-pay | Admitting: Surgical

## 2024-01-28 DIAGNOSIS — J4521 Mild intermittent asthma with (acute) exacerbation: Secondary | ICD-10-CM

## 2024-01-28 NOTE — Telephone Encounter (Signed)
 Patient called. She would like to know if she could get the results of her MRI? Or talk to [facility] about the MRI?

## 2024-01-29 ENCOUNTER — Telehealth: Payer: Self-pay | Admitting: Surgical

## 2024-01-29 NOTE — Telephone Encounter (Signed)
 Pt called saying that the nurse or Herlene would call her to go over the results of her MRI. Call back number is [phone] until 3:00 pm.

## 2024-01-29 NOTE — Telephone Encounter (Signed)
Called, left VM.  Will call back tomorrow.

## 2024-01-30 ENCOUNTER — Other Ambulatory Visit: Payer: Self-pay | Admitting: Radiology

## 2024-01-30 ENCOUNTER — Ambulatory Visit: Payer: Self-pay | Admitting: Surgical

## 2024-01-30 DIAGNOSIS — M25562 Pain in left knee: Secondary | ICD-10-CM

## 2024-01-30 DIAGNOSIS — M25462 Effusion, left knee: Secondary | ICD-10-CM

## 2024-01-30 NOTE — Progress Notes (Signed)
"  Referral entered.   "

## 2024-01-30 NOTE — Progress Notes (Signed)
 Laura James, I called [name].  We discussed possibilities for her continued knee pain with fairly inconclusive MRI scan given her primarily medial pain that only responded to injection for about 2 weeks.  We discussed PT versus diagnostic arthroscopy versus gel injection.  She would like to try physical therapy.  Can you send a referral to see Brian/Austin in [place] to evaluate and treat?

## 2024-02-13 ENCOUNTER — Ambulatory Visit: Attending: Surgical

## 2024-02-13 DIAGNOSIS — M25462 Effusion, left knee: Secondary | ICD-10-CM | POA: Diagnosis not present

## 2024-02-13 DIAGNOSIS — M6281 Muscle weakness (generalized): Secondary | ICD-10-CM | POA: Diagnosis present

## 2024-02-13 DIAGNOSIS — M25562 Pain in left knee: Secondary | ICD-10-CM | POA: Diagnosis present

## 2024-02-13 NOTE — Therapy (Signed)
 " OUTPATIENT PHYSICAL THERAPY EVALUATION   Patient Name: Laura James MRN: [id number] DOB:10/21/1950, 74 y.o., female Today's Date: 02/13/2024  END OF SESSION:  PT End of Session - 02/13/24 1353     Visit Number 1    Number of Visits 12    Date for Recertification  03/26/24    Authorization Type [employer] Medicare    PT Start Time 1348    PT Stop Time 1430    PT Time Calculation (min) 42 min    Activity Tolerance Patient tolerated treatment well    Behavior During Therapy WFL for tasks assessed/performed          Past Medical History:  Diagnosis Date   Barrett esophagus    Benign neoplasm of other and unspecified site of the digestive system 04/29/2007   Breast cancer (HCC) 10/13/2020   Chronic bronchitis (HCC)    Depression    Diverticulosis of colon (without mention of hemorrhage)    Esophageal reflux    Essential hypertension, benign    Gastritis    Grave's disease    Hiatal hernia    Hypothyroidism    IBS (irritable bowel syndrome)    Obesity    Osteopenia    Personal history of radiation therapy    Sleep apnea    Symptomatic menopausal or female climacteric states    Past Surgical History:  Procedure Laterality Date   ABDOMINAL HYSTERECTOMY N/A    BREAST BIOPSY Right 09/22/2020   BREAST LUMPECTOMY Right 10/13/2020   BREAST LUMPECTOMY WITH RADIOACTIVE SEED LOCALIZATION Right 10/13/2020   Procedure: RIGHT BREAST LUMPECTOMY WITH RADIOACTIVE SEED LOCALIZATION;  Surgeon: Ebbie Cough, MD;  Location: [facility];  Service: General;  Laterality: Right;   CHOLECYSTECTOMY     INCONTINENCE SURGERY     MOHS SURGERY     TONSILLECTOMY     Patient Active Problem List   Diagnosis Date Noted   Diabetes mellitus (HCC) 10/23/2023   Chronic left hip pain 07/17/2023   GAD (generalized anxiety disorder) 06/19/2023   Basal cell carcinoma 10/19/2022   Trochanteric bursitis, left hip 08/03/2022   History of right breast cancer 04/18/2022   Lumbar  radiculopathy 03/10/2021   Low back pain 02/17/2021   Back pain with sciatica 11/16/2020   Ductal carcinoma in situ (DCIS) of right breast 10/26/2020   Hyperlipidemia 08/08/2018   Osteoarthritis 10/12/2016   Osteopenia 09/06/2016   Obstructive sleep apnea 12/23/2013   Essential hypertension, benign    Hiatal hernia    Diverticulosis of colon 12/30/2010   Gastritis, chronic 12/30/2010   IBS (irritable bowel syndrome) 08/25/2010   GERD (gastroesophageal reflux disease) 08/25/2010    PCP: Lavell Bari LABOR, FNP   REFERRING PROVIDER: Shirly Carlin CROME PA-C  REFERRING DIAG:  [phone] (ICD-10-CM) - Acute pain of left knee  M25.462 (ICD-10-CM) - Effusion, left knee    Rationale for Evaluation and Treatment:  Rehabiliation  THERAPY DIAG: Acute pain in left knee, muscle weakness   ONSET DATE: 07/2023   SUBJECTIVE:  SUBJECTIVE STATEMENT: Pt reports L medial knee pain with insidious onset. She reports ongoing swelling and limited mobility to transfer from and chair, vehicle and negotiate steps. She has received an injection that lasted for 2 weeks but is now increased in pain. Her goal is to return to basic exercise and water aerobics.   NEXT MD VISIT: 03/12/24  PERTINENT HISTORY:  See above PMH  PAIN:  NPRS scale: 6/10 upon arrival Pain location: L Knee medial Pain description: constant, dull, achy, sharp, stiff Aggravating factors: weightbearing, steps,   Relieving factors: rest, meds   PRECAUTIONS: ,  None  RED FLAGS: None   WEIGHT BEARING RESTRICTIONS:  No  FALLS:  Has patient fallen in last 6 months? No   OCCUPATION:  Retired; [employer]   PLOF:  Independent  PATIENT GOALS:  Return to water aerobics and ADLs with less pain  OBJECTIVE:  Note: Objective measures were  completed at Evaluation unless otherwise noted.  DIAGNOSTIC FINDINGS:  1. Degeneration of the lateral meniscus with horizontal tear of the body and posterior horn extending to the posterior root attachment. 2. Mild partial-thickness cartilage loss of the medial and lateral femorotibial compartments. 3. Small knee joint effusion. 4. Small ruptured Baker's cyst.  PATIENT SURVEYS:   Patient-Specific Activity Scoring Scheme  0 represents unable to perform. 10 represents able to perform at prior level. 0 1 2 3 4 5 6 7 8 9  10 (Date and Score)   Activity Eval     Steps 4     2.   Sit to stand transfers  6    3.  Vehicle transfer  4   4.    5.    Score 4.67    Total score = sum of the activity scores/number of activities Minimum detectable change (90%CI) for average score = 2 points Minimum detectable change (90%CI) for single activity score = 3 points     EDEMA:  Yes; L knee  POSTURE:  No postural limitations  GAIT: Assistive device utilized: None Level of assistance: Complete Independence Comments: Antalgic with dec stance time a LLE    LOWER EXTREMITY ROM:     Active  Right eval Left eval  Hip flexion    Hip extension    Hip abduction    Hip adduction    Hip internal rotation    Hip external rotation    Knee flexion 100 125  Knee extension 8 10  Ankle dorsiflexion    Ankle plantarflexion    Ankle inversion    Ankle eversion     (Blank rows = not tested)   LOWER EXTREMITY MMT:    MMT Right eval Left eval  Hip flexion 4 4+  Hip extension    Hip abduction 4 4+  Hip adduction 4 4+  Hip internal rotation    Hip external rotation    Knee flexion 3+ 5  Knee extension 3+ 5  Ankle dorsiflexion    Ankle plantarflexion    Ankle inversion    Ankle eversion     (Blank rows = not tested)     FUNCTIONAL TESTS:  5 times sit to stand: 22.28s  TREATMENT DATE:  Eval HEP creation and review with demonstration and trial set preformed, see below for details Selfcare:see education section below    PATIENT EDUCATION: Education details: HEP, PT plan of care, selfcare Person educated: Patient Education method: Explanation, Demonstration, Verbal cues, and Handouts Education comprehension: verbalized understanding, further education recommended   HOME EXERCISE PROGRAM: Access Code: [email or web address] URL: [email or web address] Date: 02/13/2024 Prepared by: Massie Ada  Exercises - Supine Ankle Pumps in Elevation on Pillows  - 1 x daily - 7 x weekly - 3 sets - 10 reps - Supine Heel Slide with Strap  - 1 x daily - 7 x weekly - 3 sets - 10 reps - Mini Squat with Counter Support  - 1 x daily - 7 x weekly - 3 sets - 10 reps - Recumbent Bike  - 1 x daily - 7 x weekly - 3 sets - 10 reps  ASSESSMENT:  CLINICAL IMPRESSION: Patient referred to PT for left knee pain with effusion. Patient will benefit from skilled PT to improve overall function and to address impairments and limitations listed below.  OBJECTIVE IMPAIRMENTS: decreased activity tolerance for ADL's, difficulty walking, decreased balance, decreased endurance, decreased mobility, decreased ROM, decreased strength, impaired flexibility, impaired LE use, and pain.  ACTIVITY LIMITATIONS: bending, liftting, walking, standing, cleaning, community activity, driving, reaching, carry, occupation  PERSONAL FACTORS: see above PMH  also affecting patient's functional outcome.  REHAB POTENTIAL: Fair    CLINICAL DECISION MAKING: Stable/uncomplicated  EVALUATION COMPLEXITY: Low    GOALS: Short term PT Goals Target date: 03/12/2024   Pt will be I and compliant with HEP. Baseline: No HEP Goal status: New Pt will decrease pain by 25% overall Baseline:6/10 Goal status: New  Long term PT goals Target date: 03/26/2024   Pt will improve ROM to [facility] to  improve functional mobility Baseline: limited 25% Goal status: New Pt will improve  strength to at least 4+/5 MMT to improve functional strength Baseline:3+/5 Goal status: New Pt will improve Patient specific functional scale (PSFS) to at least 7/10 to show improved function level Baseline: Goal status: New Pt will reduce pain to overall less than 3/10 with usual activity and work activity. Baseline:6/10 Goal status: New Pt will be able to ambulate community distances at least 1000 ft WNL gait pattern without complaints Baseline:antalgic Goal status: New Pt will improve 5 times sit to stand time to less than 13 seconds to show improved strength, balance.  Baseline:22.28s Goal status: New  PLAN: PT FREQUENCY: 1-3 times per week   PT DURATION: 6-8 weeks  PLANNED INTERVENTIONS  97110-Therapeutic exercises, 97530- Therapeutic activity, [id number]- Neuromuscular re-education, 97535- Self Care, [id number]- Manual therapy, [id number]- Vasopneumatic device, and Patient/Family education  PLAN FOR NEXT SESSION: Initiate LE strength and Rom as tolerated   Massie Ada PT, DPT 02/13/24 3:54 PM   "

## 2024-02-21 ENCOUNTER — Ambulatory Visit

## 2024-02-21 DIAGNOSIS — M25562 Pain in left knee: Secondary | ICD-10-CM

## 2024-02-21 DIAGNOSIS — M6281 Muscle weakness (generalized): Secondary | ICD-10-CM

## 2024-02-21 NOTE — Therapy (Signed)
 " OUTPATIENT PHYSICAL THERAPY TREATMENT   Patient Name: Laura James MRN: [id number] DOB:24-Sep-1950, 74 y.o., female Today's Date: 02/21/2024  END OF SESSION:  PT End of Session - 02/21/24 0842     Visit Number 2    Number of Visits 12    Date for Recertification  03/26/24    Authorization Type [employer] Medicare    PT Start Time 0835    PT Stop Time 0928    PT Time Calculation (min) 53 min    Activity Tolerance Patient tolerated treatment well    Behavior During Therapy [facility] for tasks assessed/performed          Past Medical History:  Diagnosis Date   Barrett esophagus    Benign neoplasm of other and unspecified site of the digestive system 04/29/2007   Breast cancer (HCC) 10/13/2020   Chronic bronchitis (HCC)    Depression    Diverticulosis of colon (without mention of hemorrhage)    Esophageal reflux    Essential hypertension, benign    Gastritis    Grave's disease    Hiatal hernia    Hypothyroidism    IBS (irritable bowel syndrome)    Obesity    Osteopenia    Personal history of radiation therapy    Sleep apnea    Symptomatic menopausal or female climacteric states    Past Surgical History:  Procedure Laterality Date   ABDOMINAL HYSTERECTOMY N/A    BREAST BIOPSY Right 09/22/2020   BREAST LUMPECTOMY Right 10/13/2020   BREAST LUMPECTOMY WITH RADIOACTIVE SEED LOCALIZATION Right 10/13/2020   Procedure: RIGHT BREAST LUMPECTOMY WITH RADIOACTIVE SEED LOCALIZATION;  Surgeon: Ebbie Cough, MD;  Location: [facility];  Service: General;  Laterality: Right;   CHOLECYSTECTOMY     INCONTINENCE SURGERY     MOHS SURGERY     TONSILLECTOMY     Patient Active Problem List   Diagnosis Date Noted   Diabetes mellitus (HCC) 10/23/2023   Chronic left hip pain 07/17/2023   GAD (generalized anxiety disorder) 06/19/2023   Basal cell carcinoma 10/19/2022   Trochanteric bursitis, left hip 08/03/2022   History of right breast cancer 04/18/2022   Lumbar radiculopathy  03/10/2021   Low back pain 02/17/2021   Back pain with sciatica 11/16/2020   Ductal carcinoma in situ (DCIS) of right breast 10/26/2020   Hyperlipidemia 08/08/2018   Osteoarthritis 10/12/2016   Osteopenia 09/06/2016   Obstructive sleep apnea 12/23/2013   Essential hypertension, benign    Hiatal hernia    Diverticulosis of colon 12/30/2010   Gastritis, chronic 12/30/2010   IBS (irritable bowel syndrome) 08/25/2010   GERD (gastroesophageal reflux disease) 08/25/2010    PCP: Lavell Bari LABOR, FNP   REFERRING PROVIDER: Shirly Carlin CROME PA-C  REFERRING DIAG:  [phone] (ICD-10-CM) - Acute pain of left knee  M25.462 (ICD-10-CM) - Effusion, left knee    Rationale for Evaluation and Treatment:  Rehabiliation  THERAPY DIAG: Acute pain in left knee, muscle weakness   ONSET DATE: 07/2023   SUBJECTIVE:  SUBJECTIVE STATEMENT:  Pt reports continued pain and swelling. She reports 6/10 pain entering clinic. She is compliant with HEP. States she is good moving forward but turning causes inc in pain.   EVAL Pt reports L medial knee pain with insidious onset. She reports ongoing swelling and limited mobility to transfer from and chair, vehicle and negotiate steps. She has received an injection that lasted for 2 weeks but is now increased in pain. Her goal is to return to basic exercise and water aerobics.   NEXT MD VISIT: 03/12/24  PERTINENT HISTORY:  See above PMH  PAIN:  NPRS scale: 6/10 upon arrival Pain location: L Knee medial Pain description: constant, dull, achy, sharp, stiff Aggravating factors: weightbearing, steps,   Relieving factors: rest, meds   PRECAUTIONS: ,  None  RED FLAGS: None   WEIGHT BEARING RESTRICTIONS:  No  FALLS:  Has patient fallen in last 6 months?  No   OCCUPATION:  Retired; [employer]   PLOF:  Independent  PATIENT GOALS:  Return to water aerobics and ADLs with less pain  OBJECTIVE:  Note: Objective measures were completed at Evaluation unless otherwise noted.  DIAGNOSTIC FINDINGS:  1. Degeneration of the lateral meniscus with horizontal tear of the body and posterior horn extending to the posterior root attachment. 2. Mild partial-thickness cartilage loss of the medial and lateral femorotibial compartments. 3. Small knee joint effusion. 4. Small ruptured Baker's cyst.  PATIENT SURVEYS:   Patient-Specific Activity Scoring Scheme  0 represents unable to perform. 10 represents able to perform at prior level. 0 1 2 3 4 5 6 7 8 9  10 (Date and Score)   Activity Eval     Steps 4     2.   Sit to stand transfers  6    3.  Vehicle transfer  4   4.    5.    Score 4.67    Total score = sum of the activity scores/number of activities Minimum detectable change (90%CI) for average score = 2 points Minimum detectable change (90%CI) for single activity score = 3 points     EDEMA:  Yes; L knee  POSTURE:  No postural limitations  GAIT: Assistive device utilized: None Level of assistance: Complete Independence Comments: Antalgic with dec stance time a LLE    LOWER EXTREMITY ROM:     Active  Right eval Left eval  Hip flexion    Hip extension    Hip abduction    Hip adduction    Hip internal rotation    Hip external rotation    Knee flexion 100 125  Knee extension 8 10  Ankle dorsiflexion    Ankle plantarflexion    Ankle inversion    Ankle eversion     (Blank rows = not tested)   LOWER EXTREMITY MMT:    MMT Right eval Left eval  Hip flexion 4 4+  Hip extension    Hip abduction 4 4+  Hip adduction 4 4+  Hip internal rotation    Hip external rotation    Knee flexion 3+ 5  Knee extension 3+ 5  Ankle dorsiflexion    Ankle plantarflexion    Ankle inversion    Ankle eversion     (Blank  rows = not tested)     FUNCTIONAL TESTS:  5 times sit to stand: 22.28s  TREATMENT DATE:    02/21/24  Recumbent bike x 10 min L1  Mini Squats 3 x 10 reps cues for form Fwd/lateral Step ups  2 x 10 reps LLE    Lateral band walk 3 x 10 feet red  Seated knee extension 3# 3 x 10 reps Seated leg curl 3 x 10 gtb Updated HEP and provided patient education on s/s related to meniscus tear.      Eval HEP creation and review with demonstration and trial set preformed, see below for details Selfcare:see education section below    PATIENT EDUCATION: Education details: HEP, PT plan of care, selfcare Person educated: Patient Education method: Explanation, Demonstration, Verbal cues, and Handouts Education comprehension: verbalized understanding, further education recommended   HOME EXERCISE PROGRAM: Access Code: [email or web address] URL: [email or web address] Date: 02/21/2024 Prepared by: Massie Ada  Exercises - Supine Ankle Pumps in Elevation on Pillows  - 1 x daily - 7 x weekly - 3 sets - 10 reps - Supine Heel Slide with Strap  - 1 x daily - 7 x weekly - 3 sets - 10 reps - Mini Squat with Counter Support  - 1 x daily - 7 x weekly - 3 sets - 10 reps - Recumbent Bike  - 1 x daily - 7 x weekly - 3 sets - 10 reps - Side Stepping with Resistance at Ankles  - 1 x daily - 7 x weekly - 3 sets - 10 reps  ASSESSMENT:  CLINICAL IMPRESSION:  Pt tolerated session well without adverse reaction or increase pain. Pt required verbal cues and demonstration to improve squat form. Pt will continue to benefit from skilled therapy in order to address impairments.    EVAL Patient referred to PT for left knee pain with effusion. Patient will benefit from skilled PT to improve overall function and to address impairments and limitations listed below.  OBJECTIVE  IMPAIRMENTS: decreased activity tolerance for ADL's, difficulty walking, decreased balance, decreased endurance, decreased mobility, decreased ROM, decreased strength, impaired flexibility, impaired LE use, and pain.  ACTIVITY LIMITATIONS: bending, liftting, walking, standing, cleaning, community activity, driving, reaching, carry, occupation  PERSONAL FACTORS: see above PMH  also affecting patient's functional outcome.  REHAB POTENTIAL: Fair    CLINICAL DECISION MAKING: Stable/uncomplicated  EVALUATION COMPLEXITY: Low    GOALS: Short term PT Goals Target date: 03/12/2024   Pt will be I and compliant with HEP. Baseline: No HEP Goal status: New Pt will decrease pain by 25% overall Baseline:6/10 Goal status: New  Long term PT goals Target date: 03/26/2024   Pt will improve ROM to [facility] to improve functional mobility Baseline: limited 25% Goal status: New Pt will improve  strength to at least 4+/5 MMT to improve functional strength Baseline:3+/5 Goal status: New Pt will improve Patient specific functional scale (PSFS) to at least 7/10 to show improved function level Baseline: Goal status: New Pt will reduce pain to overall less than 3/10 with usual activity and work activity. Baseline:6/10 Goal status: New Pt will be able to ambulate community distances at least 1000 ft WNL gait pattern without complaints Baseline:antalgic Goal status: New Pt will improve 5 times sit to stand time to less than 13 seconds to show improved strength, balance.  Baseline:22.28s Goal status: New  PLAN: PT FREQUENCY: 1-3 times per week   PT DURATION: 6-8 weeks  PLANNED INTERVENTIONS  97110-Therapeutic exercises, 97530- Therapeutic activity, [id number]- Neuromuscular re-education, 97535- Self Care, 02859- Manual therapy, 97016- Vasopneumatic device, and Patient/Family education  PLAN FOR NEXT SESSION: Initiate  LE strength and Rom as tolerated   Massie Ada PT, DPT 02/21/24 9:33 AM   "

## 2024-02-28 ENCOUNTER — Ambulatory Visit

## 2024-02-28 DIAGNOSIS — M6281 Muscle weakness (generalized): Secondary | ICD-10-CM

## 2024-02-28 DIAGNOSIS — M25562 Pain in left knee: Secondary | ICD-10-CM

## 2024-02-28 NOTE — Therapy (Signed)
 " OUTPATIENT PHYSICAL THERAPY TREATMENT   Patient Name: Laura James MRN: [id number] DOB:1950/05/14, 74 y.o., female Today's Date: 02/28/2024  END OF SESSION:  PT End of Session - 02/28/24 1149     Visit Number 3    Number of Visits 12    Date for Recertification  03/26/24    Authorization Type [employer]    PT Start Time 1138    PT Stop Time 1223    PT Time Calculation (min) 45 min    Activity Tolerance Patient tolerated treatment well    Behavior During Therapy WFL for tasks assessed/performed          Past Medical History:  Diagnosis Date   Barrett esophagus    Benign neoplasm of other and unspecified site of the digestive system 04/29/2007   Breast cancer (HCC) 10/13/2020   Chronic bronchitis (HCC)    Depression    Diverticulosis of colon (without mention of hemorrhage)    Esophageal reflux    Essential hypertension, benign    Gastritis    Grave's disease    Hiatal hernia    Hypothyroidism    IBS (irritable bowel syndrome)    Obesity    Osteopenia    Personal history of radiation therapy    Sleep apnea    Symptomatic menopausal or female climacteric states    Past Surgical History:  Procedure Laterality Date   ABDOMINAL HYSTERECTOMY N/A    BREAST BIOPSY Right 09/22/2020   BREAST LUMPECTOMY Right 10/13/2020   BREAST LUMPECTOMY WITH RADIOACTIVE SEED LOCALIZATION Right 10/13/2020   Procedure: RIGHT BREAST LUMPECTOMY WITH RADIOACTIVE SEED LOCALIZATION;  Surgeon: Ebbie Cough, MD;  Location: [facility];  Service: General;  Laterality: Right;   CHOLECYSTECTOMY     INCONTINENCE SURGERY     MOHS SURGERY     TONSILLECTOMY     Patient Active Problem List   Diagnosis Date Noted   Diabetes mellitus (HCC) 10/23/2023   Chronic left hip pain 07/17/2023   GAD (generalized anxiety disorder) 06/19/2023   Basal cell carcinoma 10/19/2022   Trochanteric bursitis, left hip 08/03/2022   History of right breast cancer 04/18/2022   Lumbar radiculopathy  03/10/2021   Low back pain 02/17/2021   Back pain with sciatica 11/16/2020   Ductal carcinoma in situ (DCIS) of right breast 10/26/2020   Hyperlipidemia 08/08/2018   Osteoarthritis 10/12/2016   Osteopenia 09/06/2016   Obstructive sleep apnea 12/23/2013   Essential hypertension, benign    Hiatal hernia    Diverticulosis of colon 12/30/2010   Gastritis, chronic 12/30/2010   IBS (irritable bowel syndrome) 08/25/2010   GERD (gastroesophageal reflux disease) 08/25/2010    PCP: Lavell Bari LABOR, FNP   REFERRING PROVIDER: Shirly Carlin CROME PA-C  REFERRING DIAG:  [phone] (ICD-10-CM) - Acute pain of left knee  M25.462 (ICD-10-CM) - Effusion, left knee    Rationale for Evaluation and Treatment:  Rehabiliation  THERAPY DIAG: Acute pain in left knee, muscle weakness   ONSET DATE: 07/2023   SUBJECTIVE:  SUBJECTIVE STATEMENT:  Pt reports continued pain and swelling. She reports 5/10 pain entering clinic. She is compliant with HEP. States she is sore and stiff feeling after her exercises.  EVAL Pt reports L medial knee pain with insidious onset. She reports ongoing swelling and limited mobility to transfer from and chair, vehicle and negotiate steps. She has received an injection that lasted for 2 weeks but is now increased in pain. Her goal is to return to basic exercise and water aerobics.   NEXT MD VISIT: 03/12/24  PERTINENT HISTORY:  See above PMH  PAIN:  NPRS scale: 6/10 upon arrival Pain location: L Knee medial Pain description: constant, dull, achy, sharp, stiff Aggravating factors: weightbearing, steps,   Relieving factors: rest, meds   PRECAUTIONS: ,  None  RED FLAGS: None   WEIGHT BEARING RESTRICTIONS:  No  FALLS:  Has patient fallen in last 6 months? No   OCCUPATION:   Retired; [employer]   PLOF:  Independent  PATIENT GOALS:  Return to water aerobics and ADLs with less pain  OBJECTIVE:  Note: Objective measures were completed at Evaluation unless otherwise noted.  DIAGNOSTIC FINDINGS:  1. Degeneration of the lateral meniscus with horizontal tear of the body and posterior horn extending to the posterior root attachment. 2. Mild partial-thickness cartilage loss of the medial and lateral femorotibial compartments. 3. Small knee joint effusion. 4. Small ruptured Baker's cyst.  PATIENT SURVEYS:   Patient-Specific Activity Scoring Scheme  0 represents unable to perform. 10 represents able to perform at prior level. 0 1 2 3 4 5 6 7 8 9  10 (Date and Score)   Activity Eval     Steps 4     2.   Sit to stand transfers  6    3.  Vehicle transfer  4   4.    5.    Score 4.67    Total score = sum of the activity scores/number of activities Minimum detectable change (90%CI) for average score = 2 points Minimum detectable change (90%CI) for single activity score = 3 points     EDEMA:  Yes; L knee  POSTURE:  No postural limitations  GAIT: Assistive device utilized: None Level of assistance: Complete Independence Comments: Antalgic with dec stance time a LLE    LOWER EXTREMITY ROM:     Active  Right eval Left eval Right 02/28/24 Left 02/28/24  Hip flexion      Hip extension      Hip abduction      Hip adduction      Hip internal rotation      Hip external rotation      Knee flexion 125 100 130 120  Knee extension 10 8 10 8   Ankle dorsiflexion      Ankle plantarflexion      Ankle inversion      Ankle eversion       (Blank rows = not tested)   LOWER EXTREMITY MMT:    MMT Right eval Left eval  Hip flexion 4 4+  Hip extension    Hip abduction 4 4+  Hip adduction 4 4+  Hip internal rotation    Hip external rotation    Knee flexion 3+ 5  Knee extension 3+ 5  Ankle dorsiflexion    Ankle plantarflexion    Ankle  inversion    Ankle eversion     (Blank rows = not tested)     FUNCTIONAL TESTS:  5 times sit to stand: 22.28s  TREATMENT DATE:    02/28/24   Recumbent bike x 8 min L1  Leg press 35# 2 x 12 reps Leg extension 25# 3 x 10  Leg curls 20# 2 x 10  Hip abduction 45# 2 x 10  Hip adduction 50# 2 x 10  Pt education on increased protein intake, rest,  and cardiovascular and resistance exercise to improve strength, endurance, and recovery.  02/21/24  Recumbent bike x 10 min L1  Mini Squats 3 x 10 reps cues for form Fwd/lateral Step ups  2 x 10 reps LLE    Lateral band walk 3 x 10 feet red  Seated knee extension 3# 3 x 10 reps Seated leg curl 3 x 10 gtb Updated HEP and provided patient education on s/s related to meniscus tear.      Eval HEP creation and review with demonstration and trial set preformed, see below for details Selfcare:see education section below    PATIENT EDUCATION: Education details: HEP, PT plan of care, selfcare (Diet, exercise, and rest) Person educated: Patient Education method: Explanation, Demonstration, Verbal cues, and Handouts Education comprehension: verbalized understanding, further education recommended   HOME EXERCISE PROGRAM: Access Code: [email or web address] URL: [email or web address] Date: 02/21/2024 Prepared by: Massie Ada  Exercises - Supine Ankle Pumps in Elevation on Pillows  - 1 x daily - 7 x weekly - 3 sets - 10 reps - Supine Heel Slide with Strap  - 1 x daily - 7 x weekly - 3 sets - 10 reps - Mini Squat with Counter Support  - 1 x daily - 7 x weekly - 3 sets - 10 reps - Recumbent Bike  - 1 x daily - 7 x weekly - 3 sets - 10 reps - Side Stepping with Resistance at Ankles  - 1 x daily - 7 x weekly - 3 sets - 10 reps  ASSESSMENT:  CLINICAL IMPRESSION:  Pt tolerated session well without adverse reaction  or increase pain. Pt required verbal cues and demonstration to improve squat form. Pt will continue to benefit from skilled therapy in order to address impairments.    EVAL Patient referred to PT for left knee pain with effusion. Patient will benefit from skilled PT to improve overall function and to address impairments and limitations listed below.  OBJECTIVE IMPAIRMENTS: decreased activity tolerance for ADL's, difficulty walking, decreased balance, decreased endurance, decreased mobility, decreased ROM, decreased strength, impaired flexibility, impaired LE use, and pain.  ACTIVITY LIMITATIONS: bending, liftting, walking, standing, cleaning, community activity, driving, reaching, carry, occupation  PERSONAL FACTORS: see above PMH  also affecting patient's functional outcome.  REHAB POTENTIAL: Fair    CLINICAL DECISION MAKING: Stable/uncomplicated  EVALUATION COMPLEXITY: Low    GOALS: Short term PT Goals Target date: 03/12/2024   Pt will be I and compliant with HEP. Baseline: No HEP Goal status: New Pt will decrease pain by 25% overall Baseline:6/10 Goal status: New  Long term PT goals Target date: 03/26/2024   Pt will improve ROM to [facility] to improve functional mobility Baseline: limited 25% Goal status: New Pt will improve  strength to at least 4+/5 MMT to improve functional strength Baseline:3+/5 Goal status: New Pt will improve Patient specific functional scale (PSFS) to at least 7/10 to show improved function level Baseline: Goal status: New Pt will reduce pain to overall less than 3/10 with usual activity and work activity. Baseline:6/10 Goal status: New Pt will be able to ambulate community distances at least 1000 ft WNL gait pattern without complaints Baseline:antalgic Goal status:  New Pt will improve 5 times sit to stand time to less than 13 seconds to show improved strength, balance.  Baseline:22.28s Goal status: New  PLAN: PT FREQUENCY: 1-3 times per week    PT DURATION: 6-8 weeks  PLANNED INTERVENTIONS  97110-Therapeutic exercises, 97530- Therapeutic activity, [id number]- Neuromuscular re-education, 97535- Self Care, [id number]- Manual therapy, [id number]- Vasopneumatic device, and Patient/Family education  PLAN FOR NEXT SESSION: Initiate LE strength and Rom as tolerated   Massie Ada PT, DPT 02/28/24 1:34 PM   "

## 2024-03-11 ENCOUNTER — Ambulatory Visit

## 2024-03-12 ENCOUNTER — Ambulatory Visit: Admitting: Surgical

## 2024-03-13 ENCOUNTER — Ambulatory Visit: Admitting: Family

## 2024-04-21 ENCOUNTER — Ambulatory Visit: Payer: Self-pay | Admitting: Family

## 2024-05-01 ENCOUNTER — Ambulatory Visit: Payer: Self-pay

## 2024-06-13 ENCOUNTER — Encounter: Admitting: Adult Health

## 2024-06-13 ENCOUNTER — Inpatient Hospital Stay: Attending: Hematology and Oncology

## 2024-11-04 ENCOUNTER — Inpatient Hospital Stay: Admitting: Hematology and Oncology
